# Patient Record
Sex: Female | Born: 1980 | Race: Black or African American | Hispanic: No | Marital: Single | Smoking: Never smoker
Health system: Southern US, Community
[De-identification: ages and names within clinical notes are randomized; demographics above are authoritative.]

## PROBLEM LIST (undated history)

## (undated) DIAGNOSIS — G932 Benign intracranial hypertension: Secondary | ICD-10-CM

## (undated) DIAGNOSIS — N189 Chronic kidney disease, unspecified: Secondary | ICD-10-CM

## (undated) DIAGNOSIS — I10 Essential (primary) hypertension: Secondary | ICD-10-CM

---

## 2011-10-07 ENCOUNTER — Inpatient Hospital Stay
Admit: 2011-10-07 | Discharge: 2011-10-09 | Disposition: A | Discharge status: Home / Self Care | Payer: BLUE CROSS/BLUE SHIELD | Attending: Obstetrics & Gynecology | Admitting: Obstetrics & Gynecology

## 2011-10-07 DIAGNOSIS — N730 Acute parametritis and pelvic cellulitis: Secondary | ICD-10-CM

## 2011-10-07 LAB — METABOLIC PANEL, COMPREHENSIVE
A-G Ratio: 1.3 (ref 0.8–1.7)
ALT (SGPT): 21 U/L (ref 12.0–78.0)
AST (SGOT): 12 U/L — ABNORMAL LOW (ref 15–37)
Albumin: 4.1 g/dL (ref 3.4–5.0)
Alk. phosphatase: 109 U/L (ref 50–136)
Anion gap: 10 mmol/L (ref 3.0–18)
BUN/Creatinine ratio: 9 — ABNORMAL LOW (ref 12–20)
BUN: 12 MG/DL (ref 7.0–18)
Bilirubin, total: 0.4 MG/DL (ref 0.2–1.0)
CO2: 28 MMOL/L (ref 21–32)
Calcium: 9 MG/DL (ref 8.5–10.1)
Chloride: 100 MMOL/L (ref 100–108)
Creatinine: 1.33 MG/DL — ABNORMAL HIGH (ref 0.6–1.3)
GFR est AA: >60 mL/min/{1.73_m2} (ref >60)
GFR est non-AA: 50 mL/min/{1.73_m2} — ABNORMAL LOW (ref >60)
Globulin: 3.2 g/dL (ref 2.0–4.0)
Glucose: 104 MG/DL — ABNORMAL HIGH (ref 74–99)
Potassium: 3.1 MMOL/L — ABNORMAL LOW (ref 3.5–5.5)
Protein, total: 7.3 g/dL (ref 6.4–8.2)
Sodium: 138 MMOL/L (ref 136–145)

## 2011-10-07 LAB — CBC WITH AUTOMATED DIFF
ABS. BASOPHILS: 0 10*3/uL (ref 0.0–0.06)
ABS. EOSINOPHILS: 0 10*3/uL (ref 0.0–0.4)
ABS. LYMPHOCYTES: 2.2 10*3/uL (ref 0.9–3.6)
ABS. MONOCYTES: 1.6 10*3/uL — ABNORMAL HIGH (ref 0.05–1.2)
ABS. NEUTROPHILS: 20.6 10*3/uL — ABNORMAL HIGH (ref 1.8–8.0)
BASOPHILS: 0 % (ref 0–2)
EOSINOPHILS: 0 % (ref 0–5)
HCT: 34 % — ABNORMAL LOW (ref 35.0–45.0)
HGB: 11.5 g/dL — ABNORMAL LOW (ref 12.0–16.0)
LYMPHOCYTES: 9 % — ABNORMAL LOW (ref 21–52)
MCH: 26.4 PG (ref 24.0–34.0)
MCHC: 33.8 g/dL (ref 31.0–37.0)
MCV: 78 FL (ref 74.0–97.0)
MONOCYTES: 7 % (ref 3–10)
MPV: 9.5 FL (ref 9.2–11.8)
NEUTROPHILS: 84 % — ABNORMAL HIGH (ref 40–73)
PLATELET: 292 10*3/uL (ref 135–420)
RBC: 4.36 M/uL (ref 4.20–5.30)
RDW: 14.5 % (ref 11.6–14.5)
WBC: 24.5 10*3/uL — ABNORMAL HIGH (ref 4.6–13.2)

## 2011-10-07 LAB — URINALYSIS W/ RFLX MICROSCOPIC
Bilirubin: NEGATIVE
Blood: NEGATIVE
Glucose: NEGATIVE MG/DL
Ketone: NEGATIVE MG/DL
Leukocyte Esterase: NEGATIVE
Nitrites: NEGATIVE
Specific gravity: 1.02 (ref 1.003–1.030)
Urobilinogen: 1 EU/DL (ref 0.2–1.0)
pH (UA): 6 (ref 5.0–8.0)

## 2011-10-07 LAB — WET PREP

## 2011-10-07 LAB — URINE MICROSCOPIC ONLY
RBC: 0 /HPF (ref 0–5)
WBC: 0 /HPF (ref 0–4)

## 2011-10-07 LAB — LACTIC ACID: Lactic acid: 0.4 MMOL/L (ref 0.4–2.0)

## 2011-10-07 LAB — HCG URINE, QL: HCG urine, QL: NEGATIVE

## 2011-10-07 MED ORDER — ONDANSETRON (PF) 4 MG/2 ML INJECTION
4 mg/2 mL | INTRAMUSCULAR | Status: AC
Start: 2011-10-07 — End: 2011-10-07
  Administered 2011-10-07: 10:00:00 via INTRAVENOUS

## 2011-10-07 MED ORDER — IBUPROFEN 400 MG TAB
400 mg | ORAL | Status: DC | PRN
Start: 2011-10-07 — End: 2011-10-09
  Administered 2011-10-08 (×2): via ORAL

## 2011-10-07 MED ORDER — MORPHINE 4 MG/ML SYRINGE
4 mg/mL | INTRAMUSCULAR | Status: AC
Start: 2011-10-07 — End: 2011-10-07
  Administered 2011-10-07: 10:00:00 via INTRAVENOUS

## 2011-10-07 MED ORDER — GENTAMICIN 40 MG/ML IJ SOLN
40 mg/mL | INTRAMUSCULAR | Status: DC
Start: 2011-10-07 — End: 2011-10-09
  Administered 2011-10-08: 12:00:00 via INTRAVENOUS

## 2011-10-07 MED ORDER — HYDROMORPHONE (PF) 1 MG/ML IJ SOLN
1 mg/mL | Freq: Once | INTRAMUSCULAR | Status: AC
Start: 2011-10-07 — End: 2011-10-07
  Administered 2011-10-07: 15:00:00 via INTRAVENOUS

## 2011-10-07 MED ORDER — SODIUM CHLORIDE 0.9 % IV
INTRAVENOUS | Status: DC
Start: 2011-10-07 — End: 2011-10-09
  Administered 2011-10-07 – 2011-10-08 (×3): via INTRAVENOUS

## 2011-10-07 MED ORDER — SODIUM CHLORIDE 0.9% BOLUS IV
0.9 % | INTRAVENOUS | Status: AC
Start: 2011-10-07 — End: 2011-10-07
  Administered 2011-10-07: 08:00:00 via INTRAVENOUS

## 2011-10-07 MED ORDER — MORPHINE 4 MG/ML SYRINGE
4 mg/mL | INTRAMUSCULAR | Status: AC
Start: 2011-10-07 — End: 2011-10-07
  Administered 2011-10-07: 08:00:00 via INTRAVENOUS

## 2011-10-07 MED ORDER — ONDANSETRON (PF) 4 MG/2 ML INJECTION
4 mg/2 mL | INTRAMUSCULAR | Status: DC | PRN
Start: 2011-10-07 — End: 2011-10-09

## 2011-10-07 MED ORDER — SODIUM CHLORIDE 0.9 % IV PIGGY BACK
100 mg | Freq: Two times a day (BID) | INTRAVENOUS | Status: DC
Start: 2011-10-07 — End: 2011-10-09
  Administered 2011-10-07 – 2011-10-09 (×3): via INTRAVENOUS

## 2011-10-07 MED ORDER — ONDANSETRON (PF) 4 MG/2 ML INJECTION
4 mg/2 mL | INTRAMUSCULAR | Status: AC
Start: 2011-10-07 — End: 2011-10-07
  Administered 2011-10-07: 08:00:00 via INTRAVENOUS

## 2011-10-07 MED ORDER — OXYCODONE-ACETAMINOPHEN 5 MG-325 MG TAB
5-325 mg | ORAL | Status: DC | PRN
Start: 2011-10-07 — End: 2011-10-09
  Administered 2011-10-08 – 2011-10-09 (×3): via ORAL

## 2011-10-07 MED ORDER — IOHEXOL 240 MG/ML IV SOLN
240 mg iodine/mL | Freq: Once | INTRAVENOUS | Status: AC
Start: 2011-10-07 — End: 2011-10-07
  Administered 2011-10-07: 15:00:00 via ORAL

## 2011-10-07 MED ORDER — GENTAMICIN 40 MG/ML IJ SOLN
40 mg/mL | Freq: Once | INTRAMUSCULAR | Status: AC
Start: 2011-10-07 — End: 2011-10-07
  Administered 2011-10-07: 11:00:00 via INTRAVENOUS

## 2011-10-07 MED ORDER — ONDANSETRON (PF) 4 MG/2 ML INJECTION
4 mg/2 mL | INTRAMUSCULAR | Status: AC
Start: 2011-10-07 — End: 2011-10-07
  Administered 2011-10-07: 15:00:00 via INTRAVENOUS

## 2011-10-07 MED ORDER — IOVERSOL 320 MG/ML IV SYRINGE
320 mg iodine/mL | Freq: Once | INTRAVENOUS | Status: AC
Start: 2011-10-07 — End: 2011-10-07
  Administered 2011-10-07: 18:00:00 via INTRAVENOUS

## 2011-10-07 MED ORDER — PHARMACY GENTAMICIN NOTE
Freq: Once | Status: DC
Start: 2011-10-07 — End: 2011-10-09

## 2011-10-07 MED ORDER — SODIUM CHLORIDE 0.9 % IJ SYRG
INTRAMUSCULAR | Status: DC | PRN
Start: 2011-10-07 — End: 2011-10-09

## 2011-10-07 MED ORDER — SODIUM CHLORIDE 0.9 % IV
Freq: Once | INTRAVENOUS | Status: AC
Start: 2011-10-07 — End: 2011-10-07
  Administered 2011-10-07: 15:00:00 via INTRAVENOUS

## 2011-10-07 MED ORDER — PHARMACY GENTAMICIN NOTE
Freq: Once | Status: DC
Start: 2011-10-07 — End: 2011-10-07

## 2011-10-07 MED ORDER — CLINDAMYCIN 900 MG/6 ML IV
9006 mg/6 mL | INTRAVENOUS | Status: AC
Start: 2011-10-07 — End: 2011-10-07
  Administered 2011-10-07: 09:00:00 via INTRAVENOUS

## 2011-10-07 MED FILL — HYDROMORPHONE (PF) 1 MG/ML IJ SOLN: 1 mg/mL | INTRAMUSCULAR | Qty: 1

## 2011-10-07 MED FILL — CLINDAMYCIN 900 MG/6 ML IV: 900 mg/6 mL | INTRAVENOUS | Qty: 6

## 2011-10-07 MED FILL — OMNIPAQUE 240 MG IODINE/ML INTRAVENOUS SOLUTION: 240 mg iodine/mL | INTRAVENOUS | Qty: 50

## 2011-10-07 MED FILL — BD POSIFLUSH NORMAL SALINE 0.9 % INJECTION SYRINGE: INTRAMUSCULAR | Qty: 10

## 2011-10-07 MED FILL — ONDANSETRON (PF) 4 MG/2 ML INJECTION: 4 mg/2 mL | INTRAMUSCULAR | Qty: 2

## 2011-10-07 MED FILL — GENTAMICIN 40 MG/ML IJ SOLN: 40 mg/mL | INTRAMUSCULAR | Qty: 10

## 2011-10-07 MED FILL — MORPHINE 4 MG/ML SYRINGE: 4 mg/mL | INTRAMUSCULAR | Qty: 1

## 2011-10-07 MED FILL — SODIUM CHLORIDE 0.9 % IV: INTRAVENOUS | Qty: 1000

## 2011-10-07 MED FILL — DOXY-100 100 MG INTRAVENOUS SOLUTION: 100 mg | INTRAVENOUS | Qty: 100

## 2011-10-07 MED FILL — PHARMACY GENTAMICIN NOTE: Qty: 1

## 2011-10-07 MED FILL — SODIUM CHLORIDE 0.9 % IV: INTRAVENOUS | Qty: 100

## 2011-10-07 MED FILL — OPTIRAY 320 MG IODINE/ML INTRAVENOUS SYRINGE: 320 mg iodine/mL | INTRAVENOUS | Qty: 125

## 2011-10-07 NOTE — Progress Notes (Signed)
1340 received pt from ER. Pt A&O, but sleey, resp-clear, abdomen soft, non-tender, hypoactive bowel sounds (very faint), moves all extremities well, equal strength in both hands/feet, cap refill<3 secs; pt denies pain at this time.Call bell within reach. Cont. to monitor. Family at bedside.  1530 Pt unchanged except pt is sleeping. Cont to monitor.  1830-unchanged except pt's physician came and spoke with her about the plan for her care.

## 2011-10-07 NOTE — Progress Notes (Signed)
1435 back from CT scan was brought from ER to floor then to CT scan alert and oriented still drowsy she said was given pain medicine  While she was in ER.  1850 tolerated regular diet, did not ask anything for pain, looks comfortable.

## 2011-10-07 NOTE — ED Notes (Signed)
Patient has completed oral contrast

## 2011-10-07 NOTE — Progress Notes (Signed)
Lengthy conversation with pt and her father about lab results, [place], need for CT and to stay in hosp for 24 hour obs.  They voice understanding and agreement

## 2011-10-07 NOTE — Progress Notes (Signed)
Bedside and Verbal shift change report given to Loel Lofty, RN (oncoming nurse) by Redmond Pulling, RN   (offgoing nurse).  Report given with SBAR, Kardex, Intake/Output, MAR and Recent Results.

## 2011-10-07 NOTE — Other (Signed)
TRANSFER - OUT REPORT:    Verbal report given to [facility] Carrizozo Armstrong(name) on Bethany Keller  being transferred to 2200(unit) for routine progression of care       Report consisted of patient???s Situation, Background, Assessment and   Recommendations(SBAR).     Information from the following report(s) SBAR, ED Summary, Procedure Summary and MAR was reviewed with the receiving nurse.    Opportunity for questions and clarification was provided.

## 2011-10-07 NOTE — H&P (Signed)
Reesa Chew, MD  [facility]  [address]  [address]  [place], [place] [address]  [phone]       Gynecology Consult Note    Patient: Bethany Keller   Age:  31 y.o.  Consulting Physician: Felix Ahmadi, MD  Reason for Consult: Pelvic pain    Chief complaint:  Pain and vaginal bleeding    Bethany Keller is a 31yo G0 who presented to the ER with pelvic pain and vaginal bleeding. Reports that pain started 2-3 days ago and is bilateral. Having vaginal spotting. C/o some nausea but no vomiting. Denies fever or chills. Eating and drinking normally.     Past Medical History:  History reviewed. No pertinent past medical history.    Past Surgical History:  History reviewed. No pertinent past surgical history.    Family History:  History reviewed. No pertinent family history.    Social History:  History     Social History   ??? Marital Status: SINGLE     Spouse Name: N/A     Number of Children: N/A   ??? Years of Education: N/A     Social History Main Topics   ??? Smoking status: Never Smoker    ??? Smokeless tobacco: Not on file   ??? Alcohol Use: Yes      socially   ??? Drug Use:    ??? Sexually Active: Yes -- Female partner(s)     Birth Control/ Protection: Condom     Other Topics Concern   ??? Not on file     Social History Narrative   ??? No narrative on file     Gyn history: says she has a h/o PID in the past. No abnormal pap smears    Home Medications:  Prior to Admission medications    Not on File       Allergies:  No Known Allergies    Review of Systems  A comprehensive review of systems was negative except for that written in the History of Present Illness.  remainder of ten point review of systems reviewed with patient and negative      Physical Exam:     Visit Vitals   Item Reading   ??? BP 163/119   ??? Pulse 111   ??? Temp 99.7 ??F (37.6 ??C)   ??? Resp 16   ??? Ht 5\' 5"  (1.651 m)   ??? Wt 81.647 kg (180 lb)   ??? BMI 29.95 kg/m2   ??? SpO2 100%       Physical Exam:  General appearance: alert, cooperative, no distress, appears stated  age  Heart: RRR  Lungs: clear  Abd: diffusely tender across lower pelvis; no rebound or guarding  Pelvic: Uterine tenderness and cervical motion tenderness appreciated  Ext: nontender, no edema    Pelvic ultrasound:  No TOA but hydrasalpinx seen    Lab/Data Reviewed:  Recent Results (from the past 24 hour(s))   CBC WITH AUTOMATED DIFF    Collection Time    10/07/11  3:30 AM       Component Value Range    WBC 24.5 (*) 4.6 - 13.2 K/uL    RBC 4.36  4.20 - 5.30 M/uL    HGB 11.5 (*) 12.0 - 16.0 g/dL    HCT 91.4 (*) 78.2 - 45.0 %    MCV 78.0  74.0 - 97.0 FL    MCH 26.4  24.0 - 34.0 PG    MCHC 33.8  31.0 - 37.0  g/dL    RDW 16.1  09.6 - 04.5 %    PLATELET 292  135 - 420 K/uL    MPV 9.5  9.2 - 11.8 FL    NEUTROPHILS 84 (*) 40 - 73 %    LYMPHOCYTES 9 (*) 21 - 52 %    MONOCYTES 7  3 - 10 %    EOSINOPHILS 0  0 - 5 %    BASOPHILS 0  0 - 2 %    ABS. NEUTROPHILS 20.6 (*) 1.8 - 8.0 K/UL    ABS. LYMPHOCYTES 2.2  0.9 - 3.6 K/UL    ABS. MONOCYTES 1.6 (*) 0.05 - 1.2 K/UL    ABS. EOSINOPHILS 0.0  0.0 - 0.4 K/UL    ABS. BASOPHILS 0.0  0.0 - 0.06 K/UL    DF AUTOMATED     METABOLIC PANEL, COMPREHENSIVE    Collection Time    10/07/11  3:30 AM       Component Value Range    Sodium 138  136 - 145 MMOL/L    Potassium 3.1 (*) 3.5 - 5.5 MMOL/L    Chloride 100  100 - 108 MMOL/L    CO2 28  21 - 32 MMOL/L    Anion gap 10  3.0 - 18 mmol/L    Glucose 104 (*) 74 - 99 MG/DL    BUN 12  7.0 - 18 MG/DL    Creatinine 4.09 (*) 0.6 - 1.3 MG/DL    BUN/Creatinine ratio 9 (*) 12 - 20      GFR est AA >60  >60 ml/min/1.82m2    GFR est non-AA 50 (*) >60 ml/min/1.4m2    Calcium 9.0  8.5 - 10.1 MG/DL    Bilirubin, total 0.4  0.2 - 1.0 MG/DL    ALT 21  81.1 - 91.4 U/L    AST 12 (*) 15 - 37 U/L    Alk. phosphatase 109  50 - 136 U/L    Protein, total 7.3  6.4 - 8.2 g/dL    Albumin 4.1  3.4 - 5.0 g/dL    Globulin 3.2  2.0 - 4.0 g/dL    A-G Ratio 1.3  0.8 - 1.7     URINALYSIS W/ RFLX MICROSCOPIC    Collection Time    10/07/11  3:30 AM       Component Value Range    Color  YELLOW      Appearance CLEAR      Specific gravity 1.020  1.003 - 1.030      pH 6.0  5.0 - 8.0      Protein TRACE (*) NEGATIVE MG/DL    Glucose NEGATIVE   NEGATIVE MG/DL    Ketone NEGATIVE   NEGATIVE MG/DL    Bilirubin NEGATIVE   NEGATIVE    Blood NEGATIVE   NEGATIVE    Urobilinogen 1.0  0.2 - 1.0 EU/DL    Nitrites NEGATIVE   NEGATIVE    Leukocyte Esterase NEGATIVE   NEGATIVE   HCG URINE, QL    Collection Time    10/07/11  3:30 AM       Component Value Range    HCG urine, Ql. NEGATIVE   NEGATIVE   WET PREP    Collection Time    10/07/11  3:30 AM       Component Value Range    Specimen Description: CERVICAL      Special Requests: NO SPECIAL REQUESTS      Wet prep NO YEAST,TRICHOMONAS OR CLUE CELLS  NOTED      Report Status 10/07/2011 FINAL     URINE MICROSCOPIC ONLY    Collection Time    10/07/11  3:30 AM       Component Value Range    WBC 0 to 3  0 - 4 /HPF    RBC 0  0 - 5 /HPF    Epithelial cells 1+  0 - 5 /LPF   LACTIC ACID, PLASMA    Collection Time    10/07/11  4:40 AM       Component Value Range    Lactic acid 0.4  0.4 - 2.0 MMOL/L   CULTURE, BLOOD    Collection Time    10/07/11  4:40 AM       Component Value Range    Specimen Description: BLOOD      Special Requests: NO SPECIAL REQUESTS      Culture result: NO GROWTH AFTER 3 HOURS      Report Status PENDING     CULTURE, BLOOD    Collection Time    10/07/11  4:45 AM       Component Value Range    Specimen Description: BLOOD      Special Requests: NO SPECIAL REQUESTS      Culture result: NO GROWTH AFTER 3 HOURS      Report Status PENDING             Assessment/Plan     Pelvic pain and elevated WBC count  C/w possible PID. Gent and clinda given. Pelvic US without TOA. Plan for CT abd/pelvis to r/o appendicitis. If no appy, will admit to gyn for treatment of presumed PID.    Reesa Chew, MD  Oct 07, 2011

## 2011-10-07 NOTE — ED Notes (Signed)
Sleeping with friend at the bedside."feels better."

## 2011-10-07 NOTE — Other (Signed)
TRANSFER - IN REPORT:    Verbal report received from Milton Ferguson (name) on Bethany Keller  being received from ER(unit) for routine progression of care      Report consisted of patient???s Situation, Background, Assessment and   Recommendations(SBAR).     Information from the following report(s) SBAR, Kardex, Intake/Output, MAR and Recent Results was reviewed with the receiving nurse.    Opportunity for questions and clarification was provided.      Assessment completed upon patient???s arrival to unit and care assumed.

## 2011-10-07 NOTE — Progress Notes (Signed)
Clcr = 65 ml/min; calculated 5 mg/kg dose based on patient's adjusted body weight = 335 mg intravenously starting tomorrow since the patient already received a 350 mg dose in the ED this morning.  Ordered a random gentamicin level for 09 Oct 2011.

## 2011-10-07 NOTE — Progress Notes (Signed)
GYN PROGRESS NOTE    S:  Pt denies pain.  Feeling better.  Voiding well.  Hungry and ate a sub.  O:  Patient Vitals for the past 4 hrs:   Temp Pulse Resp BP SpO2   10/07/11 1524 98 ??F (36.7 ??C) 97  20  168/80 mmHg 97 %             Intake/Output Summary (Last 24 hours) at 10/07/11 1810  Last data filed at 10/07/11 1749   Gross per 24 hour   Intake 558.33 ml   Output    500 ml   Net  58.33 ml        A:PID and hydrosalpinx.  On antibiotics.  Afebrile        P: Continue Regular diet and IV antibiotics.  Recheck CBC in am and watch for resolution of abdominal and pelvic pain.  Questions answered.       Kyung Rudd, MD  Oct 07, 2011

## 2011-10-07 NOTE — ED Provider Notes (Signed)
HPI Comments: 31 yo F presents to the ED c/o vaginal bleeding and discharge since yesterday.  Pt also states that she has had nausea and severe suprapubic pain.  Pt's LMP:  3 weeks ago.  Pt state that she had PID in the past, and that her sx's feel the similar to then.  Pt denies fever, chills, V/D, CP, SOB, HA, dizziness, and any other sx or complaints.       History reviewed. No pertinent past medical history.     History reviewed. No pertinent past surgical history.      History reviewed. No pertinent family history.     History     Social History   ??? Marital Status: SINGLE     Spouse Name: N/A     Number of Children: N/A   ??? Years of Education: N/A     Occupational History   ??? Not on file.     Social History Main Topics   ??? Smoking status: Never Smoker    ??? Smokeless tobacco: Not on file   ??? Alcohol Use: Yes      socially   ??? Drug Use:    ??? Sexually Active: Yes -- Female partner(s)     Birth Control/ Protection: Condom     Other Topics Concern   ??? Not on file     Social History Narrative   ??? No narrative on file                  ALLERGIES: Review of patient's allergies indicates no known allergies.      Review of Systems   Constitutional: Negative.  Negative for fever, chills and diaphoresis.   HENT: Negative.  Negative for congestion, sore throat, trouble swallowing, neck pain and neck stiffness.    Eyes: Negative.  Negative for photophobia, pain and redness.   Respiratory: Negative.  Negative for cough, chest tightness, shortness of breath and wheezing.    Cardiovascular: Negative.  Negative for chest pain and palpitations.   Gastrointestinal: Positive for nausea and abdominal pain. Negative for vomiting, diarrhea and blood in stool.   Genitourinary: Positive for vaginal bleeding and vaginal discharge. Negative for dysuria, frequency and difficulty urinating.   Musculoskeletal: Negative.  Negative for myalgias and arthralgias.   Skin: Negative.    Neurological: Negative.  Negative for dizziness, tremors,  seizures, syncope, speech difficulty, light-headedness and headaches.   Hematological: Negative.    Psychiatric/Behavioral: Negative.  Negative for confusion. The patient is not nervous/anxious.    All other systems reviewed and are negative.        Filed Vitals:    10/07/11 0228   BP: 181/126   Pulse: 111   Temp: 99.7 ??F (37.6 ??C)   Resp: 16   Height: 5\' 5"  (1.651 m)   Weight: 81.647 kg (180 lb)   SpO2: 98%            Physical Exam   Constitutional: She appears well-developed. No distress.   HENT:   Head: Atraumatic.   Right Ear: External ear normal.   Left Ear: External ear normal.   Mouth/Throat: Oropharynx is clear and moist.   Eyes: Conjunctivae and EOM are normal.   Neck: Normal range of motion. Neck supple. No tracheal deviation present.   Cardiovascular: Normal rate, regular rhythm and normal heart sounds.    Pulmonary/Chest: Effort normal and breath sounds normal. No stridor. No respiratory distress. She has no wheezes. She has no rales.   Abdominal: Soft. Bowel sounds  are normal. There is tenderness in the suprapubic area. There is rebound and guarding.   Genitourinary: Uterus is tender. Cervix exhibits motion tenderness (severe). Vaginal discharge (yellow) found.        Mild ovarian tenderness bilaterally.     Musculoskeletal: Normal range of motion. She exhibits no edema and no tenderness.   Lymphadenopathy:     She has no cervical adenopathy.   Neurological: She is alert. No cranial nerve deficit. She exhibits normal muscle tone. Coordination normal.   Skin: Skin is warm and dry. No rash noted. She is not diaphoretic.   Psychiatric: She has a normal mood and affect. Thought content normal.        MDM     Differential Diagnosis; Clinical Impression; Plan:     4:16 AM:  Suspect PID, r/o ectopic, ovarian rupture or cyst.  5:01 AM WBC UP, TALKED WITH DR GOLPIRA WHO WILL SEE PT AFTER HER 1ST CASE THIS AM, TALKED WITH CT, THEY CANT CALL TECH IN NOW BUT SAY WILL GET HERE AT 0630 AND MAKE THIS PT 1ST CASE, R/O  TOA. DR Rhae Hammock SUGGESTS CLEOCIN AND GENTAMICIN ("PHARMACY TO DOSE") GAVE MORPHINE, REPEATING NOW. SHE FEELS PT WILL BE INPT.  5:52 AM MORPHINE DELAYED, [place] SHOULD BE SOON,   6:23 AM PAIN BETTER, ABX GOING, [place] PENDING, DR DICKSON WILL ASSUME CARE AND CALL DR Rhae Hammock AFTER [place]  Amount and/or Complexity of Data Reviewed:   Clinical lab tests:  Ordered and reviewed   Decide to obtain previous medical records or to obtain history from someone other than the patient:  Yes   Obtain history from someone other than the patient:  Yes   Review and summarize past medical records:  Yes   Discuss the patient with another provider:  Yes  Risk of Significant Complications, Morbidity, and/or Mortality:   Presenting problems:  Moderate  Diagnostic procedures:  Moderate  Management options:  Moderate  Progress:   Patient progress:  Stable      Procedures    -------------------------------------------------------------------------------------------------------------------     EKG INTERPRETATIONS:      RESULTS:       CONSULTATIONS:      PROGRESS NOTES:      SCRIBE ATTESTATION STATEMENT:  4:17 AM  Provider documentation written by: Avelina Laine  Acting as a scribe for Dr. Felix Ahmadi, MD ED Provider     I have reviewed the information recorded by the scribe and agree with its contents.    -------------------------------------------------------------------------------------------------------------------    I have reviewed the information recorded by the scribe and agree with its contents. Reymond Maynez  IN ADDITION, I HAVE REVIEWED ANY PAST-FAMILY-SOCIAL HX (AS PER "BEST PRACTICE"), ALL PTs WITH ANY ELEVATED BP READINGS HAVE BEEN TOLD TO FOLLOW WITH THEIR PCP, ALL PTS TOLD MAKE SURE LEAVE PROPER PHONE NUMBERS, AND ALL CRITICAL CARE TIMES GIVEN (IF APPLICAPLE) DO NOT INCLUDE ANY PROCEDURE TIME IN THE ESTIMATE.  BP UP-TOLD F/U PCP  Diagnosis:   1. PID (acute pelvic inflammatory disease)    2. Pelvic pain in female    3. Elevated BP           Disposition: PER DR DICKSON    Follow-up Information     None          Patient's Medications    No medications on file

## 2011-10-08 LAB — CBC WITH AUTOMATED DIFF
ABS. BASOPHILS: 0 10*3/uL (ref 0.0–0.06)
ABS. EOSINOPHILS: 0.1 10*3/uL (ref 0.0–0.4)
ABS. LYMPHOCYTES: 2.4 10*3/uL (ref 0.9–3.6)
ABS. MONOCYTES: 0.9 10*3/uL (ref 0.05–1.2)
ABS. NEUTROPHILS: 11.6 10*3/uL — ABNORMAL HIGH (ref 1.8–8.0)
BASOPHILS: 0 % (ref 0–2)
EOSINOPHILS: 1 % (ref 0–5)
HCT: 31.5 % — ABNORMAL LOW (ref 35.0–45.0)
HGB: 10.6 g/dL — ABNORMAL LOW (ref 12.0–16.0)
LYMPHOCYTES: 16 % — ABNORMAL LOW (ref 21–52)
MCH: 26.5 PG (ref 24.0–34.0)
MCHC: 33.7 g/dL (ref 31.0–37.0)
MCV: 78.8 FL (ref 74.0–97.0)
MONOCYTES: 6 % (ref 3–10)
MPV: 10.4 FL (ref 9.2–11.8)
NEUTROPHILS: 77 % — ABNORMAL HIGH (ref 40–73)
PLATELET: 281 10*3/uL (ref 135–420)
RBC: 4 M/uL — ABNORMAL LOW (ref 4.20–5.30)
RDW: 14.5 % (ref 11.6–14.5)
WBC: 15 10*3/uL — ABNORMAL HIGH (ref 4.6–13.2)

## 2011-10-08 LAB — CHLAMYDIA/GC PCR
Chlamydia amplified: NEGATIVE
N. gonorrhea, amplified: NEGATIVE

## 2011-10-08 MED ORDER — METOPROLOL TARTRATE 5 MG/5 ML IV SOLN
5 mg/ mL | INTRAVENOUS | Status: DC | PRN
Start: 2011-10-08 — End: 2011-10-09
  Administered 2011-10-08 – 2011-10-09 (×3): via INTRAVENOUS

## 2011-10-08 MED ORDER — CLONIDINE 0.1 MG TAB
0.1 mg | Freq: Three times a day (TID) | ORAL | Status: DC
Start: 2011-10-08 — End: 2011-10-09
  Administered 2011-10-09: 01:00:00 via ORAL

## 2011-10-08 MED ORDER — METOPROLOL TARTRATE 25 MG TAB
25 mg | Freq: Two times a day (BID) | ORAL | Status: DC
Start: 2011-10-08 — End: 2011-10-09
  Administered 2011-10-08 – 2011-10-09 (×3): via ORAL

## 2011-10-08 MED ORDER — LABETALOL 5 MG/ML IV SOLN
5 mg/mL | Freq: Once | INTRAVENOUS | Status: AC
Start: 2011-10-08 — End: 2011-10-08
  Administered 2011-10-08: 07:00:00 via INTRAVENOUS

## 2011-10-08 MED FILL — IBUPROFEN 400 MG TAB: 400 mg | ORAL | Qty: 1

## 2011-10-08 MED FILL — METOPROLOL TARTRATE 5 MG/5 ML IV SOLN: 5 mg/ mL | INTRAVENOUS | Qty: 5

## 2011-10-08 MED FILL — LABETALOL 5 MG/ML IV SOLN: 5 mg/mL | INTRAVENOUS | Qty: 20

## 2011-10-08 MED FILL — METOPROLOL TARTRATE 25 MG TAB: 25 mg | ORAL | Qty: 1

## 2011-10-08 MED FILL — OXYCODONE-ACETAMINOPHEN 5 MG-325 MG TAB: 5-325 mg | ORAL | Qty: 1

## 2011-10-08 MED FILL — SODIUM CHLORIDE 0.9 % IV: INTRAVENOUS | Qty: 100

## 2011-10-08 MED FILL — DOXY-100 100 MG INTRAVENOUS SOLUTION: 100 mg | INTRAVENOUS | Qty: 100

## 2011-10-08 NOTE — Progress Notes (Signed)
0128: BP 185/123.  Rechecked an hour later, BP 188/125.  Paged Dr. Durwin Nora, awaits a call back.               Pt also c/o of headache, motrin 400 mg.    0245:  Dr. Durwin Nora ordered 10 mg Labetalol.  0300:  Med administered, pt A&O, no sign of distress.  Will continue to monitor.

## 2011-10-08 NOTE — Consults (Signed)
[id number]

## 2011-10-08 NOTE — Consults (Signed)
[facility]                  [address], [place], [place]  [address]                                CONSULTATION REPORT    PATIENT:      Bethany Keller, Bethany Keller  MRN:              [id number]     ADMITTED:   10/07/2011  BILLING:          [id number]    CONSULTED:  10/08/2011  LOCATION:     [id number]  ATTENDING:    Cherlyn Roberts, MD  CONSULTINGAdonis Brook, MD        MEDICINE CONSULTATION    REASON FOR CONSULTATION: Hypertension.    REFERRING PHYSICIAN: Guss Bunde, MD    CONSULTING PHYSICIAN: Adonis Brook, MD    HISTORY OF PRESENT ILLNESS: The patient was admitted for pelvic  inflammatory disease. She is still in significant pain, not using pain  medications. She has not been vomiting. She is eating well, according to  Dr. Durwin Nora and the patient. The patient has a history of elevated blood  pressure with systolics being in the 150, according to history. Plan per  Dr. Durwin Nora is to monitor one more day and then to discharge home on p.o.  antibiotics. The patient has an appointment for primary care on Friday  10/10/2011. Boyfriend is at the bedside and states that he is supportive  and will make sure that she makes it to the appointment. She is still  describing her pain as 7/10 in the left lower quadrant. Per CT of the  abdomen and pelvis, she has right greater than left fluid distention. No  visualized tubo-ovarian abscess was present. The patient states that she is  reliable, wishes to follow up and agrees to get an outpatient blood  pressure cuff to monitor her blood pressure twice a day.    PRIMARY CARE PHYSICIAN: The patient has an appointment. She cannot remember  the name of the physician. However, this appointment is on Friday,  10/10/2011.    PAST MEDICAL HISTORY: Previous history of PID. By history, elevated blood  pressure up to 150 systolic.    CURRENT MEDICATIONS: None.    ALLERGIES: NO KNOWN DRUG ALLERGIES.    SOCIAL HISTORY: She is independent  for ADLs and IADLs. She denies tobacco.  She drinks alcohol socially. She denies street drugs.    CODE STATUS: FULL CODE.    FAMILY HISTORY: Positive for hypertension in her grandmother also with  diabetes.    REVIEW OF SYSTEMS  GENERAL: Last night, she had a headache but currently is headache free.  HEENT: No change in vision or hearing.  RESPIRATORY: No cough, sputum production, wheezing or dyspnea on exertion.  CARDIOVASCULAR: No chest pain or dizziness. No palpitations.  GASTROINTESTINAL: Positive for abdominal pain in the left lower quadrant.  Her nausea is resolving. She has been able to eat a sub sandwich.  GENITOURINARY/RECTAL: The patient denies any dyspareunia.  MUSCULOSKELETAL: No arthralgias or myalgias.  NEUROLOGIC: She has no focalized or generalized weakness. Positive headache  last night but has resolved. No changes in vision, hearing or gait.    PHYSICAL EXAMINATION  VITAL SIGNS: Blood pressure 154/110, pulse is 90 but has been high as 110.  Temperature is 97.8, respirations are 18,  O2 saturation is 97% on 2 L nasal  cannula. Weight is 81 kg.  GENERAL: She is in mild distress due to pain.  HEENT: Within normal limits.  NECK: No JVD, thyromegaly, bruits or lymphadenopathy.  CHEST: Clear to auscultation.  HEART: Regular rate and rhythm although borderline tachycardic hovering  around 100. No gallops, rubs or murmurs.  ABDOMEN: Soft, mild tenderness with very mild guarding in the left lower  quadrant. Bowel sounds are regular. No hepatosplenomegaly is present.  GENITOURINARY/RECTAL: Deferred.  EXTREMITIES: No clubbing, cyanosis or edema.    LABORATORY DATA: White blood cell count is 15.0, hemoglobin is 10.6,  hematocrit 31.5, platelets 281. Differential is unremarkable. Basic  metabolic panel and liver function tests are essentially within normal  limits with the exception of mildly low potassium at 3.1. Lactic acid level  0.4. HCG urine is negative. CAT scan shows results as above.    ASSESSMENT AND  PLAN  1. Hypertension. The likelihood is that she has underlying hypertension  that is moderate with acute exacerbation due to elevation of pain due to  her tachycardia. We will start her on metoprolol 12.5 mg p.o. b.i.d. with  Lopressor 2.5 mg IV every 4 hours as needed. The patient likely needs to  continue with blood pressure medication with close followup. She has been  given instructions to obtain blood pressure cuff and to follow up with her  primary care physician. She currently is symptom free in terms of headache  so this should not preclude her from discharge. We will follow tomorrow and  the anticipation is that the patient should be able to discharge home.  2. Tubal ovarian abscess management as per OB/GYN, Dr. Durwin Nora. Continue with  antibiotics. Per her notes, anticipation again is that she will likely be  able to discharge tomorrow.  3. Code status is FULL CODE.    I would like to thank Dr. Durwin Nora for allowing me to provide care for the  patient.                                                                   Adonis Brook, MD        RB:wmx  D: 10/08/2011  9:35 A T: 10/08/2011 10:27 A  Job #:  [id number]  CScriptDoc #:  [id number]  cc:   Adonis Brook, MD        Kyung Rudd, MD        Cherlyn Roberts, MD

## 2011-10-08 NOTE — Progress Notes (Signed)
GYN PROGRESS NOTE    S:  Pt states still with significant pain. Describes it as a 7/10. Using po pain meds. No vomiting and eating well.  Voiding well.    O:  Patient Vitals for the past 4 hrs:   Temp Pulse Resp BP SpO2   10/08/11 0657 97.8 ??F (36.6 ??C) 90  18  154/110 mmHg 97 %   10/08/11 0526 - - - - 95 %   10/08/11 0444 98.9 ??F (37.2 ??C) 102  20  157/110 mmHg 90 %             Intake/Output Summary (Last 24 hours) at 10/08/11 0826  Last data filed at 10/08/11 0658   Gross per 24 hour   Intake 1750.84 ml   Output   1100 ml   Net 650.84 ml      Recent Results (from the past 12 hour(s))   CBC WITH AUTOMATED DIFF    Collection Time    10/08/11  5:30 AM       Component Value Range    WBC 15.0 (*) 4.6 - 13.2 K/uL    RBC 4.00 (*) 4.20 - 5.30 M/uL    HGB 10.6 (*) 12.0 - 16.0 g/dL    HCT 16.1 (*) 09.6 - 45.0 %    MCV 78.8  74.0 - 97.0 FL    MCH 26.5  24.0 - 34.0 PG    MCHC 33.7  31.0 - 37.0 g/dL    RDW 04.5  40.9 - 81.1 %    PLATELET 281  135 - 420 K/uL    MPV 10.4  9.2 - 11.8 FL    NEUTROPHILS 77 (*) 40 - 73 %    LYMPHOCYTES 16 (*) 21 - 52 %    MONOCYTES 6  3 - 10 %    EOSINOPHILS 1  0 - 5 %    BASOPHILS 0  0 - 2 %    ABS. NEUTROPHILS 11.6 (*) 1.8 - 8.0 K/UL    ABS. LYMPHOCYTES 2.4  0.9 - 3.6 K/UL    ABS. MONOCYTES 0.9  0.05 - 1.2 K/UL    ABS. EOSINOPHILS 0.1  0.0 - 0.4 K/UL    ABS. BASOPHILS 0.0  0.0 - 0.06 K/UL    DF AUTOMATED           A: PID s/p one day of IV antibiotics.  BP elevated with h/o elevated bp.        P: WBC decreased.  Would continue on IV antibiotics as still with significant pain.  Consulted Medicine for BP control.  Blood cultures negative and hopefully will go home in AM on po antibiotics.       Kyung Rudd, MD  Oct 08, 2011

## 2011-10-08 NOTE — Progress Notes (Signed)
Chaplain completed the initial Spiritual Assessment of the patient, and offered Pastoral Care, see flow sheets for interventions. Patient does not have any religious/cultural needs that will affect patient???s preferences in health care. Chaplains will continue to follow and will provide pastoral care on an as needed/requested basis.   Patient unable to communicate at this time .  Dewaine Oats   Spiritual Care   [phone]

## 2011-10-08 NOTE — Other (Signed)
Bedside and Verbal shift change report given to Kristina Goodwyn, RN (oncoming nurse) by Kemisola C Adebambo, RN   (offgoing nurse).  Report given with SBAR, Kardex, Intake/Output, MAR and Recent Results.

## 2011-10-09 DIAGNOSIS — N730 Acute parametritis and pelvic cellulitis: Principal | ICD-10-CM

## 2011-10-09 LAB — CBC WITH AUTOMATED DIFF
ABS. BASOPHILS: 0 10*3/uL (ref 0.0–0.06)
ABS. EOSINOPHILS: 0.2 10*3/uL (ref 0.0–0.4)
ABS. LYMPHOCYTES: 2 10*3/uL (ref 0.9–3.6)
ABS. MONOCYTES: 1 10*3/uL (ref 0.05–1.2)
ABS. NEUTROPHILS: 10.5 10*3/uL — ABNORMAL HIGH (ref 1.8–8.0)
BASOPHILS: 0 % (ref 0–2)
EOSINOPHILS: 2 % (ref 0–5)
HCT: 32.3 % — ABNORMAL LOW (ref 35.0–45.0)
HGB: 10.9 g/dL — ABNORMAL LOW (ref 12.0–16.0)
LYMPHOCYTES: 15 % — ABNORMAL LOW (ref 21–52)
MCH: 26.5 PG (ref 24.0–34.0)
MCHC: 33.7 g/dL (ref 31.0–37.0)
MCV: 78.6 FL (ref 74.0–97.0)
MONOCYTES: 7 % (ref 3–10)
MPV: 10 FL (ref 9.2–11.8)
NEUTROPHILS: 76 % — ABNORMAL HIGH (ref 40–73)
PLATELET: 292 10*3/uL (ref 135–420)
RBC: 4.11 M/uL — ABNORMAL LOW (ref 4.20–5.30)
RDW: 14.3 % (ref 11.6–14.5)
WBC: 13.7 10*3/uL — ABNORMAL HIGH (ref 4.6–13.2)

## 2011-10-09 MED ORDER — CLONIDINE 0.1 MG TAB
0.1 mg | ORAL_TABLET | Freq: Three times a day (TID) | ORAL | Status: DC
Start: 2011-10-09 — End: 2012-08-05

## 2011-10-09 MED ORDER — OXYCODONE-ACETAMINOPHEN 5 MG-325 MG TAB
5-325 mg | ORAL_TABLET | ORAL | Status: DC | PRN
Start: 2011-10-09 — End: 2012-08-05

## 2011-10-09 MED ORDER — LEVOFLOXACIN 500 MG TAB
500 mg | ORAL_TABLET | Freq: Every day | ORAL | Status: DC
Start: 2011-10-09 — End: 2012-08-05

## 2011-10-09 MED ORDER — CLONIDINE 0.1 MG TAB
0.1 mg | Freq: Four times a day (QID) | ORAL | Status: DC | PRN
Start: 2011-10-09 — End: 2011-10-09
  Administered 2011-10-09: 10:00:00 via ORAL

## 2011-10-09 MED ORDER — METRONIDAZOLE 500 MG TAB
500 mg | ORAL_TABLET | Freq: Two times a day (BID) | ORAL | Status: DC
Start: 2011-10-09 — End: 2012-08-05

## 2011-10-09 MED ORDER — CLONIDINE 0.1 MG TAB
0.1 mg | ORAL | Status: AC
Start: 2011-10-09 — End: 2011-10-09
  Administered 2011-10-09: 14:00:00 via ORAL

## 2011-10-09 MED FILL — CLONIDINE 0.1 MG TAB: 0.1 mg | ORAL | Qty: 1

## 2011-10-09 MED FILL — DOXY-100 100 MG INTRAVENOUS SOLUTION: 100 mg | INTRAVENOUS | Qty: 100

## 2011-10-09 MED FILL — SODIUM CHLORIDE 0.9 % IV: INTRAVENOUS | Qty: 100

## 2011-10-09 MED FILL — METOPROLOL TARTRATE 5 MG/5 ML IV SOLN: 5 mg/ mL | INTRAVENOUS | Qty: 5

## 2011-10-09 MED FILL — METOPROLOL TARTRATE 25 MG TAB: 25 mg | ORAL | Qty: 1

## 2011-10-09 MED FILL — OXYCODONE-ACETAMINOPHEN 5 MG-325 MG TAB: 5-325 mg | ORAL | Qty: 1

## 2011-10-09 NOTE — Other (Signed)
Bedside and Verbal shift change report given to Rica Mast (oncoming nurse) by Percell Miller (offgoing nurse) with York Pellant

## 2011-10-09 NOTE — Progress Notes (Addendum)
Pt BP  is 169/118. Dr Alma Downs is notified and she states she will order something for her.will continue monitoring. Pt is sleeping at this time and asymptomatic.

## 2011-10-09 NOTE — Progress Notes (Signed)
Medicine Progress Note    Patient: Bethany Keller   Age:  31 y.o.  DOA: 10/07/2011   Admit Dx / CC: Hydrosalpinx  Hydrosalpinx  LOS:  LOS: 2 days     Subjective:   Patient seen and examined. 10/09/11  Patient discharged by GYN, she insisting in going home  BP still poorly controlled, she stays it is because she is very uncomfortable in the hospital    Objective:     Visit Vitals   Item Reading   ??? BP 157/111   ??? Pulse 92   ??? Temp 98.6 ??F (37 ??C)   ??? Resp 18   ??? Ht 5\' 5"  (1.651 m)   ??? Wt 81.647 kg (180 lb)   ??? BMI 29.95 kg/m2   ??? SpO2 99%       Physical Exam:  General appearance: alert, cooperative, no distress, appears stated age  Head: Normocephalic, without obvious abnormality, atraumatic  Neck: supple, trachea midline  Lungs: clear to auscultation bilaterally  Heart: regular rate and rhythm, S1, S2 normal, no murmur, click, rub or gallop  Abdomen: soft, mild tenderness. Bowel sounds normal. No masses,  no organomegaly  Extremities: extremities normal, atraumatic, no cyanosis or edema  Skin: Skin color, texture, turgor normal. No rashes or lesions  Neurologic: Grossly normal    Intake and Output:  Current Shift:  05/30 0700 - 05/30 1859  In: 240 [P.O.:240]  Out: 750 [Urine:750]  Last three shifts:  05/28 1900 - 05/30 0659  In: 6934.2 [P.O.:2280; I.V.:4654.2]  Out: 2600 [Urine:2600]    Lab/Data Reviewed:  Recent Results (from the past 24 hour(s))   CBC WITH AUTOMATED DIFF    Collection Time    10/09/11  5:50 AM       Component Value Range    WBC 13.7 (*) 4.6 - 13.2 K/uL    RBC 4.11 (*) 4.20 - 5.30 M/uL    HGB 10.9 (*) 12.0 - 16.0 g/dL    HCT 16.1 (*) 09.6 - 45.0 %    MCV 78.6  74.0 - 97.0 FL    MCH 26.5  24.0 - 34.0 PG    MCHC 33.7  31.0 - 37.0 g/dL    RDW 04.5  40.9 - 81.1 %    PLATELET 292  135 - 420 K/uL    MPV 10.0  9.2 - 11.8 FL    NEUTROPHILS 76 (*) 40 - 73 %    LYMPHOCYTES 15 (*) 21 - 52 %    MONOCYTES 7  3 - 10 %    EOSINOPHILS 2  0 - 5 %    BASOPHILS 0  0 - 2 %    ABS. NEUTROPHILS 10.5 (*) 1.8 - 8.0 K/UL     ABS. LYMPHOCYTES 2.0  0.9 - 3.6 K/UL    ABS. MONOCYTES 1.0  0.05 - 1.2 K/UL    ABS. EOSINOPHILS 0.2  0.0 - 0.4 K/UL    ABS. BASOPHILS 0.0  0.0 - 0.06 K/UL    DF AUTOMATED         Medications Reviewed:  Current Facility-Administered Medications   Medication Dose Route Frequency   ??? cloNIDine (CATAPRES) tablet 0.1 mg  0.1 mg Oral Q6H PRN   ??? cloNIDine (CATAPRES) tablet 0.1 mg  0.1 mg Oral NOW   ??? metoprolol (LOPRESSOR) tablet 12.5 mg  12.5 mg Oral Q12H   ??? metoprolol (LOPRESSOR) injection 2.5 mg  2.5 mg IntraVENous Q4H PRN   ??? 0.9% sodium chloride infusion  125 mL/hr IntraVENous CONTINUOUS   ??? gentamicin (GARAMYCIN) 335 mg in 0.9% sodium chloride 100 mL IVPB  335 mg IntraVENous Q24H   ??? sodium chloride (NS) flush 5-10 mL  5-10 mL IntraVENous PRN   ??? doxycycline (VIBRAMYCIN) 100 mg in 0.9% sodium chloride (MBP/ADV) 100 mL IVPB  100 mg IntraVENous Q12H   ??? ibuprofen (MOTRIN) tablet 400 mg  400 mg Oral Q4H PRN   ??? oxyCODONE-acetaminophen (PERCOCET) 5-325 mg per tablet 1 Tab  1 Tab Oral Q4H PRN   ??? ondansetron (ZOFRAN) injection 4 mg  4 mg IntraVENous Q4H PRN       Assessment/Plan   Principal Problem:   *Acute parametritis and pelvic cellulitis (10/09/2011)  Active Problems:     Hypertension (10/09/2011)      Patient needs better BP control, she states that she has been discharge and wants to go home  She states that she will see her PCP tomorrow   I will give her prescriptions for Clonidine 0.1 mg every 8 hours, her BP has responded well to this medication today   She understand the importance of adequate BP control      Maple Mirza, MD  Oct 09, 2011

## 2011-10-09 NOTE — Discharge Summary (Signed)
Gynecology Progress Note          Name: Bethany Keller   Medical Record Number: [id number]   Date of Birth: 11/18/1980  Today's Date: 10/09/2011  .  Admit date: 10/07/2011    Discharge Date: 10/09/2011    Attending Physician: Reesa Chew, MD    Admission Diagnoses: Hydrosalpinx  Hydrosalpinx    Discharge Diagnoses:     Problem List as of 10/09/2011  Date Reviewed: 11-07-11      Codes Class Noted - Resolved    *Acute parametritis and pelvic cellulitis 614.3  10/09/2011 - Present        Hypertension (Chronic) 401.9  10/09/2011 - Present                Procedures: * No surgery found *  Patient doing well post-op       Pain controlled on current medication.  Voiding without difficulty. Patient bowel function is returning.     Vitals:    Patient Vitals for the past 8 hrs:   BP Temp Pulse Resp SpO2   10/09/11 0754 177/126 mmHg 99.6 ??F (37.6 ??C) 101  18  96 %   10/09/11 0500 160/98 mmHg - - - -   10/09/11 0404 169/118 mmHg 99.2 ??F (37.3 ??C) 97  18  94 %     Temp (24hrs), Avg:98.4 ??F (36.9 ??C), Min:97.3 ??F (36.3 ??C), Max:99.6 ??F (37.6 ??C)    Exam:   Per myself or nursing assessment:               Patient without distress.               Lungs clear               Abdomen soft,  nontender.                            Lower extremities are negative for swelling, cords, or tenderness.    Lab/Data Review:      WBC   Date/Time Value Range Status   10/09/2011  5:50 AM 13.7* 4.6 - 13.2 K/uL Final   Nov 07, 2011  5:30 AM 15.0* 4.6 - 13.2 K/uL Final   10/07/2011  3:30 AM 24.5* 4.6 - 13.2 K/uL Final        HGB   Date/Time Value Range Status   10/09/2011  5:50 AM 10.9* 12.0 - 16.0 Shubham Thackston/dL Final   08/18/8117  1:47 AM 10.6* 12.0 - 16.0 Devonia Farro/dL Final   01/08/5620  3:08 AM 11.5* 12.0 - 16.0 Aaryanna Hyden/dL Final        PLATELET   Date/Time Value Range Status   10/09/2011  5:50 AM 292  135 - 420 K/uL Final               Assessment and Plan:  Patient appears to be having uncomplicated post  Course   Afebrile and will DC on levaquin and flagyl with FU with [place] in 2  weeks and with  Primary tomorrow.       Patient Instructions:Continue routine post-op care. With discharge home. No orders of the defined types were placed in this encounter.        Signed By:  Newman Pies, MD     Oct 09, 2011

## 2011-10-13 LAB — CULTURE, BLOOD
Culture result:: NO GROWTH
Culture result:: NO GROWTH

## 2012-08-05 LAB — CBC WITH AUTOMATED DIFF
ABS. BASOPHILS: 0 10*3/uL (ref 0.0–0.06)
ABS. EOSINOPHILS: 0.1 10*3/uL (ref 0.0–0.4)
ABS. LYMPHOCYTES: 2.8 10*3/uL (ref 0.9–3.6)
ABS. MONOCYTES: 0.4 10*3/uL (ref 0.05–1.2)
ABS. NEUTROPHILS: 4.9 10*3/uL (ref 1.8–8.0)
BASOPHILS: 0 % (ref 0–2)
EOSINOPHILS: 1 % (ref 0–5)
HCT: 36.3 % (ref 35.0–45.0)
HGB: 13.1 g/dL (ref 12.0–16.0)
LYMPHOCYTES: 34 % (ref 21–52)
MCH: 29.4 PG (ref 24.0–34.0)
MCHC: 36.1 g/dL (ref 31.0–37.0)
MCV: 81.4 FL (ref 74.0–97.0)
MONOCYTES: 5 % (ref 3–10)
MPV: 10.3 FL (ref 9.2–11.8)
NEUTROPHILS: 60 % (ref 40–73)
PLATELET: 304 10*3/uL (ref 135–420)
RBC: 4.46 M/uL (ref 4.20–5.30)
RDW: 12.5 % (ref 11.6–14.5)
WBC: 8.2 10*3/uL (ref 4.6–13.2)

## 2012-08-05 LAB — METABOLIC PANEL, COMPREHENSIVE
A-G Ratio: 1.4 (ref 0.8–1.7)
ALT (SGPT): 27 U/L (ref 12.0–78.0)
AST (SGOT): 17 U/L (ref 15–37)
Albumin: 4.3 g/dL (ref 3.4–5.0)
Alk. phosphatase: 81 U/L (ref 50–136)
Anion gap: 6 mmol/L (ref 3.0–18)
BUN/Creatinine ratio: 9 — ABNORMAL LOW (ref 12–20)
BUN: 10 MG/DL (ref 7.0–18)
Bilirubin, total: 0.5 MG/DL (ref 0.2–1.0)
CO2: 32 mmol/L (ref 21–32)
Calcium: 9.4 MG/DL (ref 8.5–10.1)
Chloride: 101 mmol/L (ref 100–108)
Creatinine: 1.08 MG/DL (ref 0.6–1.3)
GFR est AA: >60 mL/min/{1.73_m2} (ref >60)
GFR est non-AA: >60 mL/min/{1.73_m2} (ref >60)
Globulin: 3.1 g/dL (ref 2.0–4.0)
Glucose: 96 mg/dL (ref 74–99)
Potassium: 3.1 mmol/L — ABNORMAL LOW (ref 3.5–5.5)
Protein, total: 7.4 g/dL (ref 6.4–8.2)
Sodium: 139 mmol/L (ref 136–145)

## 2012-08-05 LAB — URINALYSIS W/ RFLX MICROSCOPIC
Bilirubin: NEGATIVE
Glucose: NEGATIVE mg/dL
Ketone: NEGATIVE mg/dL
Leukocyte Esterase: NEGATIVE
Nitrites: NEGATIVE
Protein: 30 mg/dL — AB
Specific gravity: 1.015 (ref 1.003–1.030)
Urobilinogen: 0.2 EU/dL (ref 0.2–1.0)
pH (UA): 7 (ref 5.0–8.0)

## 2012-08-05 LAB — CARDIAC PANEL,(CK, CKMB & TROPONIN)
CK - MB: 1.1 ng/ml (ref 0.5–3.6)
CK-MB Index: 0.6 % (ref 0.0–4.0)
CK: 192 U/L (ref 26–192)
Troponin-I, QT: <0.02 NG/ML (ref 0.0–0.045)

## 2012-08-05 LAB — WET PREP: Wet prep: NONE SEEN

## 2012-08-05 LAB — URINE MICROSCOPIC ONLY
Bacteria: NEGATIVE /hpf
RBC: 3 /hpf (ref 0–5)
WBC: 0 /hpf (ref 0–4)

## 2012-08-05 LAB — HCG URINE, QL: HCG urine, QL: NEGATIVE

## 2012-08-05 MED ORDER — SODIUM CHLORIDE 0.9 % IV
Freq: Once | INTRAVENOUS | Status: AC
Start: 2012-08-05 — End: 2012-08-05
  Administered 2012-08-05: 13:00:00 via INTRAVENOUS

## 2012-08-05 MED ORDER — HYDRALAZINE 20 MG/ML IJ SOLN
20 mg/mL | Freq: Once | INTRAMUSCULAR | Status: AC
Start: 2012-08-05 — End: 2012-08-05
  Administered 2012-08-05: 13:00:00 via INTRAVENOUS

## 2012-08-05 MED ORDER — CLONIDINE 0.1 MG TAB
0.1 mg | ORAL_TABLET | Freq: Two times a day (BID) | ORAL | Status: AC
Start: 2012-08-05 — End: 2012-09-04

## 2012-08-05 NOTE — ED Notes (Signed)
Patient's blood pressure came down 10% and is acceptable for today for provider PA Galyo--patient given Rx for HTN med, encouraged to follow-up with a primary doctor to achieve control of BP.  Life Coach Misty Stanley arrived to talk with patient about assistance with setting up follow-up.  Patient in no distress, understands to F/U with Dr Kizzie Bane' office for GYN concerns and a heavy emphasis put on f/u for BP control.  Patient verbalized understanding, left with family member and was in no distress or pain at time of discharge.

## 2012-08-05 NOTE — ED Provider Notes (Signed)
I was personally available for consultation in the emergency department.  I have not seen the patient but have reviewed the chart prior to discharge and agree with the documentation recorded by the MLP, including the assessment, treatment plan, and disposition.  Latria Mccarron, DO

## 2012-08-05 NOTE — Discharge Instructions (Signed)
Will call patient with appointment date and time. [facility] scheduling system is down.

## 2012-08-05 NOTE — Discharge Instructions (Signed)
Scheduled follow up appointment with Dr. Kathline Magic on 08/10/12 @ 215pm. Called patient and left message.

## 2012-08-05 NOTE — ED Provider Notes (Signed)
Patient is a 32 y.o. female presenting with vaginal bleeding.   Vaginal Bleeding  Pertinent negatives include no chest pain, no headaches and no shortness of breath.    pt is c/o vaginal bleeding since yesterday; used 4 pads in the past 24hrs.  +pelvic pain.  Denies bleeding elsewhere, lightheadedness, dizziness, h/o irregular or heavy menses.  Had a menses last week.  Denies h/o coagulopathy.  Not on anti-coagulants.  Denies dysuria, fever.  Doesn't have a PCP or GYN MD.    Denies tobacco; infrequent ETOH; denies illicits.    Past Medical History   Diagnosis Date   ??? Acute parametritis and pelvic cellulitis 10/09/2011   ??? Hypertension 10/09/2011        History reviewed. No pertinent past surgical history.      History reviewed. No pertinent family history.     History     Social History   ??? Marital Status: SINGLE     Spouse Name: N/A     Number of Children: N/A   ??? Years of Education: N/A     Occupational History   ??? Not on file.     Social History Main Topics   ??? Smoking status: Never Smoker    ??? Smokeless tobacco: Not on file   ??? Alcohol Use: Yes      Comment: socially   ??? Drug Use:    ??? Sexually Active: Yes -- Female partner(s)     Birth Control/ Protection: Condom     Other Topics Concern   ??? Not on file     Social History Narrative   ??? No narrative on file                  ALLERGIES: Review of patient's allergies indicates no known allergies.      Review of Systems   Respiratory: Negative for shortness of breath.    Cardiovascular: Negative for chest pain.   Genitourinary: Positive for vaginal bleeding and pelvic pain.   Neurological: Negative for headaches.       Filed Vitals:    08/05/12 0757   BP: 233/165   Pulse: 81   Temp: 99 ??F (37.2 ??C)   Resp: 16   Height: 5\' 5"  (1.651 m)   Weight: 81.647 kg (180 lb)   SpO2: 100%            Physical Exam   Nursing note and vitals reviewed.  Constitutional: She is oriented to person, place, and time. She appears well-developed and well-nourished. No distress.   overweight    HENT:   Head: Normocephalic and atraumatic.   Eyes: Pupils are equal, round, and reactive to light.   Neck: No JVD present. No tracheal deviation present. No thyromegaly present.   Cardiovascular: Normal rate, regular rhythm and normal heart sounds.  Exam reveals no gallop and no friction rub.    No murmur heard.  Pulmonary/Chest: Effort normal and breath sounds normal. No stridor. No respiratory distress. She has no wheezes. She has no rales. She exhibits no tenderness.   Abdominal: Soft. She exhibits no distension and no mass. There is no tenderness. There is no rebound and no guarding.   Genitourinary: Vaginal discharge found.   External genitalia normal.  Cervix normal and os closed.  No CMT, uterine, or adnexal TTP.  Scant bloody discharge present.   Musculoskeletal: She exhibits no edema and no tenderness.   Lymphadenopathy:     She has no cervical adenopathy.   Neurological: She is  alert and oriented to person, place, and time.   Skin: Skin is warm and dry. No rash noted. No erythema. No pallor.   Psychiatric: She has a normal mood and affect. Her behavior is normal. Thought content normal.        MDM     Differential Diagnosis; Clinical Impression; Plan:     Differential: ectopic pregnancy; uterine fibroids; anemia; malignant HTN; HTN emergency    Benign pelvic exam and not c/o lightheadedness, dizziness, palpitations, or SOB.  Have improved BP.  Will have life coaches facilitate f/up for HTN.      Procedures    9:30 AM  Diagnosis:   1. Hypertension    2. Vaginal bleeding    3. Proteinuria          Disposition: home    Follow-up Information    Follow up With Details Comments Contact Info    [facility]   [address]  [place] [address]  [phone]    Jenene Slicker, MD   [address]  [facility]  [address]  [place] [address]  [phone]      Newman Pies, MD   [address]  [place]  [address]  [place] [address]  [phone]            Current  Discharge Medication List      START taking these medications    Details   !! cloNIDine (CATAPRES) 0.1 mg tablet Take 1 Tab by mouth two (2) times a day for 30 days.  Qty: 60 Tab, Refills: 0       !! - Potential duplicate medications found. Please discuss with provider.      CONTINUE these medications which have NOT CHANGED    Details   !! cloNIDine (CATAPRES) 0.2 mg tablet Take 0.2 mg by mouth two (2) times a day.       !! - Potential duplicate medications found. Please discuss with provider.

## 2012-08-05 NOTE — ED Notes (Signed)
Jeanine Luz, RN aware of patients severe high blood pressure

## 2012-08-06 LAB — CHLAMYDIA/GC PCR
Chlamydia amplified: NEGATIVE
N. gonorrhea, amplified: NEGATIVE

## 2013-11-22 MED ORDER — LABETALOL 5 MG/ML IV SOLN
5 mg/mL | INTRAVENOUS | Status: AC
Start: 2013-11-22 — End: 2013-11-22
  Administered 2013-11-23: via INTRAVENOUS

## 2013-11-22 MED ORDER — HYDROMORPHONE (PF) 1 MG/ML IJ SOLN
1 mg/mL | Freq: Once | INTRAMUSCULAR | Status: AC
Start: 2013-11-22 — End: 2013-11-22
  Administered 2013-11-22: via INTRAVENOUS

## 2013-11-22 MED ORDER — ONDANSETRON (PF) 4 MG/2 ML INJECTION
4 mg/2 mL | INTRAMUSCULAR | Status: AC
Start: 2013-11-22 — End: 2013-11-22
  Administered 2013-11-22: via INTRAVENOUS

## 2013-11-22 MED FILL — ONDANSETRON (PF) 4 MG/2 ML INJECTION: 4 mg/2 mL | INTRAMUSCULAR | Qty: 2

## 2013-11-22 MED FILL — HYDROMORPHONE (PF) 1 MG/ML IJ SOLN: 1 mg/mL | INTRAMUSCULAR | Qty: 1

## 2013-11-22 MED FILL — LABETALOL 5 MG/ML IV SOLN: 5 mg/mL | INTRAVENOUS | Qty: 20

## 2013-11-22 NOTE — ED Provider Notes (Signed)
HPI Comments: Bethany Keller is a 33 y.o. Female Hx Uncontrolled Hypertension presents to the ED C/O Elevated Blood Pressure and Headache that onset this morning. Patient notes the Headache is to her Occipital. She admits to two months medication non-compliance. Patient reported captured two blood pressure reading of "200/?" twice today. Patient denies Nausea, Vomiting, Palpitation or Chest Pain. Patient denies Extremity Weakness or Facial Droop. Patient denies or any other complaints at this time.     Patient is a 33 y.o. female presenting with hypertension and headaches.   Hypertension   Associated symptoms include headaches. Pertinent negatives include no chest pain, no shortness of breath and no vomiting.   Headache   Pertinent negatives include no fever, no shortness of breath and no vomiting.        Past Medical History   Diagnosis Date   ??? Acute parametritis and pelvic cellulitis 10/09/2011   ??? Hypertension 10/09/2011        History reviewed. No pertinent past surgical history.      History reviewed. No pertinent family history.     History     Social History   ??? Marital Status: SINGLE     Spouse Name: N/A     Number of Children: N/A   ??? Years of Education: N/A     Occupational History   ??? Not on file.     Social History Main Topics   ??? Smoking status: Never Smoker    ??? Smokeless tobacco: Not on file   ??? Alcohol Use: Yes      Comment: socially   ??? Drug Use: Not on file   ??? Sexual Activity:     Partners: Male     Birth Control/ Protection: Condom     Other Topics Concern   ??? Not on file     Social History Narrative                  ALLERGIES: Review of patient's allergies indicates no known allergies.      Review of Systems   Constitutional: Negative for fever and chills.        (+) Elevated Blood Pressure   HENT: Negative for congestion and sore throat.    Eyes: Negative for redness and visual disturbance.   Respiratory: Negative for shortness of breath.    Cardiovascular: Negative for chest pain.    Gastrointestinal: Negative for vomiting, abdominal pain and diarrhea.   Genitourinary: Negative for difficulty urinating.   Musculoskeletal: Negative for myalgias and arthralgias.   Skin: Negative for rash.   Neurological: Positive for headaches.   Psychiatric/Behavioral: Negative for dysphoric mood.       Filed Vitals:    11/22/13 1724   BP: 228/167   Pulse: 102   Temp: 99.1 ??F (37.3 ??C)   Resp: 18   SpO2: 100%            Physical Exam   Constitutional: She is oriented to person, place, and time. She appears well-developed and well-nourished. No distress.   HENT:   Head: Normocephalic and atraumatic.   Nose: Nose normal.   Colored Contacts.     Eyes: Right eye exhibits no discharge. Left eye exhibits no discharge. No scleral icterus.   Neck: Neck supple. No JVD present.   Cardiovascular: Normal rate, regular rhythm and normal heart sounds.  Exam reveals no gallop and no friction rub.    No murmur heard.  Pulmonary/Chest: Effort normal and breath sounds normal. No respiratory distress.  She has no wheezes. She has no rales.   Abdominal: Soft. She exhibits no mass. There is no tenderness. There is no rebound and no guarding.   Musculoskeletal: She exhibits no edema or tenderness.   Neurological: She is alert and oriented to person, place, and time. She exhibits normal muscle tone. Coordination normal.   Skin: Skin is warm and dry. No rash noted. She is not diaphoretic.   Psychiatric: She has a normal mood and affect.   Nursing note and vitals reviewed.       MDM    Procedures      -------------------------------------------------------------------------------------------------------------------   RADIOLOGY RESULTS:   CT HEAD WO CONT    (Results Pending)         ORDERS  Orders Placed This Encounter   ??? CT HEAD WO CONT   ??? CBC WITH AUTOMATED DIFF   ??? METABOLIC PANEL, BASIC   ??? URINALYSIS W/ RFLX MICROSCOPIC   ??? HCG URINE, QL   ??? URINE MICROSCOPIC ONLY   ??? labetalol (NORMODYNE;TRANDATE) injection 20 mg    ??? HYDROmorphone (PF) (DILAUDID) injection 1 mg   ??? ondansetron (ZOFRAN) injection 4 mg           EKG INTERPRETATIONS/LAB RESULTS:   No results found for this or any previous visit (from the past 12 hour(s)).        PROGRESS NOTES:  7:24 PM  Dr. Sherren KernsKenneth B Khyron Garno, MD reviewed plan of care with patient.    9:23 PM  Provider notes Labetalol decreased patient blood pressure by 30%. Will withhold second dosage. Awaiting CT/Head.      CONSULTATIONS:          ED DIAGNOSES & DISPOSITIONS:   Diagnosis: No diagnosis found.      Disposition:     Follow-up Information    None          Patient's Medications   Start Taking    No medications on file   Continue Taking    CLONIDINE (CATAPRES) 0.2 MG TABLET    Take 0.2 mg by mouth two (2) times a day.   These Medications have changed    No medications on file   Stop Taking    No medications on file           SCRIBE ATTESTATION STATEMENT  Documented by: Elvis Coilobert Creekmore-Phillips, (7:23 PM), scribing for and in the presence of Sherren KernsKenneth B Loralye Loberg, MD. (7:23 PM)          PROVIDER ATTESTATION STATEMENT  I personally performed the services described in the documentation, reviewed the documentation, as recorded by the scribe in my presence, and it accurately and completely records my words and actions.  Sherren KernsKenneth B Yeng Frankie, MD.  -------------------------------------------------------------------------------------------------------------------

## 2013-11-22 NOTE — ED Notes (Signed)
Pt's bp remains elevated Dr. Manson Passey aware, no further orders rec'd.  Pt reports feeling better.

## 2013-11-22 NOTE — ED Notes (Signed)
Several days of headache. BP high. Tingling in left arm .

## 2013-11-22 NOTE — ED Notes (Signed)
Pt reports headache and sharp pain at the back of neck started at 12 noon today. Checked BP 210/108 at work. BP 228/167. Denies any vision changes. C/o lightheadedness/dizziness. Speech clear. Tongue at midline. No facial droop noted. Bilateral grips strong and equal. Sensations intact. Pt also c/o tingling in LUE and LLE. Denies any chest pain/SOB/NVD/fever at this time.

## 2013-11-22 NOTE — ED Notes (Signed)
Bedside and Verbal shift change report given to Vira Agar, Charity fundraiser (Cabin crew) by Alona Bene, RN (offgoing nurse). Report included the following information SBAR and ED Summary.

## 2013-11-23 LAB — URINALYSIS W/ RFLX MICROSCOPIC
Bilirubin: NEGATIVE
Glucose: NEGATIVE mg/dL
Ketone: NEGATIVE mg/dL
Leukocyte Esterase: NEGATIVE
Nitrites: NEGATIVE
Protein: 30 mg/dL — AB
Specific gravity: 1.015 (ref 1.003–1.030)
Urobilinogen: 0.2 EU/dL (ref 0.2–1.0)
pH (UA): 7 (ref 5.0–8.0)

## 2013-11-23 LAB — CBC WITH AUTOMATED DIFF
ABS. BASOPHILS: 0 10*3/uL (ref 0.0–0.06)
ABS. EOSINOPHILS: 0.1 10*3/uL (ref 0.0–0.4)
ABS. LYMPHOCYTES: 1.5 10*3/uL (ref 0.9–3.6)
ABS. MONOCYTES: 0.2 10*3/uL (ref 0.05–1.2)
ABS. NEUTROPHILS: 4.7 10*3/uL (ref 1.8–8.0)
BASOPHILS: 0 % (ref 0–2)
EOSINOPHILS: 1 % (ref 0–5)
HCT: 26.9 % — ABNORMAL LOW (ref 35.0–45.0)
HGB: 9.5 g/dL — ABNORMAL LOW (ref 12.0–16.0)
LYMPHOCYTES: 22 % (ref 21–52)
MCH: 28.6 PG (ref 24.0–34.0)
MCHC: 35.3 g/dL (ref 31.0–37.0)
MCV: 81 FL (ref 74.0–97.0)
MONOCYTES: 4 % (ref 3–10)
MPV: 10.7 FL (ref 9.2–11.8)
NEUTROPHILS: 73 % (ref 40–73)
PLATELET: 79 10*3/uL — ABNORMAL LOW (ref 135–420)
RBC: 3.32 M/uL — ABNORMAL LOW (ref 4.20–5.30)
RDW: 13 % (ref 11.6–14.5)
WBC: 6.5 10*3/uL (ref 4.6–13.2)

## 2013-11-23 LAB — METABOLIC PANEL, BASIC
Anion gap: 10 mmol/L (ref 3.0–18)
BUN/Creatinine ratio: 11 — ABNORMAL LOW (ref 12–20)
BUN: 14 MG/DL (ref 7.0–18)
CO2: 28 mmol/L (ref 21–32)
Calcium: 8.8 MG/DL (ref 8.5–10.1)
Chloride: 101 mmol/L (ref 100–108)
Creatinine: 1.23 MG/DL (ref 0.6–1.3)
GFR est AA: >60 mL/min/{1.73_m2} (ref >60)
GFR est non-AA: 54 mL/min/{1.73_m2} — ABNORMAL LOW (ref >60)
Glucose: 88 mg/dL (ref 74–99)
Potassium: 3 mmol/L — ABNORMAL LOW (ref 3.5–5.5)
Sodium: 139 mmol/L (ref 136–145)

## 2013-11-23 LAB — LIPID PANEL
CHOL/HDL Ratio: 2.7 (ref 0–5.0)
Cholesterol, total: 158 MG/DL (ref <200)
HDL Cholesterol: 58 MG/DL (ref 40–60)
LDL, calculated: 86.8 MG/DL (ref 0–100)
Triglyceride: 66 MG/DL (ref <150)
VLDL, calculated: 13.2 MG/DL

## 2013-11-23 LAB — HCG URINE, QL: HCG urine, QL: NEGATIVE

## 2013-11-23 LAB — URINE MICROSCOPIC ONLY
Bacteria: NEGATIVE /hpf
RBC: 0 /hpf (ref 0–5)
WBC: 0 /hpf (ref 0–4)

## 2013-11-23 LAB — T4, FREE: T4, Free: 1.3 NG/DL (ref 0.7–1.5)

## 2013-11-23 LAB — HEMOGLOBIN A1C WITH EAG: Hemoglobin A1c: 5 % (ref 4.2–6.3)

## 2013-11-23 MED ORDER — AMLODIPINE 5 MG TAB
5 mg | Freq: Once | ORAL | Status: AC
Start: 2013-11-23 — End: 2013-11-23
  Administered 2013-11-23: 10:00:00 via ORAL

## 2013-11-23 MED ORDER — HYDROCHLOROTHIAZIDE 25 MG TAB
25 mg | Freq: Every day | ORAL | Status: DC
Start: 2013-11-23 — End: 2013-11-23
  Administered 2013-11-23: 12:00:00 via ORAL

## 2013-11-23 MED ORDER — POTASSIUM CHLORIDE 10 MEQ/100 ML IV PIGGY BACK
10 mEq/0 mL | INTRAVENOUS | Status: AC
Start: 2013-11-23 — End: 2013-11-23
  Administered 2013-11-23: 03:00:00 via INTRAVENOUS

## 2013-11-23 MED ORDER — HYDROCHLOROTHIAZIDE 25 MG TAB
25 mg | ORAL_TABLET | Freq: Every day | ORAL | Status: DC
Start: 2013-11-23 — End: 2013-11-23

## 2013-11-23 MED ORDER — CARVEDILOL 12.5 MG TAB
12.5 mg | Freq: Two times a day (BID) | ORAL | Status: DC
Start: 2013-11-23 — End: 2013-11-23
  Administered 2013-11-23: 12:00:00 via ORAL

## 2013-11-23 MED ORDER — AMLODIPINE 5 MG TAB
5 mg | ORAL | Status: AC
Start: 2013-11-23 — End: 2013-11-23
  Administered 2013-11-23: 10:00:00 via ORAL

## 2013-11-23 MED ORDER — HYDROCHLOROTHIAZIDE 25 MG TAB
25 mg | ORAL_TABLET | Freq: Every day | ORAL | Status: DC
Start: 2013-11-23 — End: 2015-07-11

## 2013-11-23 MED ORDER — CARVEDILOL 6.25 MG TAB
6.25 mg | ORAL_TABLET | Freq: Two times a day (BID) | ORAL | Status: DC
Start: 2013-11-23 — End: 2015-07-11

## 2013-11-23 MED ORDER — CARVEDILOL 6.25 MG TAB
6.25 mg | ORAL_TABLET | Freq: Two times a day (BID) | ORAL | Status: DC
Start: 2013-11-23 — End: 2013-11-23

## 2013-11-23 MED ORDER — LOSARTAN 25 MG TAB
25 mg | ORAL_TABLET | Freq: Every day | ORAL | Status: DC
Start: 2013-11-23 — End: 2013-11-23

## 2013-11-23 MED ORDER — LOSARTAN 25 MG TAB
25 mg | ORAL_TABLET | Freq: Every day | ORAL | Status: DC
Start: 2013-11-23 — End: 2015-07-07

## 2013-11-23 MED ORDER — LOSARTAN 50 MG TAB
50 mg | Freq: Every day | ORAL | Status: DC
Start: 2013-11-23 — End: 2013-11-23
  Administered 2013-11-23: 12:00:00 via ORAL

## 2013-11-23 MED FILL — LOSARTAN 50 MG TAB: 50 mg | ORAL | Qty: 1

## 2013-11-23 MED FILL — AMLODIPINE 5 MG TAB: 5 mg | ORAL | Qty: 2

## 2013-11-23 MED FILL — POTASSIUM CHLORIDE 10 MEQ/100 ML IV PIGGY BACK: 10 mEq/0 mL | INTRAVENOUS | Qty: 100

## 2013-11-23 MED FILL — AMLODIPINE 5 MG TAB: 5 mg | ORAL | Qty: 1

## 2013-11-23 MED FILL — CARVEDILOL 12.5 MG TAB: 12.5 mg | ORAL | Qty: 1

## 2013-11-23 MED FILL — HYDROCHLOROTHIAZIDE 25 MG TAB: 25 mg | ORAL | Qty: 1

## 2013-11-23 NOTE — Discharge Instructions (Signed)
I confirmed with patient that her PCP is Dr. Roxan HockeyFee and she will follow up with him.

## 2013-11-23 NOTE — H&P (Signed)
[id number]

## 2013-11-23 NOTE — ED Notes (Signed)
Patient was given breakfast tray.

## 2013-11-23 NOTE — ED Notes (Signed)
Pulse ox had to be moved to forehead as pt has dark red finger nail polish and pulse ox not picking up. Pt resting in position of comfort.  BP remains elevated, pt denies any pain at this time.  Snoring present but breathing is even and unlabored and pt awakens easily to verbal stimuli.

## 2013-11-23 NOTE — Progress Notes (Signed)
Chaplain conducted an initial consultation and Spiritual Assessment for Bethany Keller, who is a 33 y.o.,female. Patient???s Primary Language is: [place].   According to the patient???s EMR Religious Affiliation is: No religion.     The reason the Patient came to the hospital is:   Patient Active Problem List    Diagnosis Date Noted   ??? Acute parametritis and pelvic cellulitis 10/09/2011   ??? Hypertension 10/09/2011        The Chaplain provided the following Interventions:  Initiated a relationship of care and support.   Explored issues of faith, belief, spirituality and religious/ritual needs while hospitalized.  Listened empathically.  Provided chaplaincy education.  Provided information about Spiritual Care Services.  Offered prayer and assurance of continued prayers on patients behalf.   Chart reviewed.    The following outcomes were achieved:  Patient shared limited information about both their medical narrative and spiritual journey/beliefs.  Patient processed feeling about current hospitalization.  Patient expressed gratitude for pastoral care visit.    Assessment:  Patient does not have any religious/cultural needs that will affect patient???s preferences in health care.  There are no further spiritual or religious issues which require Spiritual Care Services interventions at this time.       Plan:  Chaplains will continue to follow and will provide pastoral care on an as needed/requested basis    .Dewaine Oats   Spiritual Care   [phone]

## 2013-11-23 NOTE — ED Notes (Signed)
Admission Med Rec completed by [email or web address]

## 2013-11-23 NOTE — Progress Notes (Signed)
Patient is ready for discharge  BP much better controlled.  Will provide prescriptions.

## 2013-11-23 NOTE — ED Notes (Signed)
I have reviewed discharge instructions with the patient.  The patient verbalized understanding.  Patient armband removed and shredded

## 2013-11-23 NOTE — ED Notes (Signed)
Dr. Newman Pies notified of pt's blood pressure, order rec'd from norvasc 10mg  po now.  Pt medicated as noted.

## 2013-11-23 NOTE — H&P (Signed)
[facility]                  [address] [place], [place]  [address]                            HISTORY AND PHYSICAL REPORT    PATIENT:     MCKAYLAH, BETTENDORF  CSN:             [id number]    ADMITTED:  11/22/2013  MRN:             [id number]     LOCATION:  ATTENDING:   Sherren Kerns, MD  DICTATING:   Pervis Hocking. Newman Pies, MD      The patient is a 33 year old female admitted for hypertensive urgency.  According to her, she has been having episodes of poorly controlled  hypertension. This episode occurred at work. She had some lightheadedness.  She checked her blood pressure at work and it was 210/105. She was asked to  come immediately to the emergency room. She was evaluated by Dr. Manson Passey, ER  physician, was noted that the blood pressure was 176/122 and was admitted  to the inpatient medical service for blood pressure control. Note, she does  take clonidine and she says she has been taking her medication, has not  missed a dose.    PAST MEDICAL HISTORY: She has a past medical history for poorly controlled  hypertension.    SOCIAL HISTORY: The patient drinks alcohol on rare occasion. No smoking. No  illicit drugs. Single.    ALLERGIES: SHE IS ALLERGIC TO SOME MEDICATION, AN ANTIHYPERTENSIVE, SHE IS  NOT SURE OF THE NAME OF IT. IT STARTS WITH M ACCORDING TO HER.    SURGERIES: None.    FAMILY HISTORY: Significant for hypertension.    REVIEW OF SYSTEMS: A 12-point review of systems was reviewed, pertinent  positives as per HPI.    MEDICATIONS: Clonidine 0.1 p.o. daily.    PHYSICAL EXAMINATION  VITAL SIGNS: Blood pressure 176/120, pulse 81, temperature 98.5,  respiratory rate 18.  HEAD, EYES, EARS, NOSE AND THROAT: Normocephalic, atraumatic. PERRLA.  HEART: Regular rhythm. Normal S1, normal S2. No S3, no S4.  LUNGS: Clear to auscultation bilaterally. No rales, wheezes or rhonchi.  ABDOMEN: Obese, soft, nontender. Bowel sounds +4.   EXTREMITIES: Without cyanosis, clubbing, or edema.  NEUROLOGIC: Cranial nerves 2-12 are grossly intact.  SKIN: Clean, dry and intact.    LABORATORY DATA: CBC within normal limits. BMP within normal limits. UA  negative. Potassium 3.0. Sodium 139.    Head CT negative for any acute intracranial abnormality. Official report  pending.    ASSESSMENT AND PLAN: The patient is a 33 year old female admitted for  hypertensive urgency, past medical history significant for poorly  controlled hypertension.    1. Hypertension, hypertensive urgency. The patient will be placed on a  standard medications for blood pressure control to include  hydrochlorothiazide as well as Losartan and Coreg 6.25 p.o. b.i.d.   as the pulse tolerates. We will adjust the blood  pressure medications and d/c hold clonidine as it may cause  rebound hypertension. Once patient has been stabilized on these  medications, patient will be discharged home for followup with her primary  care physician. If patient still proves to have blood pressure  abnormalities, the medication to be titrated to doses that are adequate. We  will watch for any features of allergies during inpatient  admission while  these medications are adjusted. Patient may benefit from a renal  Ultrasound.    2. Prophylaxis. The patient was placed in SCDs and is on Protonix.                      Markees Carns L. Newman PiesBall, MD    CLB:wmx  D: 11/23/2013 12:38 A T: 11/23/2013 01:29 A  Job #:  [id number]  CScriptDoc #:  [id number]  cc:   Tanzania Basham L. Newman PiesBall, MD        Sherren KernsKENNETH B BROWN, MD        ERIC Roxan HockeyFEE, MD

## 2015-07-07 ENCOUNTER — Inpatient Hospital Stay
Admit: 2015-07-07 | Discharge: 2015-07-11 | Disposition: A | Discharge status: Home / Self Care | Payer: Self-pay | Attending: Family Medicine | Admitting: Family Medicine

## 2015-07-07 DIAGNOSIS — N179 Acute kidney failure, unspecified: Principal | ICD-10-CM

## 2015-07-07 LAB — CBC WITH AUTOMATED DIFF
ABS. BASOPHILS: 0 10*3/uL (ref 0.0–0.06)
ABS. EOSINOPHILS: 0 10*3/uL (ref 0.0–0.4)
ABS. LYMPHOCYTES: 1.6 10*3/uL (ref 0.9–3.6)
ABS. MONOCYTES: 0.6 10*3/uL (ref 0.05–1.2)
ABS. NEUTROPHILS: 13.7 10*3/uL — ABNORMAL HIGH (ref 1.8–8.0)
BASOPHILS: 0 % (ref 0–2)
EOSINOPHILS: 0 % (ref 0–5)
HCT: 41.7 % (ref 35.0–45.0)
HGB: 15.1 g/dL (ref 12.0–16.0)
LYMPHOCYTES: 10 % — ABNORMAL LOW (ref 21–52)
MCH: 28.5 PG (ref 24.0–34.0)
MCHC: 36.2 g/dL (ref 31.0–37.0)
MCV: 78.7 FL (ref 74.0–97.0)
MONOCYTES: 4 % (ref 3–10)
MPV: 10.1 FL (ref 9.2–11.8)
NEUTROPHILS: 86 % — ABNORMAL HIGH (ref 40–73)
PLATELET: 268 10*3/uL (ref 135–420)
RBC: 5.3 M/uL (ref 4.20–5.30)
RDW: 13.2 % (ref 11.6–14.5)
WBC: 15.9 10*3/uL — ABNORMAL HIGH (ref 4.6–13.2)

## 2015-07-07 LAB — URINALYSIS W/ RFLX MICROSCOPIC
Bilirubin: NEGATIVE
Glucose: NEGATIVE mg/dL
Ketone: NEGATIVE mg/dL
Leukocyte Esterase: NEGATIVE
Nitrites: NEGATIVE
Protein: >1000 mg/dL — AB
Specific gravity: >1.03 — ABNORMAL HIGH (ref 1.005–1.030)
Urobilinogen: 0.2 EU/dL (ref 0.2–1.0)
pH (UA): 6 (ref 5.0–8.0)

## 2015-07-07 LAB — METABOLIC PANEL, COMPREHENSIVE
A-G Ratio: 1.1 (ref 0.8–1.7)
ALT (SGPT): 22 U/L (ref 13–56)
AST (SGOT): 21 U/L (ref 15–37)
Albumin: 4.5 g/dL (ref 3.4–5.0)
Alk. phosphatase: 132 U/L — ABNORMAL HIGH (ref 45–117)
Anion gap: 10 mmol/L (ref 3.0–18)
BUN/Creatinine ratio: 7 — ABNORMAL LOW (ref 12–20)
BUN: 24 MG/DL — ABNORMAL HIGH (ref 7.0–18)
Bilirubin, total: 0.6 MG/DL (ref 0.2–1.0)
CO2: 28 mmol/L (ref 21–32)
Calcium: 9.4 MG/DL (ref 8.5–10.1)
Chloride: 98 mmol/L — ABNORMAL LOW (ref 100–108)
Creatinine: 3.38 MG/DL — ABNORMAL HIGH (ref 0.6–1.3)
GFR est AA: 19 mL/min/{1.73_m2} — ABNORMAL LOW (ref >60)
GFR est non-AA: 16 mL/min/{1.73_m2} — ABNORMAL LOW (ref >60)
Globulin: 4.2 g/dL — ABNORMAL HIGH (ref 2.0–4.0)
Glucose: 129 mg/dL — ABNORMAL HIGH (ref 74–99)
Potassium: 3.3 mmol/L — ABNORMAL LOW (ref 3.5–5.5)
Protein, total: 8.7 g/dL — ABNORMAL HIGH (ref 6.4–8.2)
Sodium: 136 mmol/L (ref 136–145)

## 2015-07-07 LAB — URINE MICROSCOPIC ONLY
Granular cast: 0 /lpf
RBC: 4 /hpf (ref 0–5)
WBC: 4 /hpf (ref 0–4)

## 2015-07-07 LAB — CARDIAC PANEL,(CK, CKMB & TROPONIN)
CK - MB: 1.1 ng/ml (ref <3.6)
CK-MB Index: 0.4 % (ref 0.0–4.0)
CK: 272 U/L — ABNORMAL HIGH (ref 26–192)
Troponin-I, QT: <0.02 NG/ML (ref 0.0–0.045)

## 2015-07-07 LAB — POC LACTIC ACID: Lactic Acid (POC): 1.4 mmol/L (ref 0.4–2.0)

## 2015-07-07 LAB — LIPASE: Lipase: 301 U/L (ref 73–393)

## 2015-07-07 MED ORDER — SODIUM CHLORIDE 0.9% BOLUS IV
0.9 % | Freq: Once | INTRAVENOUS | Status: AC
Start: 2015-07-07 — End: 2015-07-07
  Administered 2015-07-07: 22:00:00 via INTRAVENOUS

## 2015-07-07 MED ORDER — SODIUM CHLORIDE 0.9% BOLUS IV
0.9 % | Freq: Once | INTRAVENOUS | Status: AC
Start: 2015-07-07 — End: 2015-07-07
  Administered 2015-07-07: 23:00:00 via INTRAVENOUS

## 2015-07-07 MED ORDER — DIPHENHYDRAMINE 50 MG CAP
50 mg | ORAL | Status: AC
Start: 2015-07-07 — End: 2015-07-07
  Administered 2015-07-07: 22:00:00 via ORAL

## 2015-07-07 MED ORDER — PROCHLORPERAZINE EDISYLATE 5 MG/ML INJECTION
5 mg/mL | INTRAMUSCULAR | Status: AC
Start: 2015-07-07 — End: 2015-07-07
  Administered 2015-07-07: 22:00:00 via INTRAVENOUS

## 2015-07-07 MED ORDER — NICARDIPINE 2.5 MG/ML IV
2510 mg/10 mL | INTRAVENOUS | Status: DC
Start: 2015-07-07 — End: 2015-07-07
  Administered 2015-07-07 – 2015-07-08 (×5): via INTRAVENOUS

## 2015-07-07 MED ORDER — ACETAMINOPHEN 500 MG TAB
500 mg | ORAL | Status: AC
Start: 2015-07-07 — End: 2015-07-07
  Administered 2015-07-07: 22:00:00 via ORAL

## 2015-07-07 MED FILL — SODIUM CHLORIDE 0.9 % IV: INTRAVENOUS | Qty: 1000

## 2015-07-07 MED FILL — DIPHENHYDRAMINE 50 MG CAP: 50 mg | ORAL | Qty: 1

## 2015-07-07 MED FILL — PROCHLORPERAZINE EDISYLATE 5 MG/ML INJECTION: 5 mg/mL | INTRAMUSCULAR | Qty: 2

## 2015-07-07 MED FILL — NICARDIPINE 2.5 MG/ML IV: 25 mg/10 mL | INTRAVENOUS | Qty: 10

## 2015-07-07 MED FILL — MAPAP EXTRA STRENGTH 500 MG TABLET: 500 mg | ORAL | Qty: 2

## 2015-07-07 NOTE — ED Provider Notes (Signed)
HPI Comments: 3:56 PM Bethany Keller is a 35 y.o. Female with a hx of HTN presenting to the ED with a migraine due to elevated blood pressure. She states that she is taking HCTZ 75m QD tablet and Coreg 6.271mBID, but ran out 2 days ago. She is also c/o vomiting, and abdominal pain, associated with her elevated BP. She states that her at home monitor registered BP of 250/110. There are no other concerns at this time.     PCP: PaDeirdre EvenerMD    The history is provided by the patient.        Past Medical History:   Diagnosis Date   ??? Acute parametritis and pelvic cellulitis 10/09/2011   ??? Hypertension 10/09/2011       History reviewed. No pertinent surgical history.      History reviewed. No pertinent family history.    Social History     Social History   ??? Marital status: SINGLE     Spouse name: N/A   ??? Number of children: N/A   ??? Years of education: N/A     Occupational History   ??? Not on file.     Social History Main Topics   ??? Smoking status: Never Smoker   ??? Smokeless tobacco: Not on file   ??? Alcohol use Yes      Comment: socially   ??? Drug use: No   ??? Sexual activity: Yes     Partners: Male     Birth control/ protection: Condom     Other Topics Concern   ??? Not on file     Social History Narrative         ALLERGIES: Review of patient's allergies indicates no known allergies.    Review of Systems   Constitutional: Negative for fever.   HENT: Negative for trouble swallowing.    Respiratory: Negative for shortness of breath.    Cardiovascular: Negative for chest pain.   Gastrointestinal: Positive for abdominal pain, nausea and vomiting. Negative for diarrhea.   Genitourinary: Negative for difficulty urinating.   Musculoskeletal: Negative for back pain and neck pain.   Skin: Negative for wound.   Neurological: Positive for headaches. Negative for syncope.   Psychiatric/Behavioral: Negative for behavioral problems.   All other systems reviewed and are negative.      Vitals:     07/07/15 1815 07/07/15 1830 07/07/15 1900 07/07/15 1915   BP: (!) 258/167 (!) 246/152 (!) 216/130 (!) 211/130   Pulse: 93 (!) 102 (!) 105 (!) 107   Resp: 20 20 22 24    Temp:       SpO2: 99% 100% 100% 100%   Weight:       Height:                Physical Exam   Constitutional: She is oriented to person, place, and time. She appears well-developed. No distress.   uncomfortable-appearing, nad   HENT:   Head: Normocephalic and atraumatic.   Eyes: EOM are normal.   Neck: Normal range of motion.   Cardiovascular: Tachycardia present.    Pulmonary/Chest: Effort normal and breath sounds normal. No respiratory distress.   Abdominal: Soft. There is tenderness.   Musculoskeletal: Normal range of motion.   Mechanically stable   Neurological: She is alert and oriented to person, place, and time.   No focal deficits noted   Skin: She is diaphoretic.   Psychiatric: Her behavior is normal.   Nursing note and vitals reviewed.  MDM  Number of Diagnoses or Management Options  AKI (acute kidney injury) ([place]):   Essential hypertension:   Non-intractable vomiting with nausea, unspecified vomiting type:   Diagnosis management comments: 35 yo AAF with PMHx HTN presents with headache and vomiting.  Pt recently ran out of home BP meds, states she gets similar HA's when BP is high.  No fevers.  Examination significant for tachycardia, diaphoresis.  Will evaluate for acute process.    7:38 PM  Cr elevated from baseline, pt seems likely dehydrated given concentrated ua.  Pt remains markedly HTN, which may also be related to Cr elevation, will start nicardipine gtt.  Discussed with Dr. Doy Mince, who agreed to evaluate pt for admission.  Updated pt on results and poc - pt has been sleeping, is feeling better.       Amount and/or Complexity of Data Reviewed  Clinical lab tests: ordered and reviewed  Decide to obtain previous medical records or to obtain history from someone other than the patient: yes   Obtain history from someone other than the patient: yes  Review and summarize past medical records: yes  Discuss the patient with other providers: yes  Independent visualization of images, tracings, or specimens: yes    Risk of Complications, Morbidity, and/or Mortality  Presenting problems: high  Diagnostic procedures: high  Management options: high    Patient Progress  Patient progress: improved    ED Course       EKG  Date/Time: 07/07/2015 5:33 PM  Performed by: Jeani Hawking  Authorized by: Jeani Hawking     ECG reviewed by ED Physician in the absence of a cardiologist: yes    Previous ECG:     Previous ECG:  Unavailable  Interpretation:     Interpretation: non-specific    Rate:     ECG rate:  110    ECG rate assessment: tachycardic    Rhythm:     Rhythm: sinus tachycardia    Ectopy:     Ectopy: none    QRS:     QRS axis:  Normal    QRS intervals:  Normal  Conduction:     Conduction: normal    ST segments:     ST segments:  Normal  T waves:     T waves: non-specific                 Vitals:  Patient Vitals for the past 12 hrs:   Temp Pulse Resp BP SpO2   07/07/15 1915 - (!) 107 24 (!) 211/130 100 %   07/07/15 1900 - (!) 105 22 (!) 216/130 100 %   07/07/15 1830 - (!) 102 20 (!) 246/152 100 %   07/07/15 1815 - 93 20 (!) 258/167 99 %   07/07/15 1800 - 92 19 (!) 268/175 99 %   07/07/15 1750 - 99 23 (!) 266/165 100 %   07/07/15 1715 - (!) 134 23 (!) 269/199 98 %   07/07/15 1711 - (!) 113 21 (!) 259/191 99 %   07/07/15 1649 - - - (!) 254/195 -   07/07/15 1648 - - - (!) 280/190 -   07/07/15 1636 - - - (!) 280/130 -   07/07/15 1557 99.3 ??F (37.4 ??C) (!) 114 16 100/77 98 %        Medications ordered:   Medications   niCARdipine (CARDENE) 25 mg in 0.9% sodium chloride 250 mL infusion (5 mg/hr IntraVENous New Bag 07/07/15 1916)   sodium  chloride 0.9 % bolus infusion 1,000 mL (1,000 mL IntraVENous New Bag 07/07/15 1721)   prochlorperazine (COMPAZINE) injection 10 mg (10 mg IntraVENous Given 07/07/15 1721)    acetaminophen (TYLENOL) tablet 1,000 mg (1,000 mg Oral Given 07/07/15 1721)   diphenhydrAMINE (BENADRYL) capsule 50 mg (50 mg Oral Given 07/07/15 1721)   sodium chloride 0.9 % bolus infusion 1,000 mL (1,000 mL IntraVENous Continued On Admission 07/07/15 1822)         Lab findings:  Recent Results (from the past 12 hour(s))   URINALYSIS W/ RFLX MICROSCOPIC    Collection Time: 07/07/15  4:57 PM   Result Value Ref Range    Color YELLOW      Appearance CLEAR      Specific gravity >1.030 (H) 1.005 - 1.030    pH (UA) 6.0 5.0 - 8.0      Protein >1000 (A) NEG mg/dL    Glucose NEGATIVE  NEG mg/dL    Ketone NEGATIVE  NEG mg/dL    Bilirubin NEGATIVE  NEG      Blood MODERATE (A) NEG      Urobilinogen 0.2 0.2 - 1.0 EU/dL    Nitrites NEGATIVE  NEG      Leukocyte Esterase NEGATIVE  NEG     URINE MICROSCOPIC ONLY    Collection Time: 07/07/15  4:57 PM   Result Value Ref Range    WBC 4 to 10 0 - 4 /hpf    RBC 4 to 10 0 - 5 /hpf    Epithelial cells FEW 0 - 5 /lpf    Bacteria 1+ (A) NEG /hpf    Granular cast 0 to 3 NEG /lpf   EKG, 12 LEAD, INITIAL    Collection Time: 07/07/15  5:06 PM   Result Value Ref Range    Ventricular Rate 110 BPM    Atrial Rate 110 BPM    P-R Interval 148 ms    QRS Duration 88 ms    Q-T Interval 368 ms    QTC Calculation (Bezet) 498 ms    Calculated P Axis 69 degrees    Calculated R Axis -7 degrees    Calculated T Axis 122 degrees    Diagnosis       Sinus tachycardia  Biatrial enlargement  Left ventricular hypertrophy  ST & T wave abnormality, consider lateral ischemia  Abnormal ECG  No previous ECGs available     METABOLIC PANEL, COMPREHENSIVE    Collection Time: 07/07/15  5:13 PM   Result Value Ref Range    Sodium 136 136 - 145 mmol/L    Potassium 3.3 (L) 3.5 - 5.5 mmol/L    Chloride 98 (L) 100 - 108 mmol/L    CO2 28 21 - 32 mmol/L    Anion gap 10 3.0 - 18 mmol/L    Glucose 129 (H) 74 - 99 mg/dL    BUN 24 (H) 7.0 - 18 MG/DL    Creatinine 3.38 (H) 0.6 - 1.3 MG/DL    BUN/Creatinine ratio 7 (L) 12 - 20       GFR est AA 19 (L) >60 ml/min/1.11m    GFR est non-AA 16 (L) >60 ml/min/1.776m   Calcium 9.4 8.5 - 10.1 MG/DL    Bilirubin, total 0.6 0.2 - 1.0 MG/DL    ALT (SGPT) 22 13 - 56 U/L    AST (SGOT) 21 15 - 37 U/L    Alk. phosphatase 132 (H) 45 - 117 U/L    Protein, total 8.7 (H)  6.4 - 8.2 g/dL    Albumin 4.5 3.4 - 5.0 g/dL    Globulin 4.2 (H) 2.0 - 4.0 g/dL    A-G Ratio 1.1 0.8 - 1.7     CBC WITH AUTOMATED DIFF    Collection Time: 07/07/15  5:13 PM   Result Value Ref Range    WBC 15.9 (H) 4.6 - 13.2 K/uL    RBC 5.30 4.20 - 5.30 M/uL    HGB 15.1 12.0 - 16.0 g/dL    HCT 41.7 35.0 - 45.0 %    MCV 78.7 74.0 - 97.0 FL    MCH 28.5 24.0 - 34.0 PG    MCHC 36.2 31.0 - 37.0 g/dL    RDW 13.2 11.6 - 14.5 %    PLATELET 268 135 - 420 K/uL    MPV 10.1 9.2 - 11.8 FL    NEUTROPHILS 86 (H) 40 - 73 %    LYMPHOCYTES 10 (L) 21 - 52 %    MONOCYTES 4 3 - 10 %    EOSINOPHILS 0 0 - 5 %    BASOPHILS 0 0 - 2 %    ABS. NEUTROPHILS 13.7 (H) 1.8 - 8.0 K/UL    ABS. LYMPHOCYTES 1.6 0.9 - 3.6 K/UL    ABS. MONOCYTES 0.6 0.05 - 1.2 K/UL    ABS. EOSINOPHILS 0.0 0.0 - 0.4 K/UL    ABS. BASOPHILS 0.0 0.0 - 0.06 K/UL    DF AUTOMATED     CARDIAC PANEL,(CK, CKMB & TROPONIN)    Collection Time: 07/07/15  5:13 PM   Result Value Ref Range    CK 272 (H) 26 - 192 U/L    CK - MB 1.1 <3.6 ng/ml    CK-MB Index 0.4 0.0 - 4.0 %    Troponin-I, Qt. <0.02 0.0 - 0.045 NG/ML   LIPASE    Collection Time: 07/07/15  5:13 PM   Result Value Ref Range    Lipase 301 73 - 393 U/L   POC LACTIC ACID    Collection Time: 07/07/15  6:11 PM   Result Value Ref Range    Lactic Acid (POC) 1.4 0.4 - 2.0 mmol/L       EKG interpretation by ED Physician:       X-Ray, CT or other radiology findings or impressions:  No orders to display       Orders:  Orders Placed This Encounter   ??? METABOLIC PANEL, COMPREHENSIVE     Standing Status:   Standing     Number of Occurrences:   1   ??? CBC WITH AUTOMATED DIFF     Standing Status:   Standing     Number of Occurrences:   1    ??? CARDIAC PANEL,(CK, CKMB & TROPONIN)     Standing Status:   Standing     Number of Occurrences:   1   ??? URINALYSIS W/ RFLX MICROSCOPIC     Standing Status:   Standing     Number of Occurrences:   1   ??? LIPASE     Standing Status:   Standing     Number of Occurrences:   1   ??? URINE MICROSCOPIC ONLY     Standing Status:   Standing     Number of Occurrences:   1   ??? EKG NOTEWRITER(ASAP ONLY)     This order was created via procedure documentation     Standing Status:   Standing     Number of Occurrences:  1   ??? POC LACTIC ACID     Standing Status:   Standing     Number of Occurrences:   1   ??? POC LACTIC ACID     Standing Status:   Standing     Number of Occurrences:   1   ??? EKG, 12 LEAD, INITIAL     Standing Status:   Standing     Number of Occurrences:   1     Order Specific Question:   Reason for Exam:     Answer:   Pain   ??? sodium chloride 0.9 % bolus infusion 1,000 mL   ??? prochlorperazine (COMPAZINE) injection 10 mg   ??? acetaminophen (TYLENOL) tablet 1,000 mg   ??? diphenhydrAMINE (BENADRYL) capsule 50 mg   ??? sodium chloride 0.9 % bolus infusion 1,000 mL   ??? niCARdipine (CARDENE) 25 mg in 0.9% sodium chloride 250 mL infusion     Order Specific Question:   Titrate Infusion?     Answer:   Yes     Order Specific Question:   Initial Infusion Rate:     Answer:   5 mg/hr     Order Specific Question:   Titrate Every 10-15 Min in Increments of:     Answer:   2.5 mg/hr     Order Specific Question:   Goal of Therapy is:     Answer:   SBP less than 160 mmHg     Order Specific Question:   Contact Physician for     Answer:   SBP less than 90 mmHg     Order Specific Question:   Hold for     Answer:   HR less than 60 bpm   ??? IP CONSULT TO HOSPITALIST     Standing Status:   Standing     Number of Occurrences:   1     Order Specific Question:   Reason for Consult:     Answer:   HYPERTENSION     Order Specific Question:   Did you call or speak to the consulting provider?     Answer:   No     Order Specific Question:   Consult To      Answer:   hospitalist       Reevaluation, Progress notes, Consult notes, or additional Procedure notes:   6:43 PM Patient is feeling better. Still slightly tachycardic, but heart rate has improved.  Her creatinine is still elevated at 3.38 from baseline.  Made patient aware of findings and plan to admit.    Disposition:  Diagnosis:   1. AKI (acute kidney injury) ([place])    2. Essential hypertension    3. Non-intractable vomiting with nausea, unspecified vomiting type        Disposition: Admit    Follow-up Information     None           Patient's Medications   Start Taking    No medications on file   Continue Taking    CARVEDILOL (COREG) 6.25 MG TABLET    Take 1 Tab by mouth two (2) times daily (with meals).    HYDROCHLOROTHIAZIDE (HYDRODIURIL) 25 MG TABLET    Take 1 Tab by mouth daily.   These Medications have changed    No medications on file   Stop Taking    No medications on file         Scribe Attestation  Reed Breech scribing for and in the presence of Jeani Hawking,  MD (07/07/15)      Physician Attestation  I personally performed the services described in this documentation, reviewed and edited the documentation which was dictated to the scribe in my presence, and it accurately records my words and actions.    Jeani Hawking, MD (07/07/15)      Signed by: Reed Breech, Scribe, July 07, 2015 at 7:38 PM

## 2015-07-07 NOTE — ED Notes (Signed)
Charge RN notified of pts BP and need to move to main treatment area.

## 2015-07-07 NOTE — ED Notes (Signed)
Report recieved

## 2015-07-07 NOTE — ED Notes (Signed)
Shift report given to anneliese RN.

## 2015-07-07 NOTE — ED Notes (Signed)
Automatic BP cuff errored out X 3  Manual BP taken- 280/130

## 2015-07-07 NOTE — ED Triage Notes (Signed)
Patient with migraine headache and had a blood pressure at home of greater then 200

## 2015-07-08 LAB — EKG, 12 LEAD, INITIAL
Atrial Rate: 110 {beats}/min
Calculated P Axis: 69 degrees
Calculated R Axis: -7 degrees
Calculated T Axis: 122 degrees
P-R Interval: 148 ms
Q-T Interval: 368 ms
QRS Duration: 88 ms
QTC Calculation (Bezet): 498 ms
Ventricular Rate: 110 {beats}/min

## 2015-07-08 LAB — MAGNESIUM: Magnesium: 2 mg/dL (ref 1.8–2.4)

## 2015-07-08 LAB — TROPONIN I: Troponin-I, QT: 0.05 NG/ML — ABNORMAL HIGH (ref 0.0–0.045)

## 2015-07-08 LAB — HEPATIC FUNCTION PANEL
A-G Ratio: 1 (ref 0.8–1.7)
ALT (SGPT): 17 U/L (ref 13–56)
AST (SGOT): 13 U/L — ABNORMAL LOW (ref 15–37)
Albumin: 3.4 g/dL (ref 3.4–5.0)
Alk. phosphatase: 96 U/L (ref 45–117)
Bilirubin, direct: 0.1 MG/DL (ref 0.0–0.2)
Bilirubin, total: 0.4 MG/DL (ref 0.2–1.0)
Globulin: 3.3 g/dL (ref 2.0–4.0)
Protein, total: 6.7 g/dL (ref 6.4–8.2)

## 2015-07-08 LAB — METABOLIC PANEL, BASIC
Anion gap: 9 mmol/L (ref 3.0–18)
BUN/Creatinine ratio: 8 — ABNORMAL LOW (ref 12–20)
BUN: 23 MG/DL — ABNORMAL HIGH (ref 7.0–18)
CO2: 26 mmol/L (ref 21–32)
Calcium: 8.5 MG/DL (ref 8.5–10.1)
Chloride: 101 mmol/L (ref 100–108)
Creatinine: 2.95 MG/DL — ABNORMAL HIGH (ref 0.6–1.3)
GFR est AA: 22 mL/min/{1.73_m2} — ABNORMAL LOW (ref >60)
GFR est non-AA: 18 mL/min/{1.73_m2} — ABNORMAL LOW (ref >60)
Glucose: 137 mg/dL — ABNORMAL HIGH (ref 74–99)
Potassium: 3.1 mmol/L — ABNORMAL LOW (ref 3.5–5.5)
Sodium: 136 mmol/L (ref 136–145)

## 2015-07-08 LAB — CBC W/O DIFF
HCT: 37 % (ref 35.0–45.0)
HGB: 13 g/dL (ref 12.0–16.0)
MCH: 28.3 PG (ref 24.0–34.0)
MCHC: 35.1 g/dL (ref 31.0–37.0)
MCV: 80.4 FL (ref 74.0–97.0)
MPV: 9.5 FL (ref 9.2–11.8)
PLATELET: 193 10*3/uL (ref 135–420)
RBC: 4.6 M/uL (ref 4.20–5.30)
RDW: 13.5 % (ref 11.6–14.5)
WBC: 16.8 10*3/uL — ABNORMAL HIGH (ref 4.6–13.2)

## 2015-07-08 LAB — CK: CK: 193 U/L — ABNORMAL HIGH (ref 26–192)

## 2015-07-08 LAB — PHOSPHORUS: Phosphorus: 3.3 MG/DL (ref 2.5–4.9)

## 2015-07-08 LAB — BUN: BUN: 25 MG/DL — ABNORMAL HIGH (ref 7.0–18)

## 2015-07-08 MED ORDER — HYDRALAZINE 20 MG/ML IJ SOLN
20 mg/mL | INTRAMUSCULAR | Status: AC
Start: 2015-07-08 — End: 2015-07-08
  Administered 2015-07-08: 07:00:00 via INTRAVENOUS

## 2015-07-08 MED ORDER — ENOXAPARIN 30 MG/0.3 ML SUB-Q SYRINGE
30 mg/0.3 mL | SUBCUTANEOUS | Status: DC
Start: 2015-07-08 — End: 2015-07-08
  Administered 2015-07-08: 10:00:00 via SUBCUTANEOUS

## 2015-07-08 MED ORDER — ONDANSETRON (PF) 4 MG/2 ML INJECTION
4 mg/2 mL | INTRAMUSCULAR | Status: AC
Start: 2015-07-08 — End: 2015-07-08
  Administered 2015-07-08: 07:00:00 via INTRAVENOUS

## 2015-07-08 MED ORDER — SODIUM CHLORIDE 0.9 % IJ SYRG
INTRAMUSCULAR | Status: DC | PRN
Start: 2015-07-08 — End: 2015-07-11

## 2015-07-08 MED ORDER — DIPHENHYDRAMINE HCL 50 MG/ML IJ SOLN
50 mg/mL | Freq: Once | INTRAMUSCULAR | Status: AC
Start: 2015-07-08 — End: 2015-07-08
  Administered 2015-07-08: 08:00:00 via INTRAVENOUS

## 2015-07-08 MED ORDER — HYDRALAZINE 20 MG/ML IJ SOLN
20 mg/mL | Freq: Once | INTRAMUSCULAR | Status: AC
Start: 2015-07-08 — End: 2015-07-08

## 2015-07-08 MED ORDER — LABETALOL 5 MG/ML IV SOLN
5 mg/mL | INTRAVENOUS | Status: AC
Start: 2015-07-08 — End: 2015-07-08
  Administered 2015-07-08: 17:00:00

## 2015-07-08 MED ORDER — SODIUM CHLORIDE 0.9 % IJ SYRG
Freq: Three times a day (TID) | INTRAMUSCULAR | Status: DC
Start: 2015-07-08 — End: 2015-07-11
  Administered 2015-07-08 – 2015-07-10 (×4): via INTRAVENOUS

## 2015-07-08 MED ORDER — METOCLOPRAMIDE 5 MG/ML IJ SOLN
5 mg/mL | Freq: Once | INTRAMUSCULAR | Status: DC
Start: 2015-07-08 — End: 2015-07-08
  Administered 2015-07-08: 08:00:00 via INTRAVENOUS

## 2015-07-08 MED ORDER — PROMETHAZINE 25 MG/ML INJECTION
25 mg/mL | Freq: Once | INTRAMUSCULAR | Status: AC
Start: 2015-07-08 — End: 2015-07-08
  Administered 2015-07-08: 08:00:00 via INTRAVENOUS

## 2015-07-08 MED ORDER — METOPROLOL TARTRATE 5 MG/5 ML IV SOLN
5 mg/ mL | Freq: Once | INTRAVENOUS | Status: AC
Start: 2015-07-08 — End: 2015-07-08
  Administered 2015-07-08: 11:00:00 via INTRAVENOUS

## 2015-07-08 MED ORDER — LABETALOL 5 MG/ML IV SYRINGE
20 mg/4 mL (5 mg/mL) | Freq: Four times a day (QID) | INTRAVENOUS | Status: DC | PRN
Start: 2015-07-08 — End: 2015-07-08
  Administered 2015-07-08: 17:00:00 via INTRAVENOUS

## 2015-07-08 MED ORDER — NICARDIPINE IN SODIUM CHLORIDE(ISO-OSMOTIC) 20 MG/200 ML IV PIGGY BACK
INTRAVENOUS | Status: AC
Start: 2015-07-08 — End: 2015-07-09
  Administered 2015-07-08 (×2)
  Administered 2015-07-08: 03:00:00 via INTRAVENOUS

## 2015-07-08 MED ORDER — SODIUM CHLORIDE 0.9 % IV
25 mg/mL | Freq: Four times a day (QID) | INTRAVENOUS | Status: DC | PRN
Start: 2015-07-08 — End: 2015-07-09
  Administered 2015-07-08: 13:00:00 via INTRAVENOUS

## 2015-07-08 MED ORDER — NICARDIPINE 2.5 MG/ML IV
2510 mg/10 mL | INTRAVENOUS | Status: DC
Start: 2015-07-08 — End: 2015-07-08

## 2015-07-08 MED ORDER — LABETALOL 5 MG/ML IV SOLN
5 mg/mL | INTRAVENOUS | Status: DC
Start: 2015-07-08 — End: 2015-07-09
  Administered 2015-07-08 – 2015-07-09 (×8): via INTRAVENOUS

## 2015-07-08 MED ORDER — SODIUM CHLORIDE 0.9 % IJ SYRG
Freq: Three times a day (TID) | INTRAMUSCULAR | Status: DC
Start: 2015-07-08 — End: 2015-07-11
  Administered 2015-07-08 – 2015-07-10 (×5): via INTRAVENOUS

## 2015-07-08 MED ORDER — DEXTROSE 5% IN NORMAL SALINE IV
INTRAVENOUS | Status: DC
Start: 2015-07-08 — End: 2015-07-09
  Administered 2015-07-08 – 2015-07-09 (×3): via INTRAVENOUS

## 2015-07-08 MED ORDER — SODIUM CHLORIDE 0.9 % IV
INTRAVENOUS | Status: DC
Start: 2015-07-08 — End: 2015-07-08
  Administered 2015-07-08: 08:00:00 via INTRAVENOUS

## 2015-07-08 MED ORDER — SODIUM CHLORIDE 0.9 % IV
2510 mg/10 mL | INTRAVENOUS | Status: DC
Start: 2015-07-08 — End: 2015-07-09
  Administered 2015-07-08 (×2): via INTRAVENOUS

## 2015-07-08 MED ORDER — ONDANSETRON (PF) 4 MG/2 ML INJECTION
4 mg/2 mL | INTRAMUSCULAR | Status: AC
Start: 2015-07-08 — End: 2015-07-08

## 2015-07-08 MED FILL — BD POSIFLUSH NORMAL SALINE 0.9 % INJECTION SYRINGE: INTRAMUSCULAR | Qty: 10

## 2015-07-08 MED FILL — SODIUM CHLORIDE 0.9 % IV: INTRAVENOUS | Qty: 1000

## 2015-07-08 MED FILL — LABETALOL 5 MG/ML IV SOLN: 5 mg/mL | INTRAVENOUS | Qty: 60

## 2015-07-08 MED FILL — METOPROLOL TARTRATE 5 MG/5 ML IV SOLN: 5 mg/ mL | INTRAVENOUS | Qty: 5

## 2015-07-08 MED FILL — METOCLOPRAMIDE 5 MG/ML IJ SOLN: 5 mg/mL | INTRAMUSCULAR | Qty: 2

## 2015-07-08 MED FILL — ONDANSETRON (PF) 4 MG/2 ML INJECTION: 4 mg/2 mL | INTRAMUSCULAR | Qty: 2

## 2015-07-08 MED FILL — HYDRALAZINE 20 MG/ML IJ SOLN: 20 mg/mL | INTRAMUSCULAR | Qty: 1

## 2015-07-08 MED FILL — PROMETHAZINE 25 MG/ML INJECTION: 25 mg/mL | INTRAMUSCULAR | Qty: 1

## 2015-07-08 MED FILL — DIPHENHYDRAMINE HCL 50 MG/ML IJ SOLN: 50 mg/mL | INTRAMUSCULAR | Qty: 1

## 2015-07-08 MED FILL — NICARDIPINE 2.5 MG/ML IV: 25 mg/10 mL | INTRAVENOUS | Qty: 10

## 2015-07-08 MED FILL — CARDENE 20 MG/200 ML(0.1 MG/ML) IN SOD CHLOR(ISO-OSM) INTRAVENOUS SOLN: 20 mg/0 mL (0.1 mg/mL) | INTRAVENOUS | Qty: 200

## 2015-07-08 MED FILL — LOVENOX 30 MG/0.3 ML SUB-Q SYRINGE: 30 mg/0.3 mL | SUBCUTANEOUS | Qty: 0.3

## 2015-07-08 MED FILL — LABETALOL 5 MG/ML IV SOLN: 5 mg/mL | INTRAVENOUS | Qty: 40

## 2015-07-08 MED FILL — DEXTROSE 5% IN NORMAL SALINE IV: INTRAVENOUS | Qty: 1000

## 2015-07-08 NOTE — ED Notes (Signed)
Educated pt about need for second IV site  Pt agreed, Heather, tech to attempt second PIV

## 2015-07-08 NOTE — ED Notes (Signed)
Paged Dr. Orlinda Blalock to obtain diet orders for pt.  Pt sts that her nausea is gone and she wants to try to eat.

## 2015-07-08 NOTE — Progress Notes (Addendum)
1930  Bedside SBAR report taken from [facility]. Patient voices no complaints at this time, female friend is in the room with her, assisting her with her supper.      2000  Patient lying in bed with IV fluids running in each arm.  Disconnected the line from her left arm  and y'd it into another that is in patients left arm so that she does not have to keep both arms straight.  Unsure what true BP is because she will not keep her arm still or she keeps her arm contracted. She seems frustrated    2030  Patient lying onto her left side resting.  Female friend has stepped out of room.     2130  Lab tech was here to draw blood, however patient refused, stating that she did not want to be stuck anymore, plus "no one has told her what any of the previous lab work or tests were".  Told her that I would try to get some answers for her, in the meantime, I asked if she would keep her BP cuff on (which she had taken off) so that I could see an accurate reading and try to keep her arm still so that she could received her medication because everytime she bends her arm, the pump stops.      2200  Patient is taking a test online on her computer.  Her right arm has the blood pressure cuff on it, and she keeps taking it off, or when she has it on she keeps her arm bent so I am not seeing an accurate reading. Also, she knows she needs to keep her left arm straight so that she can receive her medication, however she states she needs to take her test (for school)  so she has to bend her arm.  Offered to obtain labs and IV at the same time in another part of her arm so that she would not have to keep her arm still and she refuses this idea, she "just needs to finish her test".      2230    Made Dr. Thad Ranger aware that patient has been refusing lab draws, that I can't get a good reading on her blood pressure because she won't keep the BP cuff on or her arm straight.  Have been in patients room several times  to remind her to keep arm straight so that she gets her medicine. Went in patients room with Dr. Thad Ranger and she spoke with patient and we got an accurate reading, Cardene was started.      2300  Entered room to see if patients female friend was planning on staying the night in the waiting room. He stated that he was going to spend the night in the patient's room. I explained the visitation/overnight stay policy. WHen I told Bethany Keller and her friend that he could not stay in the room, Bethany Keller stated "well I am just Bethany Keller go also"    2314   Went to the nurses station to make Dr. Thad Ranger aware of this information. Dr. Thad Ranger went into the room and talked with her a little telling her why the Cardene has been restarted and how serious her condition is.   Bethany Keller stated wanted to finish her test     0010  Patient has completed her online test for school and is ready to sleep.  Dr. Thad Ranger came back into the room to speak more with patient.  It  has been agreed to draw patients Q8 hour labs at 0200 and 1000 and by 1000 a physician should be in to speak with patient and if she does not want labs drawn she needs to make the physician aware.      0400  Denies numbing, tingling, pain or nausea. Has been resting well.  Have had to turn Cardene off and adjust the dosing of labetolol due to BP's less than 160.      0700  1 of 4 potassium replacements has been hung    0730  Bedside SBAR report has been given Bethany Keller, Charity fundraiser. Introduced patient and ensured she had no questions, or numbing/tingling or pain.

## 2015-07-08 NOTE — ED Notes (Signed)
Spoke to Dr. Thad Ranger about patients blood pressure, awaiting orders

## 2015-07-08 NOTE — ED Notes (Signed)
Patient refused second IV site

## 2015-07-08 NOTE — Progress Notes (Addendum)
Pt arrived to floor via. Alert and oriented X . Denies any pain at this time.   Pt positioned for comfort & oriented to room and call bell. Pt lying supine in bed with HOB elevated approximately 30 degrees. See Admission assessment for further findings.     1405 patient started resting without distress or discomfort noted at this time.  Family at the bedside.    1600 patient requested bedpan at this time, self care provided.    1800 called dietary due to patient didn't receive dinner tray.    1920 Bedside and Verbal shift change report given to Claris Gower, Charity fundraiser (oncoming nurse) by Geradine Girt, RN (offgoing nurse).  Report given with SBAR, Kardex, Intake/Output, MAR and Recent Results.           Marland Kitchen

## 2015-07-08 NOTE — ED Notes (Signed)
Dr Thad Ranger notified about the patient vomiting, received orders for Zofran 

## 2015-07-08 NOTE — ED Notes (Signed)
Report given to Jessica B, RN.

## 2015-07-08 NOTE — ED Notes (Signed)
Dr Thad Ranger notified about the patients increasing blood pressure. Received orders.

## 2015-07-08 NOTE — ED Notes (Signed)
Spoke to Dr. Orlinda Blalock about getting a diet order for pt.  He ordered a cardiac diet.

## 2015-07-08 NOTE — H&P (Signed)
History and Physical    Patient: Bethany Keller               Sex: female          DOA: 07/07/2015       Date of Birth:  09-20-1980      Age:  35 y.o.        LOS:  LOS: 0 days        HPI:     Bethany Keller is a 35 y.o. female who presents to ED complaining of headache and very high blood pressure.  Patient has a h/o HTN however states her pressures have never been this high before, and for this long.  She recently ran out of her medication and reports that her symptoms began yesterday.  Headache a 8/10 in pain.  This drove her to the ED.  While in the ED, patient found to be in acute renal failure with blood pressure as high as 280/190.  She was placed on a Cardene drip with som relief of the pressure.  Her symptoms continue despite various medications to help.  Patient admitted for further management.    Past Medical History:   Diagnosis Date   ??? Acute parametritis and pelvic cellulitis 10/09/2011   ??? Hypertension 10/09/2011       History reviewed. No pertinent surgical history.    Social History     Social History   ??? Marital status: SINGLE     Spouse name: N/A   ??? Number of children: N/A   ??? Years of education: N/A     Occupational History   ??? Not on file.     Social History Main Topics   ??? Smoking status: Never Smoker   ??? Smokeless tobacco: Not on file   ??? Alcohol use Yes      Comment: socially   ??? Drug use: No   ??? Sexual activity: Yes     Partners: Male     Birth control/ protection: Condom     Other Topics Concern   ??? Not on file     Social History Narrative       History reviewed. No pertinent family history.    No Known Allergies    Review of Systems:  Positive in bold.  CONST: Fever, weight loss, fatigue or chills  GI: Nausea, vomiting, abdominal pain, change in bowel habits, hematochezia, melena, and GERD   INTEG: Dermatitis, abnormal moles  HEENT: Recent changes in vision, vertigo, epistaxis, dysphagia, hoarseness  CV: Chest pain, palpitations, HTN, edema and varicosities   RESP: Cough, shortness of breath, wheezing, hemoptysis, snoring.   GU: Hematuria, dysuria, frequency, urgency, retention, incontinence   MS: Weakness, joint pain and arthritis  ENDO: Diabetes, thyroid disease, polyuria, polydipsia, polyphagia, poor wound healing, heat intolerance, cold intolerance  LYMPH/HEME: Anemia, bruising and history of blood transfusions  NEURO: Dizziness, headache, fainting, seizures and stroke  PSYCH: Anxiety , depression    Physical Exam:      Visit Vitals   ??? BP (!) 187/124   ??? Pulse (!) 119   ??? Temp 99.3 ??F (37.4 ??C)   ??? Resp 21   ??? Ht 5\' 5"  (1.651 m)   ??? Wt 71.7 kg (158 lb)   ??? SpO2 98%   ??? BMI 26.29 kg/m2       Physical Exam:  Gen:  No distress, alert, awake and oriented x 4  HEENT:  Normal cephalic atraumatic, extra-occular movements are intact.  Neck:  Supple, No JVD  Lungs:  Clear bilaterally, no wheeze, no rales, normal effort  Cardiovascular:  Sinus tachycardia, normal S1 and S2, no audible murmur.   Capillary refill: < 3 seconds.  Abdomen:  Soft, non tender, non distended, normal bowel sounds, no guarding.  Extremities:  Well perfused, no cyanosis, no edema  Neurological:  Awake and alert, CN's are intact, normal strength throughout extremities  Skin:  No rashes or moles.  Turgor and color normal  Mental Status:  Normal thought process, does not appear anxious      Laboratory Studies:    All lab results for the last 24 hours reviewed.    Assessment/Plan     Active Problems:    Acute renal failure (ARF) (HCC) (07/08/2015)      Hypertensive emergency (07/08/2015)      Headache (07/08/2015)        PLAN:    CV: HTN- emergency.   Continue cardene   Admit to stepdown    Heme:  DVT prophylaxis   Bilateral lower extremity compression devices    GI: nausea and  Vomiting   Phenergan every 6 hours    Nephro: acute renal failure   Repeat chemistry in am   Consider nephrology consult if no improvement    Neuro: headache   Tylenol 650 mg every 6 hours as needed.    Misc:  FULL CODE    Physical therapy to evaluate and treat   Fall precautions   Ambulate with assistance.   Monitor intake and output   Monitor vitals as per unit   Neurovascular checks   Replace electrolytes as needed.   Follow up am labs.          Bethany Eva, MD

## 2015-07-08 NOTE — Other (Signed)
1145:  Spoke with Dr. Orlinda Blalock re: elevated blood pressure.  Orders received for PRN labetalol.  Primary RN Kenney Houseman informed.

## 2015-07-08 NOTE — Progress Notes (Signed)
Hospitalist Progress Note    Subjective:   Daily Progress Note: 07/08/2015 7:37 PM  Admitted with malignant hypertension and AKI after she presented to ER with headaches and hugh blood pressure.Did not take her bp meds for a while.She is on cardene and labetalol infusion.Also on iv fluid.Critical care involved.ECHO pending.    Current Facility-Administered Medications   Medication Dose Route Frequency   ??? promethazine (PHENERGAN) 6.25 mg in 0.9% sodium chloride 50 mL IVPB  6.25 mg IntraVENous Q6H PRN   ??? niCARdipine (CARDENE) 25 mg in 0.9% sodium chloride 250 mL infusion  0-15 mg/hr IntraVENous TITRATE   ??? labetalol (NORMODYNE;TRANDATE) 5 mg/mL injection       ??? labetalol (NORMODYNE;TRANDATE) 300 mg in 0.9% sodium chloride 150 mL (2 mg / 1 mL) infusion  0.5-2 mg/min IntraVENous TITRATE   ??? sodium chloride (NS) flush 5-10 mL  5-10 mL IntraVENous Q8H   ??? sodium chloride (NS) flush 5-10 mL  5-10 mL IntraVENous PRN   ??? dextrose 5% and 0.9% NaCl infusion  125 mL/hr IntraVENous CONTINUOUS   ??? sodium chloride (NS) flush 5-10 mL  5-10 mL IntraVENous Q8H   ??? sodium chloride (NS) flush 5-10 mL  5-10 mL IntraVENous PRN        Review of Systems  On and off headaches but improved compared to previous.    Objective:     Visit Vitals   ??? BP (!) 163/109   ??? Pulse 90   ??? Temp 99.4 ??F (37.4 ??C)   ??? Resp 21   ??? Ht '5\' 5"'$  (1.651 m)   ??? Wt 71.7 kg (158 lb)   ??? LMP 06/22/2015   ??? SpO2 99%   ??? Breastfeeding No   ??? BMI 26.29 kg/m2      O2 Device: (P) Room air    Temp (24hrs), Avg:99.1 ??F (37.3 ??C), Min:98.8 ??F (37.1 ??C), Max:99.4 ??F (37.4 ??C)         02/25 0701 - 02/26 1900  In: 6440 [I.V.:1315]  Out: 300 [Urine:300]  P/E  NAD,lying comfortably in bed.  Heent:perrla,at/nc,mouth moist.  Lungs ctab  Heart s1s2 nl,no m/g  Abdm:soft,not tender,bs present.  Extr:no edema,good pulses.  Neuro:aaox4,grossly intact.    Data Review    Recent Results (from the past 12 hour(s))   BUN    Collection Time: 07/08/15  2:30 PM   Result Value Ref Range     BUN 25 (H) 7.0 - 18 MG/DL   CBC W/O DIFF    Collection Time: 07/08/15  2:30 PM   Result Value Ref Range    WBC 16.8 (H) 4.6 - 13.2 K/uL    RBC 4.60 4.20 - 5.30 M/uL    HGB 13.0 12.0 - 16.0 g/dL    HCT 37.0 35.0 - 45.0 %    MCV 80.4 74.0 - 97.0 FL    MCH 28.3 24.0 - 34.0 PG    MCHC 35.1 31.0 - 37.0 g/dL    RDW 13.5 11.6 - 14.5 %    PLATELET 193 135 - 420 K/uL    MPV 9.5 9.2 - 11.8 FL   MAGNESIUM    Collection Time: 07/08/15  2:30 PM   Result Value Ref Range    Magnesium 2.0 1.8 - 2.4 mg/dL   PHOSPHORUS    Collection Time: 07/08/15  2:30 PM   Result Value Ref Range    Phosphorus 3.3 2.5 - 4.9 MG/DL   HEPATIC FUNCTION PANEL    Collection Time: 07/08/15  2:30  PM   Result Value Ref Range    Protein, total 6.7 6.4 - 8.2 g/dL    Albumin 3.4 3.4 - 5.0 g/dL    Globulin 3.3 2.0 - 4.0 g/dL    A-G Ratio 1.0 0.8 - 1.7      Bilirubin, total 0.4 0.2 - 1.0 MG/DL    Bilirubin, direct 0.1 0.0 - 0.2 MG/DL    Alk. phosphatase 96 45 - 117 U/L    AST (SGOT) 13 (L) 15 - 37 U/L    ALT (SGPT) 17 13 - 56 U/L   CK    Collection Time: 07/08/15  2:30 PM   Result Value Ref Range    CK 193 (H) 26 - 192 U/L   TROPONIN I    Collection Time: 07/08/15  2:30 PM   Result Value Ref Range    Troponin-I, Qt. 0.05 (H) 0.0 - 0.045 NG/ML         Assessment/Plan:     Active Problems:    Acute renal failure (ARF) ([facility]) (07/08/2015)      Headache (07/08/2015)      Malignant hypertension (07/08/2015)      Care Plan  1-Malignant hypertension:    -On iv cardene and labetalol.    -Gradually decrease bp.    -ECHO ordered.    -Critical care involved    -Cardiology consult  2-AKI,possibly with chronic component    Creatinine improving.Will continue to monitor,pt on iv fluid  3-Elevated troponin:likely demand ischemia due to HTN    -Monitor cardiac enzymes.ECHO ordered.Consider cardiology consult  4-Headaches    -On tylenol  DVT prophylaxis:scds  Full code  Disposition:home

## 2015-07-08 NOTE — ED Notes (Signed)
Received report from nurse, patient resting in the bed, cardiac monitor on, patient refused another line to given phenergan, will contact pharmacy to see if meds are compatible with the medication running at the present time, told patient it may be an hour or so before she has a bed upstairs, patient's husband resting in the bed next to the patient

## 2015-07-08 NOTE — Consults (Addendum)
PCCM  Consult, Initial, Brief    BP now 183/116; lying on left side; HOB 15* no distress RR ~14    Orders reviewed.   I have increased IVF and changed BP parameters and held lovenox for now. With severely elevated initial levels want to lower at highly controlled rate.       -dar   [phone]

## 2015-07-09 LAB — METABOLIC PANEL, BASIC
Anion gap: 15 mmol/L (ref 3.0–18)
BUN/Creatinine ratio: 9 — ABNORMAL LOW (ref 12–20)
BUN: 30 MG/DL — ABNORMAL HIGH (ref 7.0–18)
CO2: 22 mmol/L (ref 21–32)
Calcium: 8.3 MG/DL — ABNORMAL LOW (ref 8.5–10.1)
Chloride: 103 mmol/L (ref 100–108)
Creatinine: 3.52 MG/DL — ABNORMAL HIGH (ref 0.6–1.3)
GFR est AA: 18 mL/min/{1.73_m2} — ABNORMAL LOW (ref >60)
GFR est non-AA: 15 mL/min/{1.73_m2} — ABNORMAL LOW (ref >60)
Glucose: 111 mg/dL — ABNORMAL HIGH (ref 74–99)
Potassium: 3.2 mmol/L — ABNORMAL LOW (ref 3.5–5.5)
Sodium: 140 mmol/L (ref 136–145)

## 2015-07-09 LAB — MAGNESIUM: Magnesium: 1.9 mg/dL (ref 1.8–2.4)

## 2015-07-09 LAB — POTASSIUM: Potassium: 3.2 mmol/L — ABNORMAL LOW (ref 3.5–5.5)

## 2015-07-09 LAB — TROPONIN I: Troponin-I, QT: 0.05 NG/ML — ABNORMAL HIGH (ref 0.0–0.045)

## 2015-07-09 LAB — CALCIUM, IONIZED: Ionized Calcium: 1.12 MMOL/L (ref 1.12–1.32)

## 2015-07-09 LAB — PHOSPHORUS: Phosphorus: 3.8 MG/DL (ref 2.5–4.9)

## 2015-07-09 LAB — TSH 3RD GENERATION: TSH: 1.05 u[IU]/mL (ref 0.36–3.74)

## 2015-07-09 LAB — CK: CK: 148 U/L (ref 26–192)

## 2015-07-09 MED ORDER — POTASSIUM CHLORIDE 10 MEQ/100 ML IV PIGGY BACK
10 mEq/0 mL | INTRAVENOUS | Status: AC
Start: 2015-07-09 — End: 2015-07-09
  Administered 2015-07-09 (×4): via INTRAVENOUS

## 2015-07-09 MED ORDER — CARVEDILOL 25 MG TAB
25 mg | Freq: Two times a day (BID) | ORAL | Status: DC
Start: 2015-07-09 — End: 2015-07-11
  Administered 2015-07-09 – 2015-07-11 (×5): via ORAL

## 2015-07-09 MED ORDER — PROMETHAZINE 25 MG/ML INJECTION
25 mg/mL | Freq: Four times a day (QID) | INTRAMUSCULAR | Status: DC | PRN
Start: 2015-07-09 — End: 2015-07-11

## 2015-07-09 MED ORDER — NICARDIPINE 2.5 MG/ML IV
25 mg/10 mL | INTRAVENOUS | Status: DC
Start: 2015-07-09 — End: 2015-07-09

## 2015-07-09 MED ORDER — SODIUM CHLORIDE 0.9 % IV
5 mg/mL | INTRAVENOUS | Status: DC
Start: 2015-07-09 — End: 2015-07-09
  Administered 2015-07-09: 16:00:00 via INTRAVENOUS

## 2015-07-09 MED ORDER — CARVEDILOL 6.25 MG TAB
6.25 mg | Freq: Two times a day (BID) | ORAL | Status: DC
Start: 2015-07-09 — End: 2015-07-09
  Administered 2015-07-09: 17:00:00 via ORAL

## 2015-07-09 MED ORDER — CARVEDILOL 6.25 MG TAB
6.25 mg | Freq: Two times a day (BID) | ORAL | Status: DC
Start: 2015-07-09 — End: 2015-07-09

## 2015-07-09 MED ORDER — LABETALOL 5 MG/ML IV SYRINGE
20 mg/4 mL (5 mg/mL) | INTRAVENOUS | Status: DC | PRN
Start: 2015-07-09 — End: 2015-07-09

## 2015-07-09 MED ORDER — HYDROCHLOROTHIAZIDE 25 MG TAB
25 mg | Freq: Every day | ORAL | Status: DC
Start: 2015-07-09 — End: 2015-07-09

## 2015-07-09 MED ORDER — SODIUM CHLORIDE 0.9 % IV
INTRAVENOUS | Status: DC
Start: 2015-07-09 — End: 2015-07-09
  Administered 2015-07-09: 22:00:00 via INTRAVENOUS

## 2015-07-09 MED ORDER — ELECTROLYTE REPLACEMENT PROTOCOL
Status: DC | PRN
Start: 2015-07-09 — End: 2015-07-09

## 2015-07-09 MED ORDER — NICARDIPINE 2.5 MG/ML IV
25 mg/10 mL | INTRAVENOUS | Status: DC
Start: 2015-07-09 — End: 2015-07-09
  Administered 2015-07-09: 16:00:00 via INTRAVENOUS

## 2015-07-09 MED ORDER — SODIUM CHLORIDE 0.9 % IV
5 mg/mL | INTRAVENOUS | Status: DC
Start: 2015-07-09 — End: 2015-07-09

## 2015-07-09 MED ORDER — CLONIDINE 0.1 MG TAB
0.1 mg | Freq: Two times a day (BID) | ORAL | Status: DC
Start: 2015-07-09 — End: 2015-07-09
  Administered 2015-07-09 (×2): via ORAL

## 2015-07-09 MED ORDER — ENOXAPARIN 30 MG/0.3 ML SUB-Q SYRINGE
30 mg/0.3 mL | SUBCUTANEOUS | Status: DC
Start: 2015-07-09 — End: 2015-07-11
  Administered 2015-07-10: 19:00:00 via SUBCUTANEOUS

## 2015-07-09 MED FILL — LOVENOX 30 MG/0.3 ML SUB-Q SYRINGE: 30 mg/0.3 mL | SUBCUTANEOUS | Qty: 0.3

## 2015-07-09 MED FILL — ELECTROLYTE REPLACEMENT PROTOCOL: Qty: 1

## 2015-07-09 MED FILL — SODIUM CHLORIDE 0.9 % IV: INTRAVENOUS | Qty: 1000

## 2015-07-09 MED FILL — POTASSIUM CHLORIDE 10 MEQ/100 ML IV PIGGY BACK: 10 mEq/0 mL | INTRAVENOUS | Qty: 100

## 2015-07-09 MED FILL — LABETALOL 5 MG/ML IV SOLN: 5 mg/mL | INTRAVENOUS | Qty: 60

## 2015-07-09 MED FILL — CARVEDILOL 6.25 MG TAB: 6.25 mg | ORAL | Qty: 4

## 2015-07-09 MED FILL — NICARDIPINE 2.5 MG/ML IV: 25 mg/10 mL | INTRAVENOUS | Qty: 10

## 2015-07-09 MED FILL — CLONIDINE 0.1 MG TAB: 0.1 mg | ORAL | Qty: 2

## 2015-07-09 MED FILL — HYDROCHLOROTHIAZIDE 25 MG TAB: 25 mg | ORAL | Qty: 1

## 2015-07-09 MED FILL — LABETALOL 5 MG/ML IV SYRINGE: 20 mg/4 mL (5 mg/mL) | INTRAVENOUS | Qty: 4

## 2015-07-09 NOTE — Progress Notes (Signed)
Daily Progress Note: 07/09/2015 3:08 PM   Admit Date: 07/07/2015    Patient seen in follow up for multiple medical problems as listed below:  Patient Active Problem List   Diagnosis Code   ??? Acute parametritis and pelvic cellulitis N73.0   ??? Hypertension I10   ??? Acute renal failure (ARF) (HCC) N17.9   ??? Hypertensive emergency I16.1   ??? Headache R51   ??? Malignant hypertension I10       Assesment     Admitted with malignant hypertension and AKI after she presented to ER with headaches and hugh blood pressure.Did not take her bp meds for a while.She is on cardene and labetalol infusion.Also on iv fluid.Critical care involved. TTE preserved EF hoever LVH and grade 2 diastolic dysfunction. Creatine worsening 2/27 IVF restarted.   ??  Malignant hypertension:  -On iv cardene and labetalol-weaning  -Decrease BP 30% daily. Plan for <160 today  -TTE EF50-55% LVH, grade2dd  -will need better BP control long term. Clonidine is a poor option  -Renin level ordered for 2' HTN workup.   AKI,possibly with chronic component  Creatinine imprroved initially, then BUN/Cr up after IVF stopped. NS started  2/27  Elevated troponin:likely demand ischemia due to HTN  -resolving. Due to hypertensive demand  -TSH wnl  Headaches  -On tylenol    DVT Protocol Active: yes  Code Status:  Full Code     Disposition: Home 1-2 days    Subjective:     CC: Migraine and Hypertension    Interval History: Ha improving. BP improving. Cr worsening      Objective:     Visit Vitals   ??? BP (!) 171/103   ??? Pulse 90   ??? Temp 98.5 ??F (36.9 ??C)   ??? Resp 22   ??? Ht  (1.651 m)   ??? Wt 71.7 kg (158 lb)   ??? LMP 06/22/2015   ??? SpO2 99%   ??? Breastfeeding No   ??? BMI 26.29 kg/m2       Temp (24hrs), Avg:98.9 ??F (37.2 ??C), Min:98.4 ??F (36.9 ??C), Max:99.4 ??F (37.4 ??C)        Intake/Output Summary (Last 24 hours) at 07/09/15 1508  Last data filed at 07/09/15 0700   Gross per 24 hour   Intake          3068.83 ml   Output              800 ml   Net          2268.83 ml        Gen: AOx3, NAD  HEENT:  PERL, EOMI.   Neck: No Bruits/JVD   Lungs:   CTAB. Good respiratory effort  Heart:   RR S1 S2 without M/R/G  Abdomen: ND,NT, BSX4,   Extremities:   No LE edema. No cyanosis.  Skin:  no jaundice/lesions      Data Review:     Meds/Labs/Tests reviewed    Current Shift:     Last three shifts:  02/25 1901 - 02/27 0700  In: 3823.8 [P.O.:240; I.V.:3583.8]  Out: 800 [Urine:800]  Recent Labs      07/08/15   1430  07/07/15   1713   WBC  16.8*  15.9*   RBC  4.60  5.30   HGB  13.0  15.1   HCT  37.0  41.7   PLT  193  268   GRANS   --   86*   LYMPH   --  10*   EOS   --   0       Recent Labs      07/09/15   0215  07/08/15   1430  07/08/15   0521  07/07/15   1713   BUN  30*  25*  23*  24*   CREA  3.52*   --   2.95*  3.38*   CA  8.3*   --   8.5  9.4   ALB   --   3.4   --   4.5   K  3.2*   3.2*   --   3.1*  3.3*   NA  140   --   136  136   CL  103   --   101  98*   CO2  22   --   26  28   PHOS  3.8  3.3   --    --    GLU  111*   --   137*  129*        Lab Results   Component Value Date/Time    Glucose 111 07/09/2015 02:15 AM    Glucose 137 07/08/2015 05:21 AM    Glucose 129 07/07/2015 05:13 PM    Glucose 88 11/22/2013 07:55 PM    Glucose 96 08/05/2012 08:40 AM          Care coordination with Nursing/Consultants/staff: 5  Prior history, labs, and charting reviewed: 15    Procedures/Imaging:  2/27- -TTE EF50-55% LVH, grade2dd    Total time spent with chart review, patient examination/education, discussion with staff on case,documentation and medication management / adjustment  :  30 Minutes      Dr Malka So DO  [facility]  Hospitalist Division  Pager: [phone]

## 2015-07-09 NOTE — Other (Signed)
Please clarify if this patient is being treated/managed for:    => Hypertensive Crisis in setting of elevated BP  =>Other Explanation of clinical findings  =>Unable to Determine (no explanation of clinical findings)    The medical record reflects the following clinical findings, treatment, and risk factors:    Patient presented with migraine and has elevated blood pressures of 100/77 to 280/190. Patient on cardene and labetalol infusion.         If you DECLINE this query or would like to communicate with CDMP, please utilize the "CDMP message box" at the TOP of the Progress Note on the right.      Thank you,  Oliver Barre RN [phone]

## 2015-07-09 NOTE — Progress Notes (Addendum)
[facility]   Discharge Planning/Social Services Assessment    Reasons for Intervention: Met with patient and fiancee with patient's permission for fiancee to remain in the room. Verified face sheet information. Address and contact numbers correct. Correction in [facility] : Marry Guan (mother) [phone]. Does not live locally. Insurance: Self pay. PCP : No longer is seen at Dr. Gillian Shields office because pt went prn at work and has lost her insurance. No longer has a physician prescribing her home meds: Klonidine, Coreg. Pt in agreement to allow life-coach assist with PCP appt after discharge. She is able to pay for her meds, just needs the prescriptions written. [facility] has seen pt and given her a financial assistance application. Pt and fiancee live together. 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 PLAN : Home with fiancee who will provide transportation. Will need PCP assigned. Life-coach consult placed will need PCP appt soon after discharge. Case-management following    High Risk Criteria   Yes  No   Physician Referral   Yes  No        Date    Nursing Referral   Yes  No        Date    Patient/Family Request   Yes  No        Date       Resources:    Medicare   Yes  No   Medicaid   Yes  No   No Resources   Yes  No   Private Insurance   Yes  No   Case Manager Name/Phone Number    Other   Yes  No        (i.e. Workman's Comp)         Prior Services:    Prior Services   Yes  No   Home Health   Yes  No   [employer]   Yes  No        Number of Hours    Home Care Program   Yes  No   Case Manager    Meals on Wheels   Yes  No   Office on Aging   Yes  No   Transportation Services   Yes  No   Nursing Home   Yes  No        Nursing Home Name    [facility]   Yes  No        [place] Name    Other       Information Source:      Information obtained from   Patient   Parent    Guardian   Child   Spouse    Significant Other/Partner    Friend       EMS      Nursing Home Chart           Other:   Chart Review   Yes  No     Family/Support System:    Patient lives with   Alone     Spouse    Significant Other   Children   Caretaker    Parent   Sibling      Other       Other Support System:    Is the patient responsible for care of others   Yes  No   Information of person caring for patient on  discharge    Managers financial affairs independently   Yes  No   If no, explain:      Status Prior to Admission:    Mental Status   Awake   Alert   Oriented   Quiet/Calm  Lethargic/Sedated    Disoriented   Restless/Anxious   Combative   Personal Care   Dependent   Independent Personal Care   Requires Assistance   Meal Preparation Ability   Independent    Standby Assistance    Minimal Assistance    Moderate Assistance   Maximum Assistance      Total Assistance   Chores   Independent with [place] Resident    Requires Assistance   Bowel/Bladder   Continent   Catheter   Incontinent   Ostomy Self-Care     Urine Diversion Self-Care   Maximum Assistance      Total Assistance   Number of Persons needed for assistance    DME at home   [place], Javier Docker   [place], Straight    Commode     Bathroom/Grab [facility] Bed   Nebulizer   Oxygen            Raised Toilet Seat   Shower Chair   Side Rails for Bed    Tub Transfer Bench    Walker, Building surveyor, Standard       Other:   Vendor      Treatment Presently Receiving:    Current Treatments   Chemotherapy   Dialysis   Insulin   IVAB  IVF    O2   PCA    PT    RT    Tube Feedings    Wound Care     Psychosocial Evaluation:    Verbalized Knowledge of Disease Process   Patient  Family   Coping with Disease Process   Patient  Family   Requires Further Counseling Coping with Disease Process   Patient  Family     Identified Projected Needs:    Home Health Aid   Yes  No   Transportation   Yes  No   Education   Yes  No        Specific Education     Financial Counseling   Yes  No APA    Inability to Care for Self/Will Require 24 hour care   Yes  No   Pain Management   Yes  No   Home Infusion Therapy   Yes  No   Oxygen Therapy   Yes  No   DME   Yes  No   Long Term Care Placement   Yes  No   Rehab   Yes  No   Physical Therapy   Yes  No   Needs Anticipated At This Time   Yes  No     Intra-Hospital Referral:    [place]   Yes  No   Life Coach   Yes  No   Patient Representative   Yes  No   Staff for Teaching Needs   Yes  No   Specialty Teaching Needs     Diabetic Educator   Yes  No   Referral for Diabetic Educator Needed   Yes  No  If Yes, place order for Nutritionist or Diabetic Consult     Tentative Discharge Plan:    Home with No Services   Yes  No   Home with Home Health Follow-up   Yes  No  If Yes, specify type    [place]   Yes  No        If Yes, specify type    Meals on Wheels   Yes  No   Office of Aging   Yes  No   NHP   Yes  No   Return to the Nursing Home   Yes  No   Rehab Therapy   Yes  No   Acute Rehab   Yes  No   Subacute Rehab   Yes  No   Private Care   Yes  No   Substance Abuse Referral   Yes  No   Transportation   Yes  No   Chore Service   Yes  No   Inpatient Hospice   Yes  No   OP RT   Yes   No   OP Hemo   Yes   No   OP PT   Yes  No   Support Group   Yes  No   Reach to Recovery   Yes  No   OP Oncology Clinic   Yes  No   Clinic Appointment   Yes  No   DME   Yes  No   Comments    Name of D/C Planner or Social Worker Given to Patient or Family Lianne Cure RN   Phone Number         Extension [phone]   Date 07/09/15   Time    If you are discharged home, whom do you designate to participate in your discharge plan and receive any information needed?     Enter name of designee See ACP note        Phone # of designee         Address of designee         Updated         Patient refused to designate any           individual

## 2015-07-09 NOTE — Progress Notes (Signed)
[facility]  ICU Progress Note      Name: Bethany Keller   DOB: 01-26-1981   MRN: [id number]   Date: 07/09/2015 2:09 PM     I have reviewed the flowsheet and previous day???s notes.  Events overnight reviewed and discussed with nursing staff. Vital signs and records reviewed.    07/09/2015  Now off labetalol, on RA  Home antihypertensive regimen resumed today  Headache, n/v improved  K+ low, being replaced  ECHO with EF 50-55%, grade 2 DD, RVSP 35-40    Medication Review:  ?? Pressors -  ?? Sedation -  ?? Antibiotics -  ?? Pain -  ?? GI/ DVT - lovenox  ?? Others (other gtts)-labetalol (off)    Safety Bundles: VAP Bundle/ CAUTI/ Severe Sepsis Protocol/ Electrolyte Replacement Protocol    Vital Signs:    Visit Vitals   ??? BP (!) 171/103   ??? Pulse 90   ??? Temp 98.5 ??F (36.9 ??C)   ??? Resp 22   ??? Ht  (1.651 m)   ??? Wt 71.7 kg (158 lb)   ??? LMP 06/22/2015   ??? SpO2 99%   ??? Breastfeeding No   ??? BMI 26.29 kg/m2       O2 Device: Room air       Temp (24hrs), Avg:98.9 ??F (37.2 ??C), Min:98.4 ??F (36.9 ??C), Max:99.4 ??F (37.4 ??C)       Intake/Output:   Last shift:         Last 3 shifts: 02/25 1901 - 02/27 0700  In: 3823.8 [P.O.:240; I.V.:3583.8]  Out: 800 [Urine:800]    Intake/Output Summary (Last 24 hours) at 07/09/15 1409  Last data filed at 07/09/15 0700   Gross per 24 hour   Intake          3373.83 ml   Output              800 ml   Net          2573.83 ml           Physical Exam:    General/Neuro: Sleeping, easily arousable, NAD, oriented, moves all extremities, no FND  HEENT:  Anicteric sclerae  Resp:  Good airway entry; no rales/ wheezing/ rhonchi noted  CV:  Regular, no m  GI:  Abdomen soft, non-tender; (+) active bowel sounds  Extremities:  +2 pulses on all extremities; no edema  Skin:  Warm; no rashes/ lesions noted      DATA:     Current Facility-Administered Medications   Medication Dose Route Frequency   ??? ELECTROLYTE REPLACEMENT PROTOCOL  1 Each Other PRN    ??? ELECTROLYTE REPLACEMENT PROTOCOL  1 Each Other PRN   ??? ELECTROLYTE REPLACEMENT PROTOCOL  1 Each Other PRN   ??? ELECTROLYTE REPLACEMENT PROTOCOL  1 Each Other PRN   ??? promethazine (PHENERGAN) with saline injection 6.25 mg  6.25 mg IntraVENous Q6H PRN   ??? labetalol (NORMODYNE;TRANDATE) 300 mg in 0.9% sodium chloride 150 mL (2 mg / 1 mL) infusion  0.5-2 mg/min IntraVENous TITRATE   ??? niCARdipine (CARDENE) 25 mg in 0.9% sodium chloride 250 mL infusion  0-15 mg/hr IntraVENous TITRATE   ??? enoxaparin (LOVENOX) injection 30 mg  30 mg SubCUTAneous Q24H   ??? cloNIDine HCl (CATAPRES) tablet 0.2 mg  0.2 mg Oral BID   ??? carvedilol (COREG) tablet 25 mg  25 mg Oral BID WITH MEALS   ??? 0.9% sodium chloride infusion  150 mL/hr IntraVENous CONTINUOUS   ???  sodium chloride (NS) flush 5-10 mL  5-10 mL IntraVENous Q8H   ??? sodium chloride (NS) flush 5-10 mL  5-10 mL IntraVENous PRN   ??? sodium chloride (NS) flush 5-10 mL  5-10 mL IntraVENous Q8H   ??? sodium chloride (NS) flush 5-10 mL  5-10 mL IntraVENous PRN         Labs: Results:       Chemistry Recent Labs      07/09/15   0215  07/08/15   1430  07/08/15   0521  07/07/15   1713   GLU  111*   --   137*  129*   NA  140   --   136  136   K  3.2*   3.2*   --   3.1*  3.3*   CL  103   --   101  98*   CO2  22   --   26  28   BUN  30*  25*  23*  24*   CREA  3.52*   --   2.95*  3.38*   CA  8.3*   --   8.5  9.4   AGAP  15   --   9  10   BUCR  9*   --   8*  7*   AP   --   96   --   132*   TP   --   6.7   --   8.7*   ALB   --   3.4   --   4.5   GLOB   --   3.3   --   4.2*   AGRAT   --   1.0   --   1.1      CBC w/Diff Recent Labs      07/08/15   1430  07/07/15   1713   WBC  16.8*  15.9*   RBC  4.60  5.30   HGB  13.0  15.1   HCT  37.0  41.7   PLT  193  268   GRANS   --   86*   LYMPH   --   10*   EOS   --   0      Coagulation No results for input(s): PTP, INR, APTT in the last 72 hours.    No lab exists for component: INREXT    Liver Enzymes Recent Labs      07/08/15   1430   TP  6.7   ALB  3.4   AP  96    SGOT  13*      ABG No results found for: PH, PHI, PCO2, PCO2I, PO2, PO2I, HCO3, HCO3I, FIO2, FIO2I   Microbiology No results for input(s): CULT in the last 72 hours.       Telemetry: Sinus A-flutter Paced    A-fib Multiple PVC???s                      IMPRESSION:   ?? HTN emergency, currently off labetalol gtt  ?? Headache due to above, improved  ?? AKI on CKD  ?? N/V, improved  ?? Hypokalemia, being replaced  ?? SIRS  ?? Medical noncompliance        PLAN:   ?? Resp -  Currently stable on RA. Aspiration precautions  ?? ID - Trend WBC, fever curve  ?? CVS - Monitor HD. Labetalol gtt for SBP<170. Oral coreg and  clonidine (home meds) restarted. IVF. HTN w/up  ?? Heme/onc -   hgb and plts stable  ?? Metabolic - Replace lytes prn per protocol  ?? Renal - Renal consulted. On IVF per primary team  ?? Endocrine - Check TSH. Avoid hypoglycemia  ?? Neuro/ Pain/ Sedation -Minimize sedating medications  ?? GI - PPI, cardiac diet  ?? Prophylaxis - DVT-lovenox, GI-low risk  ?? Discussed in interdisciplinary rounds          The patient is:  acutely ill Risk of deterioration:  moderate     critically ill   high     See my orders for details    My assessment/plan was discussed with:  nursing PT/OT    respiratory therapy Dr.Rascona   family patient       Francie Massing, [place]

## 2015-07-09 NOTE — Progress Notes (Signed)
Problem: Discharge Planning  Goal: *Discharge to safe environment  Outcome: Resolved/Met Date Met:  07/09/15  PLAN : Home. Has been given financial assistance application. Pt agrees for [employer] to assist with PCP assignment

## 2015-07-09 NOTE — Other (Addendum)
Rec'd telephone report from [facility], ICU.  Report included the following information SBAR, Kardex, Intake/Output, MAR and Recent Results and POC.   1958 Pt arrived via w/c. Assessment completed. Pt denies discomfort at this time.  Pt encouraged to  call for  assistance when needed. SR up X 2 call light within reach.   2230 Order rec'd for PRN BP med and change IV fluid.  Pt refused lovenox and SCD's. Educated patient that uncontrolled htn can cause  Kidney failure, stroke, heart failure.  Pt was not receptive and stated that she could take care of her blood  pressure better at home and from there God was in control.  2330 Follow-up BP charted as well as Bilat upper and lower ext BP. Additional PRN BP ordered for SBP.180 or DBP  > 110.  No changes in assessment.  0450 20 mg labetalol IVP BP 180/120  0520 Pt refused lab draw. BP 186/115  0533 Paged Dr. Romie Jumper re: BP and refusal for labs  [phone] Per Dr. Romie Jumper, try the PRN Hydralazine then wait for AM meds. Dr. Romie Jumper informed of lab refusal.  [phone] Dr. Romie Jumper paged regarding BP and request for tylenol. Orders rec'd to transfer pt to ICU. Informed pt of  transfer.  0700 Call rec'd from [facility] ICU, confirming that pt will consent to transfer and Cardene drip. Informed Alan Ripper that  pt is aware of transfer and has not refused.  0730 Verbal bedside report given to  [facility]. Report included the following information SBAR, Kardex,  Intake/Output, MAR and Recent Results and POC.

## 2015-07-09 NOTE — Progress Notes (Signed)
Took patient up to room 3022 via wheelchair with CNA, [place].  Gathered all belongings and was assisted with carrying things by patient's female friend.  Patient voiced no complaints of pain, nausea, numbing or tingling.  RN Marcie met [place] in the room and small report was given to her.  Beth Dudding, RN had already given her full SBAR report prior to reaching the unit.

## 2015-07-09 NOTE — ACP (Advance Care Planning) (Signed)
Patient has designated her fiancee to participate in his/her discharge plan and to receive any needed information.     Name: Bill Salinas  Address: [address]  Phone number: [phone]

## 2015-07-09 NOTE — Other (Signed)
Bedside and Verbal shift change report given to Charlotte, RN (oncoming nurse) by Beth, RN (offgoing nurse). Report included the following information SBAR, Kardex, Intake/Output, MAR, Recent Results and Med Rec Status.

## 2015-07-09 NOTE — Progress Notes (Signed)
Chaplain conducted an initial consultation and Spiritual Assessment for Bethany Keller, who is a 34 y.o.,female. Patient???s Primary Language is: [place].   According to the patient???s EMR Religious Affiliation is: No religion.     The reason the Patient came to the hospital is:   Patient Active Problem List    Diagnosis Date Noted   ??? Acute renal failure (ARF) (HCC) 07/08/2015   ??? Hypertensive emergency 07/08/2015   ??? Headache 07/08/2015   ??? 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 Acute parametritis and pelvic cellulitis 10/09/2011   ??? Hypertension 10/09/2011        The Chaplain provided the following Interventions:  Initiated a relationship of care and support with patient in room 2706 this morning at 0855.  Listened as patient talked about being here and her hopes for the future.  She admitted that she was not feeling well this morning and not interested in further conversation now.  Provided information about Spiritual Care Services and left her.  Offered prayer and assurance of continued prayers on patients behalf.     The following outcomes were achieved:  Patient shared limited information about her medical narrative .  Patient processed feeling about current hospitalization.  Patient expressed gratitude for pastoral care visit.    Assessment:  Patient does not have any religious/cultural needs that will affect patient???s preferences in health care.  There are no further spiritual or religious issues which require Spiritual Care Services interventions at this time.       Plan:  Chaplains will continue to follow and will provide pastoral care on an as needed/requested basis    .Dewaine Oats   Spiritual Care   [phone]

## 2015-07-10 MED ORDER — AMLODIPINE 5 MG TAB
5 mg | Freq: Once | ORAL | Status: AC
Start: 2015-07-10 — End: 2015-07-10
  Administered 2015-07-10: 23:00:00 via ORAL

## 2015-07-10 MED ORDER — AMLODIPINE 5 MG TAB
5 mg | Freq: Every day | ORAL | Status: DC
Start: 2015-07-10 — End: 2015-07-11
  Administered 2015-07-11: 15:00:00 via ORAL

## 2015-07-10 MED ORDER — HYDRALAZINE 20 MG/ML IJ SOLN
20 mg/mL | Freq: Four times a day (QID) | INTRAMUSCULAR | Status: DC | PRN
Start: 2015-07-10 — End: 2015-07-11

## 2015-07-10 MED ORDER — DEXTROSE 5%-LACTATED RINGERS IV
INTRAVENOUS | Status: DC
Start: 2015-07-10 — End: 2015-07-10
  Administered 2015-07-10 (×2): via INTRAVENOUS

## 2015-07-10 MED ORDER — LABETALOL 5 MG/ML IV SYRINGE
20 mg/4 mL (5 mg/mL) | INTRAVENOUS | Status: DC | PRN
Start: 2015-07-10 — End: 2015-07-11
  Administered 2015-07-10: 10:00:00 via INTRAVENOUS

## 2015-07-10 MED ORDER — NICARDIPINE 2.5 MG/ML IV
2510 mg/10 mL | INTRAVENOUS | Status: DC
Start: 2015-07-10 — End: 2015-07-10
  Administered 2015-07-10: 13:00:00 via INTRAVENOUS

## 2015-07-10 MED ORDER — SODIUM CHLORIDE 0.9 % IV
25 mg/10 mL | INTRAVENOUS | Status: DC
Start: 2015-07-10 — End: 2015-07-10
  Administered 2015-07-10: 12:00:00 via INTRAVENOUS

## 2015-07-10 MED ORDER — LISINOPRIL 40 MG TAB
40 mg | Freq: Every day | ORAL | Status: DC
Start: 2015-07-10 — End: 2015-07-10
  Administered 2015-07-10: 14:00:00 via ORAL

## 2015-07-10 MED ORDER — SODIUM CHLORIDE 0.9 % IV
INTRAVENOUS | Status: DC
Start: 2015-07-10 — End: 2015-07-11
  Administered 2015-07-10 – 2015-07-11 (×3): via INTRAVENOUS

## 2015-07-10 MED ORDER — LISINOPRIL 10 MG TAB
10 mg | Freq: Once | ORAL | Status: DC
Start: 2015-07-10 — End: 2015-07-10

## 2015-07-10 MED ORDER — CHLORTHALIDONE 25 MG TAB
25 mg | Freq: Every day | ORAL | Status: DC
Start: 2015-07-10 — End: 2015-07-10
  Administered 2015-07-10: 14:00:00 via ORAL

## 2015-07-10 MED ORDER — HYDRALAZINE 20 MG/ML IJ SOLN
20 mg/mL | Freq: Four times a day (QID) | INTRAMUSCULAR | Status: DC | PRN
Start: 2015-07-10 — End: 2015-07-10
  Administered 2015-07-10 (×2): via INTRAVENOUS

## 2015-07-10 MED ORDER — CLONIDINE 0.1 MG TAB
0.1 mg | Freq: Three times a day (TID) | ORAL | Status: DC
Start: 2015-07-10 — End: 2015-07-11
  Administered 2015-07-10 – 2015-07-11 (×3): via ORAL

## 2015-07-10 MED ORDER — AMLODIPINE 5 MG TAB
5 mg | Freq: Every day | ORAL | Status: DC
Start: 2015-07-10 — End: 2015-07-10
  Administered 2015-07-10: 14:00:00 via ORAL

## 2015-07-10 MED FILL — CLONIDINE 0.1 MG TAB: 0.1 mg | ORAL | Qty: 2

## 2015-07-10 MED FILL — LABETALOL 5 MG/ML IV SYRINGE: 20 mg/4 mL (5 mg/mL) | INTRAVENOUS | Qty: 4

## 2015-07-10 MED FILL — CHLORTHALIDONE 25 MG TAB: 25 mg | ORAL | Qty: 2

## 2015-07-10 MED FILL — LISINOPRIL 40 MG TAB: 40 mg | ORAL | Qty: 1

## 2015-07-10 MED FILL — CARVEDILOL 25 MG TAB: 25 mg | ORAL | Qty: 1

## 2015-07-10 MED FILL — HYDRALAZINE 20 MG/ML IJ SOLN: 20 mg/mL | INTRAMUSCULAR | Qty: 1

## 2015-07-10 MED FILL — DEXTROSE 5%-LACTATED RINGERS IV: INTRAVENOUS | Qty: 1000

## 2015-07-10 MED FILL — SODIUM CHLORIDE 0.9 % IV: INTRAVENOUS | Qty: 1000

## 2015-07-10 MED FILL — NICARDIPINE 2.5 MG/ML IV: 25 mg/10 mL | INTRAVENOUS | Qty: 10

## 2015-07-10 MED FILL — AMLODIPINE 5 MG TAB: 5 mg | ORAL | Qty: 1

## 2015-07-10 MED FILL — LOVENOX 30 MG/0.3 ML SUB-Q SYRINGE: 30 mg/0.3 mL | SUBCUTANEOUS | Qty: 0.3

## 2015-07-10 NOTE — Procedures (Signed)
[facility]  *** FINAL REPORT ***    Name: Bethany Keller, Bethany Keller  MRN: [id number]    Inpatient  DOB: [birth year]  HIS Order #: [id number]  [facility]AKnet Visit #: [id number]  Date: 10 Jul 2015    TYPE OF TEST: Visceral Arterial Duplex    REASON FOR TEST  Malignant HTN    Aortic PSV:  91.0 cm/s  Diameter AP:     cm   TV:     cm                   Right          Left  Renal Artery:- -------------  -------------  Proximal  PSV:  82.0           92.0  Mid       PSV:  83.0           84.0  Distal    PSV:  54.0           69.0  Aortic ratio :   0.9            1.0    Medullary PSV:  23.0           14.0            EDV:   9.0            5.7            EDR:   0.4            0.4            SDR:   2.6            2.5    Cortical  PSV:            EDV:            EDR:            SDR:  Stenosis:      Normal         Normal  Kidney size:   10.4 cm        10.1 cm               x  5.3 cm      x  4.4 cm    Hilar:-        Right          Left  Acc. Time  AT:     secs           secs  Acc. Index AI:             RI: 0.61           0.59    INTERPRETATION/FINDINGS  Duplex images were obtained using 2-D gray scale, color flow, and  spectral Doppler analysis.  RENAL:  1. No significant renal artery stenosis identified, both renal  arteries visualized. Renal artery origin velocities are normal  bilaterally as well, right 87 cm/s; left 90 cm/s.  2. The right kidney measures 10.4 x 5.3 cm.  3. The left kidney measures 10.1 x 4.4 cm.  4. The renal veins are patent bilaterally.  *Technically difficult, limited examination with suboptimal imaging  due to excessive overlying bowel gas; patient was not NPO.    ADDITIONAL COMMENTS    I have personally reviewed the data relevant to the interpretation of  this  study.    TECHNOLOGIST: Geoffery Lyons. Daphine Deutscher, RVS  Signed: 07/10/2015  09:46 AM    PHYSICIAN: Lady Gary MD  Signed: 07/10/2015 04:30 PM

## 2015-07-10 NOTE — Progress Notes (Signed)
Daily Progress Note: 07/10/2015 3:08 PM   Admit Date: 07/07/2015    Patient seen in follow up for multiple medical problems as listed below:  Patient Active Problem List   Diagnosis Code   ??? Acute parametritis and pelvic cellulitis N73.0   ??? Hypertension I10   ??? Acute renal failure (ARF) (HCC) N17.9   ??? Hypertensive emergency I16.1   ??? Headache R51   ??? Malignant hypertension I10       Assesment     Admitted with malignant hypertension and AKI after she presented to ER with headaches and hugh blood pressure.Did not take her bp meds for a while.She is on cardene and labetalol infusion.Also on iv fluid.Critical care involved. TTE preserved EF hoever LVH and grade 2 diastolic dysfunction. Creatine worsening 2/27 IVF restarted. Bp has been difficult to manage. Nephrology consulted there is likely CKD present.   ??  Malignant hypertension:  -On iv cardene and labetalol-off  -Decrease BP  Plan for <160 today  -TTE EF50-55% LVH, grade2dd  -will need better BP control long term. Clonidine is a poor option  -Renin level ordered for 2' HTN workup on presentation. Possibly hyper aldosterone   AKI,possibly with chronic component  Creatinine imprroved initially, then BUN/Cr up after IVF stopped. NS started  2/27  2/28 BMP pending  Urine studies. Proteinuria eval, Renal US  Elevated troponin:likely demand ischemia due to HTN  -resolving. Due to hypertensive demand  -TSH wnl  Headaches  -On tylenol    DVT Protocol Active: yes  Code Status:  Full Code     Disposition: Home 1-2 days    Subjective:     CC: Migraine and Hypertension    Interval History: headache resolved. Difficult to control BP overnight.  Patient initially on lisinopril by me which was stopped as her Cr is not back to baseline.    titrating Coreg, norvasc added, would prefer weaning off clonidine if possible  Neph consult studies and labs pending      Objective:     Visit Vitals   ??? BP 166/90 (BP 1 Location: Right arm, BP Patient Position: Sitting)    ??? Pulse 80   ??? Temp 98.2 ??F (36.8 ??C)   ??? Resp 16   ??? Ht  (1.651 m)   ??? Wt 71.7 kg (158 lb)   ??? LMP 06/22/2015   ??? SpO2 100%   ??? Breastfeeding No   ??? BMI 26.29 kg/m2       Temp (24hrs), Avg:98.4 ??F (36.9 ??C), Min:97.6 ??F (36.4 ??C), Max:99.4 ??F (37.4 ??C)        Intake/Output Summary (Last 24 hours) at 07/10/15 1400  Last data filed at 07/09/15 1800   Gross per 24 hour   Intake            187.5 ml   Output              600 ml   Net           -412.5 ml       Gen: AOx3, NAD  HEENT:  PERL, EOMI.   Neck: No Bruits/JVD   Lungs:   CTAB. Good respiratory effort  Heart:   RR S1 S2 without M/R/G  Abdomen: ND,NT, BSX4,   Extremities:   No LE edema. No cyanosis.  Skin:  no jaundice/lesions      Data Review:     Meds/Labs/Tests reviewed    Current Shift:     Last three shifts:  02/26  1901 - 02/28 0700  In: 2888.3 [P.O.:240; I.V.:2648.3]  Out: 1100 [Urine:1100]  Recent Labs      07/08/15   1430  07/07/15   1713   WBC  16.8*  15.9*   RBC  4.60  5.30   HGB  13.0  15.1   HCT  37.0  41.7   PLT  193  268   GRANS   --   86*   LYMPH   --   10*   EOS   --   0       Recent Labs      07/09/15   0215  07/08/15   1430  07/08/15   0521  07/07/15   1713   BUN  30*  25*  23*  24*   CREA  3.52*   --   2.95*  3.38*   CA  8.3*   --   8.5  9.4   ALB   --   3.4   --   4.5   K  3.2*   3.2*   --   3.1*  3.3*   NA  140   --   136  136   CL  103   --   101  98*   CO2  22   --   26  28   PHOS  3.8  3.3   --    --    GLU  111*   --   137*  129*        Lab Results   Component Value Date/Time    Glucose 111 07/09/2015 02:15 AM    Glucose 137 07/08/2015 05:21 AM    Glucose 129 07/07/2015 05:13 PM    Glucose 88 11/22/2013 07:55 PM    Glucose 96 08/05/2012 08:40 AM          Care coordination with Nursing/Consultants/staff: 5  Prior history, labs, and charting reviewed: 15    Procedures/Imaging:  2/27- -TTE EF50-55% LVH, grade2dd  2/28 Renal US    Total time spent with chart review, patient examination/education,  discussion with staff on case,documentation and medication management / adjustment  :  30 Minutes      Dr Malka So DO  [facility]  Hospitalist Division  Pager: [phone]

## 2015-07-10 NOTE — Procedures (Signed)
[facility]  *** FINAL REPORT ***    Name: Bethany Keller, Bethany Keller  MRN: [id number]    Inpatient  DOB: 10 Jul 1980  HIS Order #: [id number]  TRAKnet Visit #: [id number]  Date: 10 Jul 2015    TYPE OF TEST: Visceral Arterial Duplex    REASON FOR TEST  Malignant HTN    Aortic PSV:  91.0 cm/s  Diameter AP:     cm   TV:     cm                   Right          Left  Renal Artery:- -------------  -------------  Proximal  PSV:  82.0           92.0  Mid       PSV:  83.0           84.0  Distal    PSV:  54.0           69.0  Aortic ratio :   0.9            1.0    Medullary PSV:  23.0           14.0            EDV:   9.0            5.7            EDR:   0.4            0.4            SDR:   2.6            2.5    Cortical  PSV:            EDV:            EDR:            SDR:  Stenosis:      Normal         Normal  Kidney size:   10.4 cm        10.1 cm               x  5.3 cm      x  4.4 cm    Hilar:-        Right          Left  Acc. Time  AT:     secs           secs  Acc. Index AI:             RI: 0.61           0.59    INTERPRETATION/FINDINGS  Duplex images were obtained using 2-D gray scale, color flow, and  spectral Doppler analysis.  RENAL:  1. No significant renal artery stenosis identified, both renal  arteries visualized. Renal artery origin velocities are normal  bilaterally as well, right 87 cm/s; left 90 cm/s.  2. The right kidney measures 10.4 x 5.3 cm.  3. The left kidney measures 10.1 x 4.4 cm.  4. The renal veins are patent bilaterally.  *Technically difficult, limited examination with suboptimal imaging  due to excessive overlying bowel gas; patient was not NPO.    ADDITIONAL COMMENTS    I have personally reviewed the data relevant to the interpretation of  this  study.    TECHNOLOGIST: Lucretia D. Martin, RVS  Signed: 07/10/2015   09:46 AM    PHYSICIAN: Starleen Trussell MD  Signed: 07/10/2015 04:30 PM

## 2015-07-10 NOTE — Other (Signed)
6962 assumed care of pt after bedside verbal report was given by off going nurse, pt resting in bed awake, lactated ringers infusing at 75 ml/hr, no acute distress noted, will monitor     0902 pt off unit for vascular study    1010 pt back from test, no distress noted, will monitor     1035 manual bp 168/90, pt sitting up awake in bed, no distress noted, will monitor     1203 pt eating lunch, fiance at bedside, no changes    1738 pt watching tv, no changes noted, pt denies pain    1947 Bedside and Verbal shift change report given to M. Williams,RN (Cabin crew) by T.Johnson,RN Physiological scientist). Report included the following information SBAR, Kardex and MAR.

## 2015-07-10 NOTE — Other (Addendum)
Verbal bedside report rec'd from [employer]. Report included the following information SBAR, Kardex,  Intake/Output, MAR, Recent Results and POC. Pt denies pain.  1910 Assessment completed. V/S WNL. Pt denies discomfort.  0000 No changes in assessment.  0400 BP remain WNL.    Verbal bedside report given to [facility]. Report included the following information SBAR, Kardex,  Intake/Output, MAR, Recent Results and POC. Pt denies pain. Husband at bedside.

## 2015-07-10 NOTE — Consults (Signed)
Consult Note    Assessment:   ?? AKI secondary to htn emergency and volume depletion (due to n/v pta). No improvement in renal function at this point. In fact there may be a significant element of underlying ckd. No baseline labs over last 1.5 years. Presents of proteinuria and hematuria also point towards ckd.   ?? Proteinuria, needs to be quantified.   ?? Hypokalemia.   ?? HTN. Better controlled. Hypertensive emergency has resolved. Agree that patient needs w/u of secondary hypertension. Hypokalemia may be pointing towards hyperaldosteronism. Renal pvl neg for ras.     Recommendations:   ?? Agree with current antihtn regimen except hold ace i until renal function stabilizes. Restart clonidine.   ?? Reduce ivf.   ?? Quantify proteinuria.   ?? Obtain renal US.   ?? Obtain ana, viral hepatitis profile to screen for secondary cause of proteinuric kidney disease.   ?? F/u on results of aldo/renin ratio, serum metanephrines.   ?? Avoid NSAID's, IV dye.  ?? Avoid Gadolinium due to its association with nephrogenic systemic fibrosis in a patients with severe ARF and ESRD.   ?? Avoid fleets enemas due to concern for acute phosphate nephropathy.   ?? Please dose all medications for approximate creatinine clearance 30-15.      ?? Will f/u in the hospital and office.   Thank you.     Consult requested by: Michaelyn Barter, DO    ADMIT DATE: 07/07/2015  CONSULT DATE: July 10, 2015                 Admission diagnosis: htn emergency.   Reason for Nephrology Consultation: AKI, htn.     HPI: Bethany Keller is a 35 y.o. female BL[place] with h/o of htn since late 20's. Lately bp has been stable with sbp fluctuating in 160-170 range. Patient ran out of clonidine and carvedilol approx 4 days pta. She presented with one day h/o of n/v and ha. BP was very high at home. At ED bp was 227/139. Patient was admitted to icu, bp improved with cardane drip. At this time she is out of icu, feels better. BP is  better controlled with oral medications. Echo is with lvh and diastolic dysfunction. Patient denies prior h/o of kidney disease. In July of 2015 scr was 1.23. Upon admission scr was 3.38. On 2/26 it was 2.95. Yesterday scr was 3.5.        Past Medical History:   Diagnosis Date   ??? Acute parametritis and pelvic cellulitis 10/09/2011   ??? Hypertension 10/09/2011      History reviewed. No pertinent surgical history.    Social History     Social History   ??? Marital status: SINGLE     Spouse name: N/A   ??? Number of children: N/A   ??? Years of education: N/A     Occupational History   ??? Not on file.     Social History Main Topics   ??? Smoking status: Never Smoker   ??? Smokeless tobacco: Not on file   ??? Alcohol use Yes      Comment: socially   ??? Drug use: No   ??? Sexual activity: Yes     Partners: Male     Birth control/ protection: Condom     Other Topics Concern   ??? Not on file     Social History Narrative       History reviewed. No pertinent family history.  No Known  Allergies     Home Medications:     Prescriptions Prior to Admission   Medication Sig   ??? cloNIDine HCl (CATAPRES) 0.2 mg tablet Take 0.2 mg by mouth two (2) times a day.   ??? carvedilol (COREG) 6.25 mg tablet Take 1 Tab by mouth two (2) times daily (with meals). (Patient taking differently: Take 25 mg by mouth two (2) times daily (with meals).)   ??? hydrochlorothiazide (HYDRODIURIL) 25 mg tablet Take 1 Tab by mouth daily.       Current Inpatient Medications:     Current Facility-Administered Medications   Medication Dose Route Frequency   ??? [START ON 07/11/2015] amLODIPine (NORVASC) tablet 10 mg  10 mg Oral DAILY   ??? amLODIPine (NORVASC) tablet 5 mg  5 mg Oral ONCE   ??? promethazine (PHENERGAN) with saline injection 6.25 mg  6.25 mg IntraVENous Q6H PRN   ??? enoxaparin (LOVENOX) injection 30 mg  30 mg SubCUTAneous Q24H   ??? carvedilol (COREG) tablet 25 mg  25 mg Oral BID WITH MEALS   ??? lisinopril (PRINIVIL, ZESTRIL) tablet 40 mg  40 mg Oral DAILY    ??? chlorthalidone (HYGROTEN) tablet 50 mg  50 mg Oral DAILY   ??? lisinopril (PRINIVIL, ZESTRIL) tablet 20 mg  20 mg Oral DIALYSIS ONCE   ??? dextrose 5% lactated ringers infusion  75 mL/hr IntraVENous CONTINUOUS   ??? hydrALAZINE (APRESOLINE) 20 mg/mL injection 10 mg  10 mg IntraVENous Q6H PRN   ??? labetalol (NORMODYNE;TRANDATE) 20 mg/4 mL (5 mg/mL) injection 20 mg  20 mg IntraVENous PRN   ??? sodium chloride (NS) flush 5-10 mL  5-10 mL IntraVENous Q8H   ??? sodium chloride (NS) flush 5-10 mL  5-10 mL IntraVENous PRN   ??? sodium chloride (NS) flush 5-10 mL  5-10 mL IntraVENous Q8H   ??? sodium chloride (NS) flush 5-10 mL  5-10 mL IntraVENous PRN       Review of Systems:   No fever or chills. No sore throat. No cough or hemoptysis. No shortness of breath or chest pain. No orthopnea or paroxysmal nocturnal dyspnea. No palpitations or diaphoresis.  Good appetite. No nausea, vomiting until the onset of present illness. No abdominal pain, melena or hematochezia. No constipation or diarrhea. No dysuria, no gross hematuria of voiding difficulties. No ankle swelling, no joint paints. No muscle aches. No skin changes. No dizziness or lightheadedness.        Physical Assessment:     Vitals:    07/10/15 0520 07/10/15 0648 07/10/15 1010 07/10/15 1035   BP: (!) 186/115 (!) 187/108 (!) 167/112 166/90   Pulse:  92 80    Resp:  20 16    Temp:  98.2 ??F (36.8 ??C) 98.2 ??F (36.8 ??C)    SpO2:  97% 100%    Weight:       Height:         Last 3 Recorded Weights in this Encounter    07/07/15 1557   Weight: 71.7 kg (158 lb)     Admission weight: Weight: 71.7 kg (158 lb) (07/07/15 1557)      Intake/Output Summary (Last 24 hours) at 07/10/15 1110  Last data filed at 07/09/15 1800   Gross per 24 hour   Intake           239.75 ml   Output              600 ml   Net          -  360.25 ml       Patient is in no apparent distress.   HEENT: Head is normocephalic and atraumatic. Pupils are round, equal, reactive to light. Sclerae are anicteric. Oropharynx clear.    Neck: no cervical lymphadenopathy or thyromegaly.   Lungs: good air entry, clear to auscultation bilaterally. Trachea at the midline.   Cardiovascular system: S1, S2, regular rate and rhythm. No murmurs, gallops or rubs. No jvd. Carotid upstroke 2 + bilaterally.   Abdomen: soft, non tender, non distended. Positive bowel sounds. No hepatosplenomegaly. No abdominal bruits.   Extremities: no clubbing, cyanosis or edema. Strong dorsalis pedis pulses. Brisk capillary refill on the toes bilaterally.   Integumentary: skin is grossly intact.   Neurologic: Alert, oriented time three. Cooperative and appropriate. No gross motor or sensory deficits.       Data Review:    Labs: Results:       Chemistry Recent Labs      07/09/15   0215  07/08/15   1430   07/08/15   0521  07/07/15   1713   GLU  111*   --    --   137*  129*   NA  140   --    --   136  136   K  3.2*   3.2*   --    --   3.1*  3.3*   CL  103   --    --   101  98*   CO2  22   --    --   26  28   BUN  30*  25*   --   23*  24*   CREA  3.52*   --    --   2.95*  3.38*   CA  8.3*   --    --   8.5  9.4   AGAP  15   --    --   9  10   BUCR  9*   --    --   8*  7*   AP   --   96   --    --   132*   TP   --   6.7   --    --   8.7*   ALB   --   3.4   --    --   4.5   GLOB   --   3.3   --    --   4.2*   AGRAT   --   1.0   --    --   1.1   PHOS  3.8  3.3   < >   --    --     < > = values in this interval not displayed.         CBC w/Diff Recent Labs      07/08/15   1430  07/07/15   1713   WBC  16.8*  15.9*   RBC  4.60  5.30   HGB  13.0  15.1   HCT  37.0  41.7   PLT  193  268   GRANS   --   86*   LYMPH   --   10*   EOS   --   0         Iron/Ferritin No results for input(s): IRON in the last 72 hours.    No lab exists for component: TIBCCALC   PTH/VIT  D No results for input(s): PTH in the last 72 hours.    No lab exists for component: VITD           Trish Fountain, M.D  [facility]  Office [phone]  Pager [phone]    July 10, 2015

## 2015-07-11 LAB — PROTEIN/CREATININE RATIO, URINE
Creatinine, urine random: 69.2 mg/dL (ref 30–125)
Protein, urine random: 65 mg/dL — ABNORMAL HIGH (ref <11.9)
Protein/Creat. urine Ratio: 0.9

## 2015-07-11 LAB — METABOLIC PANEL, BASIC
Anion gap: 11 mmol/L (ref 3.0–18)
BUN/Creatinine ratio: 10 — ABNORMAL LOW (ref 12–20)
BUN: 32 MG/DL — ABNORMAL HIGH (ref 7.0–18)
CO2: 24 mmol/L (ref 21–32)
Calcium: 7.7 MG/DL — ABNORMAL LOW (ref 8.5–10.1)
Chloride: 105 mmol/L (ref 100–108)
Creatinine: 3.1 MG/DL — ABNORMAL HIGH (ref 0.6–1.3)
GFR est AA: 21 mL/min/{1.73_m2} — ABNORMAL LOW (ref >60)
GFR est non-AA: 17 mL/min/{1.73_m2} — ABNORMAL LOW (ref >60)
Glucose: 86 mg/dL (ref 74–99)
Potassium: 3.3 mmol/L — ABNORMAL LOW (ref 3.5–5.5)
Sodium: 140 mmol/L (ref 136–145)

## 2015-07-11 LAB — CATECHOLAMINES, PLASMA
Dopamine, plasma: 43 pg/mL (ref 0–48)
Epinephrine, plasma: 98 pg/mL — ABNORMAL HIGH (ref 0–62)
Norepinephrine, plasma: 994 pg/mL — ABNORMAL HIGH (ref 0–874)

## 2015-07-11 MED ORDER — CARVEDILOL 25 MG TAB
25 mg | ORAL_TABLET | Freq: Two times a day (BID) | ORAL | 3 refills | Status: DC
Start: 2015-07-11 — End: 2016-03-19

## 2015-07-11 MED ORDER — AMLODIPINE 10 MG TAB
10 mg | ORAL_TABLET | Freq: Every day | ORAL | 3 refills | Status: DC
Start: 2015-07-11 — End: 2016-03-19

## 2015-07-11 MED ORDER — HYDROCHLOROTHIAZIDE 25 MG TAB
25 mg | ORAL_TABLET | Freq: Every day | ORAL | 3 refills | Status: DC
Start: 2015-07-11 — End: 2016-03-19

## 2015-07-11 MED FILL — AMLODIPINE 5 MG TAB: 5 mg | ORAL | Qty: 2

## 2015-07-11 MED FILL — CARVEDILOL 25 MG TAB: 25 mg | ORAL | Qty: 1

## 2015-07-11 MED FILL — CLONIDINE 0.1 MG TAB: 0.1 mg | ORAL | Qty: 2

## 2015-07-11 NOTE — Discharge Instructions (Signed)
[facility]  Discharge Phone Call       After-Care Discharge Phone Call Questions: no answer    Were you able to get your prescriptions filled?    Comment:       Yes  No    Comment if answer is "No"   Are you taking your medication(s) as your doctor ordered? Do you understand the purpose of your medications?  Comment:     Yes  No    Comment if answer is "No"   Are you taking any other medications that are not on the list?  Comment:       Yes  No    Comment if answer is "Yes"   Do you have any questions about your medications?  Comment:     Yes  No    Comment if answer is "Yes"   Did you make your follow-up appointments (if the hospital did not do this before  discharge)?  Comment:     Yes  No    Comment if answer is "No"   Is there any reason you might not be able to keep your follow-up appointments?  Comment:      Yes  No    Comment if answer is "Yes"   Do you have any questions about your care plan?  Comment:     Yes  No    Comment if answer is "Yes"   Do you have a good understanding of how you should manage your health?  Comment:     Yes  No    Comment if answer is "Yes"   Do you know which symptoms to watch for that would mean you would need to call your doctor right away?    Comment:       Yes  No    Comment if answer is "No"   Do you have any questions about the follow up process or any instructions that we have provided?   Comment:     Yes  No    Comment if answer is "Yes"   Did staff take your preferences into account?

## 2015-07-11 NOTE — Other (Signed)
1610 assumed care of pt after bedside verbal report was by off going nurse, pt resting in bed awake with fiance at bedside, no acute distress noted, normal saline infusing at 150 ml/hr, will monitor     1150 pt discharged to home with instructions given on medication, activity, follow up appointments

## 2015-07-11 NOTE — Progress Notes (Signed)
Discharge instructions reviewed with pt on behalf of primary nurse Jaclyn Prime. Prescriptions provided. Questions asked and answered. Pt escorted to car to be driven by family. Pt left in stable condition.

## 2015-07-11 NOTE — Progress Notes (Signed)
RENAL PROGRESS NOTE        Bethany Keller         Assessment/Plan:   ?? AKI secondary to htn emergency and volume depletion. Scr is down slightly but overall I am concerned that patient may have significant element of underlying ckd. No baseline labs over last 1.5 years. Will have to watch where scr will stabilizes.  Presence of proteinuria and hematuria also point towards ckd.  Screening for rheumatic diseases, viral hepatitis as a cause of ckd is in progress. D/c ivf. C  ?? Proteinuria, non nephrotic.    ?? HTN. Better controlled. Hypertensive emergency has resolved. Continue coreg, amlodipine and clonidine at home. W/u of secondary hypertension is in progress- metanephrines, aldo/renin ratio are pending. Hypokalemia may be pointing towards hyperaldosteronism. Renal pvl neg for ras.   ?? Hypokalemia. Monitor, patient is to eat high K diet at home.   ?? If d/ced, patient is to f/u with me in the office in 2 weeks. Importance d/w patient, business card given. She is to call for appt.                                                                                                                                  Subjective:  Patient complaints off: Feels better. No HA. No SOB/CP/N/V.       Patient Active Problem List   Diagnosis Code   ??? Acute parametritis and pelvic cellulitis N73.0   ??? Hypertension I10   ??? Acute renal failure (ARF) (HCC) N17.9   ??? Hypertensive emergency I16.1   ??? Headache R51   ??? Malignant hypertension I10   ??? CKD (chronic kidney disease) stage 3, GFR 30-59 ml/min N18.3       Current Facility-Administered Medications   Medication Dose Route Frequency Provider Last Rate Last Dose   ??? amLODIPine (NORVASC) tablet 10 mg  10 mg Oral DAILY Michaelyn Barter, DO       ??? cloNIDine HCl (CATAPRES) tablet 0.2 mg  0.2 mg Oral Q8H Abundio Teuscher V, MD   0.2 mg at 07/11/15 0600   ??? 0.9% sodium chloride infusion  150 mL/hr IntraVENous CONTINUOUS Michaelyn Barter, DO 150 mL/hr at 07/11/15 0300 150 mL/hr at 07/11/15 0300   ??? hydrALAZINE (APRESOLINE) 20 mg/mL injection 20 mg  20 mg IntraVENous Q6H PRN Michaelyn Barter, DO       ??? promethazine (PHENERGAN) with saline injection 6.25 mg  6.25 mg IntraVENous Q6H PRN Donaciano Eva, MD       ??? enoxaparin (LOVENOX) injection 30 mg  30 mg SubCUTAneous Q24H Dominick A Rascona, MD   30 mg at 07/10/15 1418   ??? carvedilol (COREG) tablet 25 mg  25 mg Oral BID WITH MEALS Dominick A Rascona, MD   25 mg at 07/10/15 1738   ??? labetalol (NORMODYNE;TRANDATE) 20 mg/4 mL (5 mg/mL) injection 20 mg  20 mg IntraVENous PRN Ifeanyichukwu  Darcel Bayley, MD   20 mg at 07/10/15 0450   ??? sodium chloride (NS) flush 5-10 mL  5-10 mL IntraVENous Q8H Darnelle Catalan, MD   Stopped at 07/10/15 2200   ??? sodium chloride (NS) flush 5-10 mL  5-10 mL IntraVENous PRN Darnelle Catalan, MD       ??? sodium chloride (NS) flush 5-10 mL  5-10 mL IntraVENous Q8H Lavone Orn, MD   Stopped at 07/10/15 2200   ??? sodium chloride (NS) flush 5-10 mL  5-10 mL IntraVENous PRN Lavone Orn, MD           Objective  Vitals:    07/10/15 1416 07/10/15 1901 07/10/15 2221 07/11/15 0629   BP: 139/71 146/85 108/69 132/88   Pulse: 81 82 80 71   Resp: Temp: 99.1 ??F (37.3 ??C) 98.7 ??F (37.1 ??C) 98 ??F (36.7 ??C) 98.4 ??F (36.9 ??C)   SpO2: 95% 96% 100% 100%   Weight:    70.1 kg (154 lb 9.6 oz)   Height:             Intake/Output Summary (Last 24 hours) at 07/11/15 0915  Last data filed at 07/10/15 1902   Gross per 24 hour   Intake              480 ml   Output              650 ml   Net             -170 ml           Admission weight: Weight: 71.7 kg (158 lb) (07/07/15 1557)  Last Weight Metrics:  Weight Loss Metrics 07/11/2015 08/05/2012 10/07/2011   Today's Wt 154 lb 9.6 oz 180 lb 180 lb   BMI 25.73 kg/m2 29.95 kg/m2 29.95 kg/m2             Physical Assessment:     General: NAD, alert and oriented.  Neck: No jvd.   LUNGS: Clear to Auscultation, No rales, rhonchi or wheezes.  CVS EXM: S1, S2  RRR, no murmurs/gallops/rubs.  Abdomen: soft, non tender.   Lower Extremities:  no edema.       Lab    CBC w/Diff Recent Labs      07/08/15   1430   WBC  16.8*   RBC  4.60   HGB  13.0   HCT  37.0   PLT  193        Chemistry Recent Labs      07/11/15   0452  07/09/15   0215  07/08/15   1430   GLU  86  111*   --    NA  140  140   --    K  3.3*  3.2*   3.2*   --    CL  105  103   --    CO2  24  22   --    BUN  32*  30*  25*   CREA  3.10*  3.52*   --    CA  7.7*  8.3*   --    AGAP  11  15   --    BUCR  10*  9*   --    AP   --    --   96   TP   --    --   6.7   ALB   --    --  3.4   GLOB   --    --   3.3   AGRAT   --    --   1.0   PHOS   --   3.8  3.3         No results found for: IRON, FE, TIBC, IBCT, PSAT, FERR Lab Results   Component Value Date/Time    Calcium 7.7 07/11/2015 04:52 AM    Phosphorus 3.8 07/09/2015 02:15 AM        Trish Fountain, M.D.  [facility]  Phone [phone]  Pager [phone]

## 2015-07-11 NOTE — Discharge Summary (Addendum)
[facility]  Hospitalist Division    Discharge Summary      Patient: Bethany Keller MRN: [id number]  CSN: [id number]    Date of Birth: 1980/12/29  Age: 35 y.o.  Sex: female    DOA: 07/07/2015 LOS:  LOS: 3 days   Discharge Date: 07/11/15     PCP:  None    Chief Complaint:    Chief Complaint   Patient presents with   ??? Migraine   ??? Hypertension     Malignant hypertension    Admission Diagnosis:   Hospital Problems as of 07/11/2015  Date Reviewed: 10-Oct-2011          Codes Class Noted - Resolved POA    CKD (chronic kidney disease) stage 3, GFR 30-59 ml/min ICD-10-CM: N18.3  ICD-9-CM: 585.3  07/10/2015 - Present Unknown        Acute renal failure (ARF) (HCC) ICD-10-CM: N17.9  ICD-9-CM: 584.9  07/08/2015 - Present Unknown        Hypertensive emergency ICD-10-CM: I16.1  ICD-9-CM: 401.9  07/08/2015 - Present         Headache ICD-10-CM: R51  ICD-9-CM: 784.0  07/08/2015 - Present Unknown        * (Principal)Malignant hypertension ICD-10-CM: I10  ICD-9-CM: 401.0  07/08/2015 - Present Unknown              Discharge Diagnoses:    Malignant hypertension/Hypertensive crisis: TTE with diastolic dysfunction and LVH.   -On iv cardene and labetalol-off  -Decrease BP Plan for <160 today  -TTE EF50-55% LVH, grade2dd  -will need better BP control long term. Clonidine is a poor option  -Renin level ordered for 2' HTN workup on presentation. Possibly hyper aldosterone   AKI on CKD  Creatinine imprroved initially, then BUN/Cr up after IVF stopped. NS started @150  2/27  2/28 BMP pending  Urine studies. Proteinuria eval, Renal US  Elevated troponin:likely demand ischemia due to HTN  -resolving. Due to hypertensive demand  -TSH wnl  Headaches  -On tylenol    Hospital Course:   Admitted with malignant hypertension and AKI after she presented to ER with headaches and hugh blood pressure.Did not take her bp meds for a while.She is on cardene and labetalol infusion.Also on iv fluid.Critical  care involved. TTE preserved EF hoever LVH and grade 2 diastolic dysfunction. Creatine worsening 2/27 IVF restarted. Bp has been difficult to manage. Nephrology consulted there is likely CKD present.  Patient had very difficult to manage hypertension. She was started on norvasc and had to be continued on clonidine. Coreg titrated to 25mg  BID.  Diuretics titrated but to be held on discharge.   Cr peaked at 3.5 then improved to 3.1 and patient demanded to be discharged. Non-compliant with labs.  She will follow-up with nephrology.   BP132/88 and 151/105 on discharge.    Significant Diagnostic Studies:  2/27- -TTE EF50-55% LVH, grade2dd  2/28 Renal US    Consults:  Renal  Pulm CC    Operative Procedures:  none    Discharge Condition:   Good    Discharge Medications:    Current Discharge Medication List      START taking these medications    Details   amLODIPine (NORVASC) 10 mg tablet Take 1 Tab by mouth daily.  Qty: 90 Tab, Refills: 3         CONTINUE these medications which have CHANGED    Details   carvedilol (COREG) 25 mg tablet Take 1 Tab by mouth two (  2) times daily (with meals).  Qty: 180 Tab, Refills: 3      hydroCHLOROthiazide (HYDRODIURIL) 25 mg tablet Take 2 Tabs by mouth daily. Do not restart till 07/18/2015  Qty: 90 Tab, Refills: 3         CONTINUE these medications which have NOT CHANGED    Details   cloNIDine HCl (CATAPRES) 0.2 mg tablet Take 0.2 mg by mouth two (2) times a day.               Follow-Up And Discharge Instructions:   Follow-up Information     Follow up With Details Comments Contact Info    Please find a PCP       Dicie Beam, MD Schedule an appointment as soon as possible for a visit Nephrology follow-up [address]            [employer]  [address]               [address]  [place] [address]  [phone]            Wound Care:   NA      Dr Malka So DO  [facility]  Hospitalist Division        Time Spent:  29m    Cc: None

## 2015-07-12 LAB — ANA BY MULTIPLEX FLOW IA, QL
ANA, Direct: NEGATIVE
ANA: NEGATIVE

## 2015-07-12 LAB — HEPATITIS C ANTIBODY
HCV Ab: 0.08 Index (ref <0.80)
Hepatitis C Ab: NEGATIVE

## 2015-07-12 LAB — HEPATITIS B SURFACE ANTIBODY
Hep B S Ab Interp: NEGATIVE — AB
Hep B S Ab: <3.1 m[IU]/mL — ABNORMAL LOW (ref >10.0)

## 2015-07-12 LAB — HEP B SURFACE AB
Hep B surface Ab Interp.: NEGATIVE — AB
Hepatitis B surface Ab: <3.1 m[IU]/mL — ABNORMAL LOW (ref >10.0)

## 2015-07-12 LAB — HEP B SURFACE AG
Hep B surface Ag Interp.: NEGATIVE
Hepatitis B surface Ag: <0.1 Index (ref <1.00)

## 2015-07-12 LAB — RENIN ACTIVITY: Renin Activity: 26.335 ng/mL/hr — ABNORMAL HIGH (ref 0.167–5.380)

## 2015-07-12 LAB — HEPATITIS C AB
Hep C virus Ab Interp.: NEGATIVE
Hepatitis C virus Ab: 0.08 Index (ref <0.80)

## 2015-07-12 LAB — ALDOSTERONE: Aldosterone: 31.5 ng/dL — ABNORMAL HIGH (ref 0.0–30.0)

## 2016-03-17 ENCOUNTER — Inpatient Hospital Stay
Admit: 2016-03-17 | Discharge: 2016-03-19 | Disposition: A | Discharge status: Home / Self Care | Payer: Self-pay | Attending: Family Medicine | Admitting: Family Medicine

## 2016-03-17 ENCOUNTER — Inpatient Hospital Stay: Admit: 2016-03-18 | Payer: Self-pay | Primary: Internal Medicine

## 2016-03-17 DIAGNOSIS — I120 Hypertensive chronic kidney disease with stage 5 chronic kidney disease or end stage renal disease: Secondary | ICD-10-CM

## 2016-03-17 LAB — METABOLIC PANEL, COMPREHENSIVE
A-G Ratio: 0.8 (ref 0.8–1.7)
ALT (SGPT): 21 U/L (ref 13–56)
AST (SGOT): 51 U/L — ABNORMAL HIGH (ref 15–37)
Albumin: 3.5 g/dL (ref 3.4–5.0)
Alk. phosphatase: 98 U/L (ref 45–117)
Anion gap: 10 mmol/L (ref 3.0–18)
BUN/Creatinine ratio: 10 — ABNORMAL LOW (ref 12–20)
BUN: 47 MG/DL — ABNORMAL HIGH (ref 7.0–18)
Bilirubin, total: 0.7 MG/DL (ref 0.2–1.0)
CO2: 30 mmol/L (ref 21–32)
Calcium: 8.9 MG/DL (ref 8.5–10.1)
Chloride: 95 mmol/L — ABNORMAL LOW (ref 100–108)
Creatinine: 4.8 MG/DL — ABNORMAL HIGH (ref 0.6–1.3)
GFR est AA: 13 mL/min/{1.73_m2} — ABNORMAL LOW (ref >60)
GFR est non-AA: 10 mL/min/{1.73_m2} — ABNORMAL LOW (ref >60)
Globulin: 4.4 g/dL — ABNORMAL HIGH (ref 2.0–4.0)
Glucose: 139 mg/dL — ABNORMAL HIGH (ref 74–99)
Potassium: 3.9 mmol/L (ref 3.5–5.5)
Protein, total: 7.9 g/dL (ref 6.4–8.2)
Sodium: 135 mmol/L — ABNORMAL LOW (ref 136–145)

## 2016-03-17 LAB — CBC WITH AUTOMATED DIFF
ABS. BASOPHILS: 0 10*3/uL (ref 0.0–0.06)
ABS. EOSINOPHILS: 0 10*3/uL (ref 0.0–0.4)
ABS. LYMPHOCYTES: 0.8 10*3/uL — ABNORMAL LOW (ref 0.9–3.6)
ABS. MONOCYTES: 0.3 10*3/uL (ref 0.05–1.2)
ABS. NEUTROPHILS: 16.2 10*3/uL — ABNORMAL HIGH (ref 1.8–8.0)
BASOPHILS: 0 % (ref 0–2)
EOSINOPHILS: 0 % (ref 0–5)
HCT: 37.5 % (ref 35.0–45.0)
HGB: 13.8 g/dL (ref 12.0–16.0)
LYMPHOCYTES: 5 % — ABNORMAL LOW (ref 21–52)
MCH: 28.9 PG (ref 24.0–34.0)
MCHC: 36.8 g/dL (ref 31.0–37.0)
MCV: 78.5 FL (ref 74.0–97.0)
MONOCYTES: 2 % — ABNORMAL LOW (ref 3–10)
MPV: 9.4 FL (ref 9.2–11.8)
NEUTROPHILS: 93 % — ABNORMAL HIGH (ref 40–73)
PLATELET: 169 10*3/uL (ref 135–420)
RBC: 4.78 M/uL (ref 4.20–5.30)
RDW: 13.7 % (ref 11.6–14.5)
WBC: 17.3 10*3/uL — ABNORMAL HIGH (ref 4.6–13.2)

## 2016-03-17 MED ORDER — MORPHINE 10 MG/ML INJ SOLUTION
10 mg/ml | INTRAMUSCULAR | Status: AC
Start: 2016-03-17 — End: 2016-03-17
  Administered 2016-03-17: 15:00:00 via INTRAVENOUS

## 2016-03-17 MED ORDER — AMLODIPINE 10 MG TAB
10 mg | Freq: Every day | ORAL | Status: DC
Start: 2016-03-17 — End: 2016-03-19
  Administered 2016-03-17 – 2016-03-19 (×3): via ORAL

## 2016-03-17 MED ORDER — ACETAMINOPHEN (TYLENOL) SOLUTION 32MG/ML
Freq: Four times a day (QID) | ORAL | Status: DC | PRN
Start: 2016-03-17 — End: 2016-03-19

## 2016-03-17 MED ORDER — CLONIDINE 0.1 MG TAB
0.1 mg | ORAL | Status: AC
Start: 2016-03-17 — End: 2016-03-17
  Administered 2016-03-17: 15:00:00 via ORAL

## 2016-03-17 MED ORDER — SODIUM CHLORIDE 0.9% BOLUS IV
0.9 % | Freq: Once | INTRAVENOUS | Status: AC
Start: 2016-03-17 — End: 2016-03-17
  Administered 2016-03-17: 15:00:00 via INTRAVENOUS

## 2016-03-17 MED ORDER — LABETALOL 5 MG/ML IV SOLN
5 mg/mL | INTRAVENOUS | Status: DC | PRN
Start: 2016-03-17 — End: 2016-03-19
  Administered 2016-03-17 (×2): via INTRAVENOUS

## 2016-03-17 MED ORDER — ONDANSETRON 4 MG TAB, RAPID DISSOLVE
4 mg | Freq: Four times a day (QID) | ORAL | Status: DC | PRN
Start: 2016-03-17 — End: 2016-03-19

## 2016-03-17 MED ORDER — SODIUM CHLORIDE 0.9 % IV
25 mg/mL | Freq: Once | INTRAVENOUS | Status: AC
Start: 2016-03-17 — End: 2016-03-17
  Administered 2016-03-17: 17:00:00 via INTRAVENOUS

## 2016-03-17 MED ORDER — LORAZEPAM 1 MG TAB
1 mg | Freq: Once | ORAL | Status: DC | PRN
Start: 2016-03-17 — End: 2016-03-19

## 2016-03-17 MED ORDER — CLONIDINE 0.1 MG TAB
0.1 mg | ORAL | Status: AC
Start: 2016-03-17 — End: 2016-03-17
  Administered 2016-03-17: 18:00:00 via ORAL

## 2016-03-17 MED ORDER — CLONIDINE 0.1 MG TAB
0.1 mg | Freq: Two times a day (BID) | ORAL | Status: DC
Start: 2016-03-17 — End: 2016-03-17

## 2016-03-17 MED ORDER — HEPARIN (PORCINE) 5,000 UNIT/ML IJ SOLN
5000 unit/mL | Freq: Three times a day (TID) | INTRAMUSCULAR | Status: DC
Start: 2016-03-17 — End: 2016-03-19
  Administered 2016-03-17 – 2016-03-19 (×4): via SUBCUTANEOUS

## 2016-03-17 MED ORDER — CLONIDINE 0.1 MG TAB
0.1 mg | Freq: Three times a day (TID) | ORAL | Status: DC
Start: 2016-03-17 — End: 2016-03-18
  Administered 2016-03-18 (×2): via ORAL

## 2016-03-17 MED ORDER — CARVEDILOL 12.5 MG TAB
12.5 mg | ORAL | Status: AC
Start: 2016-03-17 — End: 2016-03-17
  Administered 2016-03-17: 18:00:00 via ORAL

## 2016-03-17 MED ORDER — AMLODIPINE 5 MG TAB
5 mg | Freq: Every day | ORAL | Status: DC
Start: 2016-03-17 — End: 2016-03-17
  Administered 2016-03-17: 18:00:00 via ORAL

## 2016-03-17 MED ORDER — ONDANSETRON (PF) 4 MG/2 ML INJECTION
4 mg/2 mL | INTRAMUSCULAR | Status: AC
Start: 2016-03-17 — End: 2016-03-17
  Administered 2016-03-17: 15:00:00 via INTRAVENOUS

## 2016-03-17 MED ORDER — HYDROCHLOROTHIAZIDE 25 MG TAB
25 mg | Freq: Every day | ORAL | Status: DC
Start: 2016-03-17 — End: 2016-03-17
  Administered 2016-03-17: 18:00:00 via ORAL

## 2016-03-17 MED ORDER — HYDROCHLOROTHIAZIDE 25 MG TAB
25 mg | Freq: Every day | ORAL | Status: DC
Start: 2016-03-17 — End: 2016-03-17

## 2016-03-17 MED ORDER — AMLODIPINE 10 MG TAB
10 mg | Freq: Every day | ORAL | Status: DC
Start: 2016-03-17 — End: 2016-03-17

## 2016-03-17 MED ORDER — CARVEDILOL 25 MG TAB
25 mg | Freq: Two times a day (BID) | ORAL | Status: DC
Start: 2016-03-17 — End: 2016-03-19
  Administered 2016-03-17 – 2016-03-19 (×4): via ORAL

## 2016-03-17 MED ORDER — SODIUM CHLORIDE 0.9 % IV
INTRAVENOUS | Status: DC
Start: 2016-03-17 — End: 2016-03-19
  Administered 2016-03-17 – 2016-03-18 (×3): via INTRAVENOUS

## 2016-03-17 MED FILL — HEPARIN (PORCINE) 5,000 UNIT/ML IJ SOLN: 5000 unit/mL | INTRAMUSCULAR | Qty: 1

## 2016-03-17 MED FILL — LABETALOL 5 MG/ML IV SOLN: 5 mg/mL | INTRAVENOUS | Qty: 40

## 2016-03-17 MED FILL — MORPHINE 10 MG/ML IJ SOLN: 10 mg/mL | INTRAMUSCULAR | Qty: 1

## 2016-03-17 MED FILL — CLONIDINE 0.1 MG TAB: 0.1 mg | ORAL | Qty: 1

## 2016-03-17 MED FILL — ONDANSETRON (PF) 4 MG/2 ML INJECTION: 4 mg/2 mL | INTRAMUSCULAR | Qty: 2

## 2016-03-17 MED FILL — CARVEDILOL 12.5 MG TAB: 12.5 mg | ORAL | Qty: 2

## 2016-03-17 MED FILL — SODIUM CHLORIDE 0.9 % IV: INTRAVENOUS | Qty: 1000

## 2016-03-17 MED FILL — AMLODIPINE 5 MG TAB: 5 mg | ORAL | Qty: 2

## 2016-03-17 MED FILL — HYDROCHLOROTHIAZIDE 25 MG TAB: 25 mg | ORAL | Qty: 1

## 2016-03-17 MED FILL — PROMETHAZINE 25 MG/ML INJECTION: 25 mg/mL | INTRAMUSCULAR | Qty: 1

## 2016-03-17 MED FILL — CARVEDILOL 25 MG TAB: 25 mg | ORAL | Qty: 1

## 2016-03-17 NOTE — Consults (Signed)
Consults  by Dicie BeamMirovski, Keelen Quevedo V, MD at 03/17/16 1645                Author: Dicie BeamMirovski, Ivo Moga V, MD  Service: Nephrology  Author Type: Physician       Filed: 03/17/16 1714  Date of Service: 03/17/16 1645  Status: Signed          Editor: Dicie BeamMirovski, Ayari Liwanag V, MD (Physician)            Consult Orders        1. IP CONSULT TO NEPHROLOGY [[id number]] ordered by Michaelyn Barterhompson, James Andrew, DO at 03/17/16 1313                                            Consult Note      Assessment:    ??  AKI on CKD 4. Etio- htn emergency. There may be element of volume depletion due to n/v. On the other hand component of aki may be minimal and  current scr may reflect progression of underlying ckd. No labs between march of this year and now. Only time will tell.    ??  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    ??  HTN emergency. Likely is precipitated by n/v and inability to keep bp medications down. W/u for secondary causes of htn was neg in the recent past.    ??  N/V/abd pain. Etio? Acute gastroenteritis? Doubt manifestation of uremia.       Recommendations:    ??  Continue gentle ivf.    ??  Change clonidine to every 8 hous. Continue coreg and amlodipine. Continue labetalol IV as needed. Avoid too rapid reduction in bp.    ??  Obtain ua.    ??  Quantify proteinuria.    ??  Obtain urine drug screen.   ??  Obtain pth.    ??  Avoid NSAID's, IV dye.   ??  Avoid Gadolinium due to its association with nephrogenic systemic fibrosis in a patients with severe ARF and ESRD.    ??  Avoid fleets enemas due to concern for acute phosphate nephropathy.    ??  Please dose all medications for approximate creatinine clearance        ??  Avoid PICC lines on either arm in order to preserve veins for dialysis access creation. Patient is at high risk for eventual progression to  dialysis.     Thank you.       Consult requested by: Michaelyn BarterJames Andrew Thompson, DO      ADMIT DATE: 03/17/2016   CONSULT DATE: March 17, 2016                       Admission diagnosis: severe htn.    Reason for Nephrology Consultation: AKI on CKD.       HPI: Bethany Keller is a  35 y.o. female BLACK OR AFRICAN  AMERICAN with long h/o of htn who is known to me from admission to [facility] at the end of February when she was seen for aki in a setting of htn emergency. Patient f/u in the office once in august but  never had blood work repeated. [facility] with 3 days h/o of n/v and abd discomfort. She couldn't keep her bp medications down. She also  c/o of HA for which she has been taking Excedrin. In ED bp was 215/165. BP improved with labetalol  IV and overall patient is feeling better. Scr was 3.1 at the time of d/c on 07/11/2015. Today scr is up to 4.8.            Past Medical History:        Diagnosis  Date         ?  Acute parametritis and pelvic cellulitis  10/09/2011     ?  Chronic kidney disease           ?  Hypertension  10/09/2011         History reviewed. No pertinent surgical history.       Social History          Social History         ?  Marital status:  SINGLE              Spouse name:  N/A         ?  Number of children:  N/A         ?  Years of education:  N/A          Occupational History        ?  Not on file.          Social History Main Topics         ?  Smoking status:  Never Smoker     ?  Smokeless tobacco:  Not on file         ?  Alcohol use  Yes                Comment: socially         ?  Drug use:  No     ?  Sexual activity:  Yes              Partners:  Male              Birth control/ protection:  Condom           Other Topics  Concern        ?  Not on file          Social History Narrative          History reviewed. No pertinent family history.   No Known Allergies         Home Medications:          Prescriptions Prior to Admission        Medication  Sig         ?  amLODIPine (NORVASC) 10 mg tablet  Take 1 Tab by mouth daily.     ?  hydroCHLOROthiazide (HYDRODIURIL) 25 mg tablet  Take 2 Tabs by mouth daily. Do not restart till 07/18/2015      ?  carvedilol (COREG) 25 mg tablet  Take 1 Tab by mouth two (2) times daily (with meals).         ?  cloNIDine HCl (CATAPRES) 0.2 mg tablet  Take 0.2 mg by mouth two (2) times a day.             Current Inpatient Medications:          Current Facility-Administered Medications          Medication  Dose  Route  Frequency           ?  amLODIPine (NORVASC) tablet 10  mg   10 mg  Oral  DAILY           ?  labetalol (NORMODYNE;TRANDATE) injection 40 mg   40 mg  IntraVENous  Q2H PRN     ?  LORazepam (ATIVAN) tablet 1 mg   1 mg  Oral  ONCE PRN     ?  cloNIDine HCl (CATAPRES) tablet 0.2 mg   0.2 mg  Oral  BID     ?  carvedilol (COREG) tablet 25 mg   25 mg  Oral  BID WITH MEALS     ?  0.9% sodium chloride infusion   75 mL/hr  IntraVENous  CONTINUOUS     ?  acetaminophen (TYLENOL) solution 650 mg   650 mg  Oral  Q6H PRN     ?  ondansetron (ZOFRAN ODT) tablet 4 mg   4 mg  Oral  Q6H PRN           ?  heparin (porcine) injection 5,000 Units   5,000 Units  SubCUTAneous  Q8H             Review of Systems:     No fever or chills. No sore throat. No cough or hemoptysis. No shortness of breath or chest pain. No orthopnea or paroxysmal nocturnal dyspnea. No melena or hematochezia.  No constipation or diarrhea. No dysuria, no gross hematuria of voiding difficulties. No ankle swelling, no joint paints. No muscle aches. No skin changes. No dizziness or lightheadedness.             Physical Assessment:          Vitals:             03/17/16 1345  03/17/16 1400  03/17/16 1415  03/17/16 1506           BP:  (!) 235/162  (!) 220/156  (!) 226/145  (!) 210/147     Pulse:  97  98  99  87     Resp:  18  19  21  20      Temp:        98.6 ??F (37 ??C)     SpO2:  99%  99%  97%  95%     Weight:                   Height:                  Last 3 Recorded Weights in this Encounter          03/17/16 0729        Weight:  72.6 kg (160 lb)        Admission weight: Weight: 72.6 kg (160 lb) (03/17/16 0729)      No intake or output data in the 24 hours ending 03/17/16  1645      Patient is in no apparent distress.    HEENT: Head is normocephalic and atraumatic. Pupils are round, equal, reactive to light. Sclerae are anicteric. Oropharynx clear.    Neck: no cervical lymphadenopathy or thyromegaly.    Lungs: good air entry, clear to auscultation bilaterally. Trachea at the midline. Cardiovascular system: S1, S2, regular rate and rhythm. No murmurs, gallops or rubs. No jvd. Carotid upstroke 2 + bilaterally.    Abdomen: soft, minimal tenderness in the periumbilical area, non distended. No g/r.  Positive bowel sounds. No hepatosplenomegaly. No abdominal bruits.    Extremities: no clubbing, cyanosis. Trace bl le edema. Strong dorsalis  pedis pulses. Brisk capillary refill on the toes bilaterally.    Integumentary: skin is grossly intact.    Neurologic: Alert, oriented time three. Cooperative and appropriate. No gross motor or sensory deficits.          Data Review:         Labs:  Results:                  Chemistry  Recent Labs          03/17/16    0955      GLU   139*      NA   135*      K   3.9      CL   95*      CO2   30      BUN   47*      CREA   4.80*      CA   8.9      AGAP   10      BUCR   10*      AP   98      TP   7.9      ALB   3.5      GLOB   4.4*      AGRAT   0.8                    CBC w/Diff  Recent Labs          03/17/16    0955      WBC   17.3*      RBC   4.78      HGB   13.8      HCT   37.5      PLT   169      GRANS   93*      LYMPH   5*      EOS   0                    Iron/Ferritin  No results for input(s): IRON in the last 72 hours.      No lab exists for component: TIBCCALC     PTH/VIT D  No results for input(s): PTH in the last 72 hours.      No lab exists for component: VITD                  Trish Fountain, M.D   [facility]   Office [phone]   Pager [phone]      March 17, 2016

## 2016-03-17 NOTE — ED Notes (Signed)
Informed Bethany Keller patient still with elevated BP.

## 2016-03-17 NOTE — ED Notes (Signed)
Patient states I  Am feeling better.

## 2016-03-17 NOTE — ED Notes (Signed)
Patient  Unable   To void refuses to be cathed.

## 2016-03-17 NOTE — Progress Notes (Signed)
1600:  Required Doc's completed, telemetry applied and verified with MT.    1700:  IV fluids started, Medications given per order; will continue to monitor.    1800:  Will continue to monitor patient BP as it continue to lower after administering BP medications at prescribed.    1930:  Assisted patient to BR to urinate, urine provided for urinalysis tests.  Tech at beside for renal U/S.    Bedside and Verbal shift change report given to Rush FarmerAlelie Veranga, RN (oncoming nurse) by Deborra MedinaKristie Dotson-Owen, RN (offgoing nurse). Report included the following information SBAR, Kardex, MAR and Recent Results.

## 2016-03-17 NOTE — ED Notes (Signed)
Patient still refusing cath and urine specimen.

## 2016-03-17 NOTE — ED Notes (Signed)
Patient with elevated     BP informed Darren PA no order received.

## 2016-03-17 NOTE — ED Notes (Signed)
Patient taken to 3004. Alert and oriented. Significant other at bedside. Multidose vial of Labetalol provided to receiving nurse who was also made aware of patient's arrival. No acute distress noted to patient upon leaving. Needs met. Belongings taken to room by significant other.

## 2016-03-17 NOTE — H&P (Signed)
History and Physical    Patient: Bethany Keller               Sex: female          DOA: 03/17/2016       Date of Birth:  [birth year]      Age:  35 y.o.            Assessment/Plan     Malignant hypertension/Hypertensive crisis:   -restart home meds. Add labetolol for SBP>170, may need drip  -Decrease BP Plan for <180 today, <160 tomorrow  -TTE EF50-55% LVH, grade2dd  -will need better BP control long term. Clonidine is a poor option  -Renin level ordered for 2' HTN workup on presentation. Possibly hyper aldosterone     AKI on CKD  -Cr increased from low-mid 3's to 4.8  -NS @75 /hr  -Urine studies. Proteinuria eval, Renal US ordered last visit  -Renal consult. Consider HD eval. Likely needs phosphate binder, active Vit D, protect HD arm    N/V and abdominal pain  -Etiology unclear, patient thinks it may be related to her kidneys    Headaches  -On tylenol prn    Code status: Full  PPX:  DVT: Heparin  GI: None indicated    HPI:     Chief Complaint   Patient presents with   ??? Vomiting       Bethany Keller is a 35 y.o. female with PMHX of Headaches, CKD3/4, resistant HTN who presents with 3 days of mild abdominal pain with N/V.  She denies any F/C or diarrhea.  She reports compliance with her 3 medications including coreg, norvasc, and HCTZ, does not appear to have been taking clonidine.  She did follow up with Dr Deniece Ree after discharge back in 06/2015 for similar admission and has a planned follow-up.  She checks BP at home occasionaly reports 160-180/100 readings.   She has not urinated while in ER.  In ER BP210/130 on admission she was given her home medications and labetolol IV.    Past Medical History:   Diagnosis Date   ??? Acute parametritis and pelvic cellulitis 10/09/2011   ??? Chronic kidney disease    ??? Hypertension 10/09/2011       Prior to Admission Medications   Prescriptions Last Dose Informant Patient Reported? Taking?   amLODIPine (NORVASC) 10 mg tablet   No No   Sig: Take 1 Tab by mouth daily.    carvedilol (COREG) 25 mg tablet   No No   Sig: Take 1 Tab by mouth two (2) times daily (with meals).   cloNIDine HCl (CATAPRES) 0.2 mg tablet   Yes No   Sig: Take 0.2 mg by mouth two (2) times a day.   hydroCHLOROthiazide (HYDRODIURIL) 25 mg tablet   No No   Sig: Take 2 Tabs by mouth daily. Do not restart till 07/18/2015      Facility-Administered Medications: None       Social History:  Social History     Social History   ??? Marital status: SINGLE     Spouse name: N/A   ??? Number of children: N/A   ??? Years of education: N/A     Occupational History   ??? Not on file.     Social History Main Topics   ??? Smoking status: Never Smoker   ??? Smokeless tobacco: Not on file   ??? Alcohol use Yes      Comment: socially   ??? Drug use: No   ???  Sexual activity: Yes     Partners: Male     Birth control/ protection: Condom     Other Topics Concern   ??? Not on file     Social History Narrative       Family History:  History reviewed. No pertinent family history.    Surgical History:  History reviewed. No pertinent surgical history.    Review of Systems  Constitutional:  No fever or weight loss  HEENT:  + headache no visual changes  Cardiovascular:  No chest pain or diaphoresis  Respiratory:  No coughing, wheezing, or shortness of breath.  GI:  HPI. No diarrhea  GU:  No hematuria or dysuria  Skin:  No rashes or moles  Neuro:  No seizures or syncope  Hematological:  No bruising or bleeding  Endocrine:  No diabetes or thyroid disease    Physical Exam:      Visit Vitals   ??? BP (!) 208/152   ??? Pulse 90   ??? Temp 97.9 ??F (36.6 ??C)   ??? Resp 15   ??? Ht 5' 5"  (1.651 m)   ??? Wt 72.6 kg (160 lb)   ??? SpO2 100%   ??? BMI 26.63 kg/m2       Physical Exam:  Gen:  No distress, alert  HEENT:  Normal cephalic atraumatic, extra-occular movements are intact.  Neck:  Supple, No JVD  Lungs:  Clear bilaterally, no wheeze, no rales, normal effort  Heart:  Regular Rate and Rhythm, loud S1 and S2,   Abdomen:  Soft, non tender, normal bowel sounds, no guarding.   Extremities:  Well perfused, no cyanosis or trace RLE edema  Neurological:  Awake and alert, CN's are intact, normal strength throughout extremities  Skin:  No rashes or moles  Psych:  Normal thought process, does not appear anxious    Laboratory Studies:  All lab results for the last 24 hours reviewed.  Recent Results (from the past 12 hour(s))   CBC WITH AUTOMATED DIFF    Collection Time: 03/17/16  9:55 AM   Result Value Ref Range    WBC 17.3 (H) 4.6 - 13.2 K/uL    RBC 4.78 4.20 - 5.30 M/uL    HGB 13.8 12.0 - 16.0 g/dL    HCT 37.5 35.0 - 45.0 %    MCV 78.5 74.0 - 97.0 FL    MCH 28.9 24.0 - 34.0 PG    MCHC 36.8 31.0 - 37.0 g/dL    RDW 13.7 11.6 - 14.5 %    PLATELET 169 135 - 420 K/uL    MPV 9.4 9.2 - 11.8 FL    NEUTROPHILS 93 (H) 40 - 73 %    LYMPHOCYTES 5 (L) 21 - 52 %    MONOCYTES 2 (L) 3 - 10 %    EOSINOPHILS 0 0 - 5 %    BASOPHILS 0 0 - 2 %    ABS. NEUTROPHILS 16.2 (H) 1.8 - 8.0 K/UL    ABS. LYMPHOCYTES 0.8 (L) 0.9 - 3.6 K/UL    ABS. MONOCYTES 0.3 0.05 - 1.2 K/UL    ABS. EOSINOPHILS 0.0 0.0 - 0.4 K/UL    ABS. BASOPHILS 0.0 0.0 - 0.06 K/UL    DF AUTOMATED     METABOLIC PANEL, COMPREHENSIVE    Collection Time: 03/17/16  9:55 AM   Result Value Ref Range    Sodium 135 (L) 136 - 145 mmol/L    Potassium 3.9 3.5 - 5.5 mmol/L    Chloride  95 (L) 100 - 108 mmol/L    CO2 30 21 - 32 mmol/L    Anion gap 10 3.0 - 18 mmol/L    Glucose 139 (H) 74 - 99 mg/dL    BUN 47 (H) 7.0 - 18 MG/DL    Creatinine 4.80 (H) 0.6 - 1.3 MG/DL    BUN/Creatinine ratio 10 (L) 12 - 20      GFR est AA 13 (L) >60 ml/min/1.23m    GFR est non-AA 10 (L) >60 ml/min/1.765m   Calcium 8.9 8.5 - 10.1 MG/DL    Bilirubin, total 0.7 0.2 - 1.0 MG/DL    ALT (SGPT) 21 13 - 56 U/L    AST (SGOT) 51 (H) 15 - 37 U/L    Alk. phosphatase 98 45 - 117 U/L    Protein, total 7.9 6.4 - 8.2 g/dL    Albumin 3.5 3.4 - 5.0 g/dL    Globulin 4.4 (H) 2.0 - 4.0 g/dL    A-G Ratio 0.8 0.8 - 1.7         Rad:  none

## 2016-03-17 NOTE — Consults (Signed)
Consult Note    Assessment:   ?? AKI on CKD 4. Etio- htn emergency. There may be element of volume depletion due to n/v. On the other hand component of aki may be minimal and current scr may reflect progression of underlying ckd. No labs between march of this year and now. Only time will tell.   ?? 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   ?? HTN emergency. Likely is precipitated by n/v and inability to keep bp medications down. W/u for secondary causes of htn was neg in the recent past.   ?? N/V/abd pain. Etio? Acute gastroenteritis? Doubt manifestation of uremia.     Recommendations:   ?? Continue gentle ivf.   ?? Change clonidine to every 8 hous. Continue coreg and amlodipine. Continue labetalol IV as needed. Avoid too rapid reduction in bp.   ?? Obtain ua.   ?? Quantify proteinuria.   ?? Obtain urine drug screen.  ?? Obtain pth.   ?? Avoid NSAID's, IV dye.  ?? Avoid Gadolinium due to its association with nephrogenic systemic fibrosis in a patients with severe ARF and ESRD.   ?? Avoid fleets enemas due to concern for acute phosphate nephropathy.   ?? Please dose all medications for approximate creatinine clearance       ?? Avoid PICC lines on either arm in order to preserve veins for dialysis access creation. Patient is at high risk for eventual progression to dialysis.    Thank you.     Consult requested by: Michaelyn Barter, DO    ADMIT DATE: 03/17/2016  CONSULT DATE: March 17, 2016                 Admission diagnosis: severe htn.   Reason for Nephrology Consultation: AKI on CKD.     HPI: Bethany Keller is a 35 y.o. female BLACK OR [place] AMERICAN with long h/o of htn who is known to me from admission to [facility] at the end of February when she was seen for aki in a setting of htn emergency. Patient f/u in the office once in august but never had blood work repeated. She presented to [facility] ED with 3 days h/o of n/v and abd discomfort. She  couldn't keep her bp medications down. She also c/o of HA for which she has been taking Excedrin. In ED bp was 215/165. BP improved with labetalol IV and overall patient is feeling better. Scr was 3.1 at the time of d/c on 07/11/2015. Today scr is up to 4.8.       Past Medical History:   Diagnosis Date   ??? Acute parametritis and pelvic cellulitis 10/09/2011   ??? Chronic kidney disease    ??? Hypertension 10/09/2011      History reviewed. No pertinent surgical history.    Social History     Social History   ??? Marital status: SINGLE     Spouse name: N/A   ??? Number of children: N/A   ??? Years of education: N/A     Occupational History   ??? Not on file.     Social History Main Topics   ??? Smoking status: Never Smoker   ??? Smokeless tobacco: Not on file   ??? Alcohol use Yes      Comment: socially   ??? Drug use: No   ??? Sexual activity: Yes     Partners: Male     Birth control/ protection: Condom  Other Topics Concern   ??? Not on file     Social History Narrative       History reviewed. No pertinent family history.  No Known Allergies     Home Medications:     Prescriptions Prior to Admission   Medication Sig   ??? amLODIPine (NORVASC) 10 mg tablet Take 1 Tab by mouth daily.   ??? hydroCHLOROthiazide (HYDRODIURIL) 25 mg tablet Take 2 Tabs by mouth daily. Do not restart till 07/18/2015   ??? carvedilol (COREG) 25 mg tablet Take 1 Tab by mouth two (2) times daily (with meals).   ??? cloNIDine HCl (CATAPRES) 0.2 mg tablet Take 0.2 mg by mouth two (2) times a day.       Current Inpatient Medications:     Current Facility-Administered Medications   Medication Dose Route Frequency   ??? amLODIPine (NORVASC) tablet 10 mg  10 mg Oral DAILY   ??? labetalol (NORMODYNE;TRANDATE) injection 40 mg  40 mg IntraVENous Q2H PRN   ??? LORazepam (ATIVAN) tablet 1 mg  1 mg Oral ONCE PRN   ??? cloNIDine HCl (CATAPRES) tablet 0.2 mg  0.2 mg Oral BID   ??? carvedilol (COREG) tablet 25 mg  25 mg Oral BID WITH MEALS    ??? 0.9% sodium chloride infusion  75 mL/hr IntraVENous CONTINUOUS   ??? acetaminophen (TYLENOL) solution 650 mg  650 mg Oral Q6H PRN   ??? ondansetron (ZOFRAN ODT) tablet 4 mg  4 mg Oral Q6H PRN   ??? heparin (porcine) injection 5,000 Units  5,000 Units SubCUTAneous Q8H       Review of Systems:   No fever or chills. No sore throat. No cough or hemoptysis. No shortness of breath or chest pain. No orthopnea or paroxysmal nocturnal dyspnea. No melena or hematochezia. No constipation or diarrhea. No dysuria, no gross hematuria of voiding difficulties. No ankle swelling, no joint paints. No muscle aches. No skin changes. No dizziness or lightheadedness.        Physical Assessment:     Vitals:    03/17/16 1345 03/17/16 1400 03/17/16 1415 03/17/16 1506   BP: (!) 235/162 (!) 220/156 (!) 226/145 (!) 210/147   Pulse: 97 98 99 87   Resp: 18 19 21 20    Temp:    98.6 ??F (37 ??C)   SpO2: 99% 99% 97% 95%   Weight:       Height:         Last 3 Recorded Weights in this Encounter    03/17/16 0729   Weight: 72.6 kg (160 lb)     Admission weight: Weight: 72.6 kg (160 lb) (03/17/16 0729)    No intake or output data in the 24 hours ending 03/17/16 1645    Patient is in no apparent distress.   HEENT: Head is normocephalic and atraumatic. Pupils are round, equal, reactive to light. Sclerae are anicteric. Oropharynx clear.   Neck: no cervical lymphadenopathy or thyromegaly.   Lungs: good air entry, clear to auscultation bilaterally. Trachea at the midline. Cardiovascular system: S1, S2, regular rate and rhythm. No murmurs, gallops or rubs. No jvd. Carotid upstroke 2 + bilaterally.   Abdomen: soft, minimal tenderness in the periumbilical area, non distended. No g/r.  Positive bowel sounds. No hepatosplenomegaly. No abdominal bruits.   Extremities: no clubbing, cyanosis. Trace bl le edema. Strong dorsalis pedis pulses. Brisk capillary refill on the toes bilaterally.   Integumentary: skin is grossly intact.    Neurologic: Alert, oriented time three. Cooperative and appropriate.  No gross motor or sensory deficits.       Data Review:    Labs: Results:       Chemistry Recent Labs      03/17/16   0955   GLU  139*   NA  135*   K  3.9   CL  95*   CO2  30   BUN  47*   CREA  4.80*   CA  8.9   AGAP  10   BUCR  10*   AP  98   TP  7.9   ALB  3.5   GLOB  4.4*   AGRAT  0.8         CBC w/Diff Recent Labs      03/17/16   0955   WBC  17.3*   RBC  4.78   HGB  13.8   HCT  37.5   PLT  169   GRANS  93*   LYMPH  5*   EOS  0         Iron/Ferritin No results for input(s): IRON in the last 72 hours.    No lab exists for component: TIBCCALC   PTH/VIT D No results for input(s): PTH in the last 72 hours.    No lab exists for component: VITD           Trish FountainMax Connie Hilgert, M.D  [facility]  Office [phone]  Pager [phone]    March 17, 2016

## 2016-03-17 NOTE — ED Triage Notes (Signed)
Vomiting for 2 days.  BP elevated.

## 2016-03-17 NOTE — Progress Notes (Signed)
Chaplain completed the initial Spiritual Assessment of the patient, and offered Pastoral Care, see flow sheets for interventions. Patient does not have any religious/cultural needs that will affect patient???s preferences in health care. Chaplains will continue to follow and will provide pastoral care on an as needed/requested basis     Chaplain Ernie Etheridge   Board Certified Chaplain   Spiritual Care   [phone]

## 2016-03-18 LAB — EKG 12-LEAD
Atrial Rate: 100 {beats}/min
Diagnosis: NORMAL
P Axis: 63 degrees
P-R Interval: 154 ms
Q-T Interval: 410 ms
QRS Duration: 90 ms
QTc Calculation (Bazett): 528 ms
R Axis: -14 degrees
T Axis: 132 degrees
Ventricular Rate: 100 {beats}/min

## 2016-03-18 LAB — EKG, 12 LEAD, INITIAL
Atrial Rate: 100 {beats}/min
Calculated P Axis: 63 degrees
Calculated R Axis: -14 degrees
Calculated T Axis: 132 degrees
Diagnosis: NORMAL
P-R Interval: 154 ms
Q-T Interval: 410 ms
QRS Duration: 90 ms
QTC Calculation (Bezet): 528 ms
Ventricular Rate: 100 {beats}/min

## 2016-03-18 LAB — URINALYSIS W/MICROSCOPIC
Bilirubin: NEGATIVE
Glucose: NEGATIVE mg/dL
Ketone: NEGATIVE mg/dL
Leukocyte Esterase: NEGATIVE
Nitrites: NEGATIVE
Protein: 300 mg/dL — AB
RBC: NEGATIVE /hpf (ref 0–5)
Specific gravity: 1.017 (ref 1.005–1.030)
Urobilinogen: 0.2 EU/dL (ref 0.2–1.0)
WBC: NEGATIVE /hpf (ref 0–4)
pH (UA): 5.5 (ref 5.0–8.0)

## 2016-03-18 LAB — RENAL FUNCTION PANEL
Albumin: 2.7 g/dL — ABNORMAL LOW (ref 3.4–5.0)
Anion gap: 11 mmol/L (ref 3.0–18)
BUN/Creatinine ratio: 10 — ABNORMAL LOW (ref 12–20)
BUN: 53 MG/DL — ABNORMAL HIGH (ref 7.0–18)
CO2: 30 mmol/L (ref 21–32)
Calcium: 8.2 MG/DL — ABNORMAL LOW (ref 8.5–10.1)
Chloride: 99 mmol/L — ABNORMAL LOW (ref 100–108)
Creatinine: 5.13 MG/DL — ABNORMAL HIGH (ref 0.6–1.3)
GFR est AA: 12 mL/min/{1.73_m2} — ABNORMAL LOW (ref >60)
GFR est non-AA: 10 mL/min/{1.73_m2} — ABNORMAL LOW (ref >60)
Glucose: 93 mg/dL (ref 74–99)
Phosphorus: 6.2 MG/DL — ABNORMAL HIGH (ref 2.5–4.9)
Potassium: 2.7 mmol/L — CL (ref 3.5–5.5)
Sodium: 140 mmol/L (ref 136–145)

## 2016-03-18 LAB — PTH INTACT
Calcium: 8.2 MG/DL — ABNORMAL LOW (ref 8.5–10.1)
PTH, Intact: 268.1 pg/mL — ABNORMAL HIGH (ref 14.0–72.0)

## 2016-03-18 LAB — SODIUM, UR, RANDOM: Sodium,urine random: 11 MMOL/L — ABNORMAL LOW (ref 20–110)

## 2016-03-18 LAB — PROTEIN/CREATININE RATIO, URINE
Creatinine, urine random: 138 mg/dL — ABNORMAL HIGH (ref 30–125)
Protein, urine random: 396 mg/dL — ABNORMAL HIGH (ref <11.9)
Protein/Creat. urine Ratio: 2.9

## 2016-03-18 LAB — HCG URINE, QL: HCG urine, QL: NEGATIVE

## 2016-03-18 LAB — VITAMIN D, 25 HYDROXY: Vitamin D 25-Hydroxy: 12.3 ng/mL — ABNORMAL LOW (ref 30–100)

## 2016-03-18 MED ORDER — CLONIDINE 0.1 MG TAB
0.1 mg | Freq: Three times a day (TID) | ORAL | Status: DC
Start: 2016-03-18 — End: 2016-03-19
  Administered 2016-03-18 – 2016-03-19 (×3): via ORAL

## 2016-03-18 MED ORDER — POTASSIUM CHLORIDE SR 20 MEQ TAB, PARTICLES/CRYSTALS
20 mEq | ORAL | Status: AC
Start: 2016-03-18 — End: 2016-03-18
  Administered 2016-03-18: 15:00:00 via ORAL

## 2016-03-18 MED FILL — CLONIDINE 0.1 MG TAB: 0.1 mg | ORAL | Qty: 1

## 2016-03-18 MED FILL — CLONIDINE 0.1 MG TAB: 0.1 mg | ORAL | Qty: 2

## 2016-03-18 MED FILL — CARVEDILOL 25 MG TAB: 25 mg | ORAL | Qty: 1

## 2016-03-18 MED FILL — HEPARIN (PORCINE) 5,000 UNIT/ML IJ SOLN: 5000 unit/mL | INTRAMUSCULAR | Qty: 1

## 2016-03-18 MED FILL — ACETAMINOPHEN 650 MG/20.3 ML ORAL SOLN: 650 mg/20.3 mL | ORAL | Qty: 20.3

## 2016-03-18 MED FILL — POTASSIUM CHLORIDE SR 20 MEQ TAB, PARTICLES/CRYSTALS: 20 mEq | ORAL | Qty: 2

## 2016-03-18 MED FILL — AMLODIPINE 10 MG TAB: 10 mg | ORAL | Qty: 1

## 2016-03-18 NOTE — ACP (Advance Care Planning) (Signed)
Patient has designated ____her friend____________________ to participate in his/her discharge plan and to receive any needed information.     Name:  Hermelinda Medicusd Young  Address:  Phone number:  [phone]

## 2016-03-18 NOTE — Progress Notes (Signed)
Pt. Blood pressure is 156/99 notified nurse Reeanna

## 2016-03-18 NOTE — Other (Addendum)
1930: Assumed care. Behavior appropriate to situations. Assessment done. Denies any pain or discomfort at this time. Call light within reach. Urine collected & sent to lab per order.    2125: No change from previous assessment.    2211: Due meds given.    2307: No change form previous assessment.    0100: Sleeping.    0403: No change from previous assessment.     [id number]: Due med given.    [id number] Nixon: Janice from lab called. Patient's K+ level this morning is 2.7. Paged Dr. Romie Jumperhukwuma .     0645: Slept good thru night. Needs attended.    [id number]: Bedside and Verbal shift change report given to Clelia, RN (oncoming nurse) by me (offgoing nurse). Report included the following information SBAR, Kardex, Intake/Output, MAR and Recent Results.

## 2016-03-18 NOTE — Other (Signed)
Bedside, Verbal and Written shift change report given to Nicholes Stairsiffany Sessom, RN (oncoming nurse) by Rush FarmerAlelie Veranga, RN (offgoing nurse). Report included the following information SBAR, Kardex, Intake/Output, MAR, Recent Results, Med Rec Status, Procedure Summary and Cardiac Rhythm NSR.  ????  0740 - Shift assessment completed. Pt alert and oriented x4. No respiratory distress noted. No c/o pain reported. Call bell within reach, bed in low position. Will continue to monitor.  ????  [phone] - Paged Dr Janee Mornhompson, awaiting return call back. Pt's Potassium lab 2.7 this morning.    [phone] - Dr Janee Mornhompson returned call, notified pt's Potassium lab 2.7 this morning. Stated he would place orders.    1240 - Shift re-assessment completed, no change in pt condition.  ????  1640 - Shift re-assessment completed, no change in pt condition.  ????  Bedside, Verbal and Written shift change report given to Paulita CradleIrene Afriyie, RN (oncoming nurse) by Nicholes Stairsiffany Sessom, RN (offgoing nurse). Report included the following information SBAR, Kardex, Intake/Output, MAR, Recent Results, Med Rec Status, Procedure Summary and Cardiac Rhythm NSR.

## 2016-03-18 NOTE — Med Student Progress Note (Signed)
*ATTENTION:  This note has been created by a medical student for educational purposes only.  Please do not refer to the content of this note for clinical decision-making, billing, or other purposes.  Please see attending physician???s note to obtain clinical information on this patient.*       *ATTENTION:  This note has been created by a medical student for educational purposes only.  Please do not refer to the content of this note for clinical decision-making, billing, or other purposes.  Please see attending physician???s note to obtain clinical information on this patient.*       Student Progress Note  Please refer to attendings daily rounding note for full details      Patient: Bethany Keller MRN: [id number]  CSN: [id number]    Date of Birth: 1980/09/21  Age: 35 y.o.  Sex: female    DOA: 03/17/2016 LOS:  LOS: 1 day          Chief Complaint:  Nausea/Vomiting    Subjective:   HPI: Bethany Keller presented to ED after 3 days intractable progressively worsening N/V with green emesis and overall feeling unwell.  HTN emergency on arrival, has since gone down after adequate treatment.  Admitted for kidney function which Scr has risen from 3.1 in March/2017 where she received CKD stage III diagnosis to 4.8 yesterday.  Has since risen to 5.13 this AM.  Says she checks BP infrequently at home where SBP runs in 160's.  ? compliance to medication, though significant other and pt claim she is very good at remembering medications.  She also was unable to keep oral meds down since N/V onset.  Claims of frequent headaches treated symptomatically with Excedrin but on arrival denies HA or blurred vision.     Today feels overall better without N/V.  Abdominal pain is almost resolved, still has minor tenderness to upper periumbilical region without radiation.  Says she prefers clonidine because it works, but her PCP's keep changing it to other medications.  Has not taken it for months.    PMH:   Past Medical History:   Diagnosis Date    ??? Acute parametritis and pelvic cellulitis 10/09/2011   ??? Chronic kidney disease    ??? Hypertension 10/09/2011       Allergies: No Known Allergies    Meds:   Current Facility-Administered Medications:   ???  cloNIDine HCl (CATAPRES) tablet 0.1 mg, 0.1 mg, Oral, Q8H, Dortha Kern, MD  ???  amLODIPine (NORVASC) tablet 10 mg, 10 mg, Oral, DAILY, Darren A Willis, PA, 10 mg at 03/18/16 1020  ???  labetalol (NORMODYNE;TRANDATE) injection 40 mg, 40 mg, IntraVENous, Q2H PRN, Anderson Malta, DO, 40 mg at 03/17/16 1656  ???  LORazepam (ATIVAN) tablet 1 mg, 1 mg, Oral, ONCE PRN, Anderson Malta, DO  ???  carvedilol (COREG) tablet 25 mg, 25 mg, Oral, BID WITH MEALS, Anderson Malta, DO, 25 mg at 03/18/16 1020  ???  0.9% sodium chloride infusion, 75 mL/hr, IntraVENous, CONTINUOUS, Anderson Malta, DO, Last Rate: 75 mL/hr at 03/18/16 0700, 75 mL/hr at 03/18/16 0700  ???  acetaminophen (TYLENOL) solution 650 mg, 650 mg, Oral, Q6H PRN, Anderson Malta, DO  ???  ondansetron (ZOFRAN ODT) tablet 4 mg, 4 mg, Oral, Q6H PRN, Anderson Malta, DO  ???  heparin (porcine) injection 5,000 Units, 5,000 Units, SubCUTAneous, Q8H, Anderson Malta, DO, 5,000 Units at 03/18/16 1021    Social Hx:   Social History     Social  History   ??? Marital status: SINGLE     Spouse name: N/A   ??? Number of children: N/A   ??? Years of education: N/A     Occupational History   ??? Not on file.     Social History Main Topics   ??? Smoking status: Never Smoker   ??? Smokeless tobacco: Not on file   ??? Alcohol use Yes      Comment: socially   ??? Drug use: No   ??? Sexual activity: Yes     Partners: Male     Birth control/ protection: Condom     Other Topics Concern   ??? Not on file     Social History Narrative     ROS:  Constitutional: Denies weight loss, fatigue, fever, chills, night sweats.  Skin: Denies rash, pruritis, changes in hair/nails.  HEENT: Denies headache, vision changes, tinnitus, hearing loss  Respiratory: Denies cough, SOB, wheezing.   Cardiovascular: Denies chest pain, edema.  Gastrointestinal: Denies abdomen pain, N/V/D, dark or bloody stool.  Genitourinary: Denies dysuria, frequency, hematuria.  Musculoskeletal: Denies arthralgia, swelling, myalgia.  Neurologic: Denies dizziness, weakness, seizures, paresthesia, tremor, syncope.  Hematologic: Denies easy bruising/bleeding.  Endocrine: Denies polyuria, polydipsia, polyphagia, heat/cold intolerance      Objective:      Visit Vitals   ??? BP (!) 156/99 (BP 1 Location: Right arm)   ??? Pulse 77   ??? Temp 98 ??F (36.7 ??C)   ??? Resp 18   ??? Ht 5' 5"  (1.651 m)   ??? Wt 72.6 kg (160 lb)   ??? SpO2 94%   ??? BMI 26.63 kg/m2         Physical Exam:  General: Well appearing, well nourished.  Alert and cooperative, in NAD.  Appropriate mood and affect.  Skin: Warm, dry, and intact without lesions, erythema, bruising, or pallor.  HEENT: Normocephalic, atraumatic.  Vision grossly intact.  Hearing grossly intact.  Neck supple,  Trachea midline.  Cardiovascular: RRR. No peripheral edema, cyanosis, or pallor.  Radial and dorsalis pedis pulses strong and equal bilaterally. Capillary refill <2 seconds.    Respiratory: CTAB without wheezes.  Abdominal: Soft.  Mildly tender upper periumbilical region. Normoactive bowel sounds.  No guarding or rebound tenderness.  No CVA tenderness.  Musculoskeletal: Normal muscular development.  ROM spine and extremities grossly intact.      I have reviewed the patient's Labs and Radiology studies.      Assessment/Plan:   Linking Statement:    1) HTN emergency   - Continue PO home meds and add Labetolol when needed    2) AKI on Chronic Kidney Disease   - nephrology consulted and following   - eGFR is 12 leading to stage V CKD, question legitimacy of this value during onset of AKI if this rise in Scr is indeed AKI.  Will monitor.   - In setting of uremic symptoms may start dialysis if Scr/eGFR remains these values    Junie Panning  03/18/2016  10:23 AM

## 2016-03-18 NOTE — Progress Notes (Signed)
Problem: Falls - Risk of  Goal: *Absence of Falls  Document Schmid Fall Risk and appropriate interventions in the flowsheet.   Outcome: Progressing Towards Goal  Fall Risk Interventions:  Mobility Interventions: Patient to call before getting OOB         Medication Interventions: Patient to call before getting OOB

## 2016-03-18 NOTE — Progress Notes (Signed)
Pt. Blood pressure is 135/92 notified nurse notified nurse Shelly

## 2016-03-18 NOTE — Progress Notes (Signed)
Daily Progress Note: 03/18/2016 3:06 PM   Admit Date: 03/17/2016    Patient seen in follow up for multiple medical problems as listed below:  Patient Active Problem List   Diagnosis Code   ??? Acute parametritis and pelvic cellulitis N73.0   ??? Hypertension I10   ??? Acute renal failure (ARF) (HCC) N17.9   ??? Hypertensive emergency I16.1   ??? Headache R51   ??? Malignant hypertension I10   ??? CKD (chronic kidney disease) stage 3, GFR 30-59 ml/min N18.3   ??? ESRD (end stage renal disease) (HCC) N18.6       Assesment     Malignant hypertension/Hypertensive crisis:   -restarted home meds. With nephrology adjusting. BP normalized by 11/7  -last TTE EF50-55% LVH, grade2dd  -will need better BP control long term.   ??  AKI on CKD -progression to CKD5  -Cr increased from low-mid 3's to 4.8, then 5.1  -NS @75 /hr  -Urine studies. Spot prot/cr=2.9, Renal US 11/6  -Renal consulting  -2' PTH at 268 needs Active Vit D  ??  N/V and abdominal pain  -Etiology unclear, improving with supportive care and anti-emetics  ??  Headaches  -On tylenol prn    DVT Protocol Active: yes  Code Status:  Full Code     Disposition:    Subjective:     CC: Vomiting    Interval History: BP normalized. Cr increased unfortunately. K+ low. She did urinate last night.     ROS: 11 point ROS negative    Objective:     Visit Vitals   ??? BP (!) 135/92 (BP 1 Location: Right arm)   ??? Pulse 75   ??? Temp 98 ??F (36.7 ??C)   ??? Resp 20   ??? Ht 5\' 5"  (1.651 m)   ??? Wt 72.6 kg (160 lb)   ??? SpO2 97%   ??? BMI 26.63 kg/m2       Temp (24hrs), Avg:98.2 ??F (36.8 ??C), Min:98 ??F (36.7 ??C), Max:98.9 ??F (37.2 ??C)        Intake/Output Summary (Last 24 hours) at 03/18/16 1506  Last data filed at 03/18/16 0659   Gross per 24 hour   Intake             1740 ml   Output                0 ml   Net             1740 ml       Gen: AOx3, NAD  HEENT:  PERL, EOMI.   Neck: No Bruits/JVD   Lungs:   CTAB. Good respiratory effort  Heart:   RR S1 S2 without M/R/G  Abdomen: ND,NT, BSX4,    Extremities:   No LE edema. No cyanosis.  Skin:  no jaundice/lesions      Data Review:     Meds/Labs/Tests reviewed    Current Shift:     Last three shifts:  11/05 1901 - 11/07 0700  In: 1740 [P.O.:675; I.V.:1065]  Out: -   Recent Labs      03/17/16   0955   WBC  17.3*   RBC  4.78   HGB  13.8   HCT  37.5   PLT  169   GRANS  93*   LYMPH  5*   EOS  0       Recent Labs      03/18/16   0350  03/17/16   0955  BUN  53*  47*   CREA  5.13*  4.80*   CA  8.2*   8.2*  8.9   ALB  2.7*  3.5   K  2.7*  3.9   NA  140  135*   CL  99*  95*   CO2  30  30   PHOS  6.2*   --    GLU  93  139*        Lab Results   Component Value Date/Time    Glucose 93 03/18/2016 03:50 AM    Glucose 139 03/17/2016 09:55 AM    Glucose 86 07/11/2015 04:52 AM    Glucose 111 07/09/2015 02:15 AM    Glucose 137 07/08/2015 05:21 AM          Care coordination with Nursing/Consultants/staff: 15  Prior history, labs, and charting reviewed: 15    Procedures/Imaging:  Renal US    Total time spent with chart review, patient examination/education, discussion with staff on case,documentation and medication management / adjustment  :  30 Minutes      Dr Malka SoJames A Takota Cahalan DO  [facility]  Hospitalist Division  [phone]

## 2016-03-18 NOTE — ED Provider Notes (Signed)
HPI Comments: Bethany Keller is a 35 y.o. female with hx of kidney disease that presents to the ED with a complaint of vomiting and flank pain x3 days.  Pt states that she sx started about 3 days ago when she began vomiting but vomiting has worsened until she her episodes of emesis were every 15-20 minutes yesterday.  Pt states that she has not been bale to keep down any food or drink over the past 2 days.  Pt states that she had similar sx back in February when she was diagnosed with kidney disease.  She is being followed by Dr Berton Lafayette but her next appt is not until 11.21.  PT has hx of HTN and is still uncontrolled on 4 medications.  NO other complaints at this time.    Patient is a 35 y.o. female presenting with vomiting.   Vomiting    Pertinent negatives include no fever and no cough.        Past Medical History:   Diagnosis Date   ??? Acute parametritis and pelvic cellulitis 10/09/2011   ??? Chronic kidney disease    ??? Hypertension 10/09/2011       History reviewed. No pertinent surgical history.      History reviewed. No pertinent family history.    Social History     Social History   ??? Marital status: SINGLE     Spouse name: N/A   ??? Number of children: N/A   ??? Years of education: N/A     Occupational History   ??? Not on file.     Social History Main Topics   ??? Smoking status: Never Smoker   ??? Smokeless tobacco: Not on file   ??? Alcohol use Yes      Comment: socially   ??? Drug use: No   ??? Sexual activity: Yes     Partners: Male     Birth control/ protection: Condom     Other Topics Concern   ??? Not on file     Social History Narrative         ALLERGIES: Review of patient's allergies indicates no known allergies.    Review of Systems   Constitutional: Negative for fatigue and fever.   Respiratory: Negative for cough, shortness of breath and wheezing.    Cardiovascular: Negative for chest pain and leg swelling.   Gastrointestinal: Positive for vomiting.   Genitourinary: Positive for flank pain.    Musculoskeletal: Negative for back pain.   All other systems reviewed and are negative.      Vitals:    03/17/16 1854 03/17/16 2125 03/17/16 2347 03/18/16 0403   BP: 105/69 133/85 136/89 135/87   Pulse:  84 91 90   Resp:  20 18 18    Temp:  98.9 ??F (37.2 ??C) 98.1 ??F (36.7 ??C) 98.2 ??F (36.8 ??C)   SpO2:  97% 97% 100%   Weight:       Height:                Physical Exam   Constitutional: She is oriented to person, place, and time. She appears well-developed and well-nourished.   HENT:   Head: Normocephalic and atraumatic.   Eyes: Conjunctivae are normal. Pupils are equal, round, and reactive to light.   Neck: Normal range of motion.   Cardiovascular: Normal rate and regular rhythm.    Pulmonary/Chest: Effort normal and breath sounds normal. No respiratory distress. She has no rales.   Abdominal: Normal appearance and bowel sounds  are normal. There is tenderness in the suprapubic area, left upper quadrant and left lower quadrant.   Musculoskeletal: Normal range of motion.   Neurological: She is alert and oriented to person, place, and time.   Skin: Skin is warm.   Psychiatric: She has a normal mood and affect. Her behavior is normal.   Vitals reviewed.       MDM  Number of Diagnoses or Management Options  Acute renal failure superimposed on chronic kidney disease, unspecified CKD stage, unspecified acute renal failure type [facility](HCC):   Diagnosis management comments: Labs Reviewed  CBC WITH AUTOMATED DIFF - Abnormal; Notable for the following:      WBC                           17.3 (*)               NEUTROPHILS                   93 (*)                 LYMPHOCYTES                   5 (*)                  MONOCYTES                     2 (*)                  ABS. NEUTROPHILS              16.2 (*)               ABS. LYMPHOCYTES              0.8 (*)             All other components within normal limits  METABOLIC PANEL, COMPREHENSIVE - Abnormal; Notable for the following:      Sodium                        135 (*)                 Chloride                      95 (*)                 Glucose                       139 (*)                BUN                           47 (*)                 Creatinine                    4.80 (*)               BUN/Creatinine ratio          10 (*)                 GFR est AA  13 (*)                 GFR est non-AA                10 (*)                 AST (SGOT)                    51 (*)                 Globulin                      4.4 (*)             All other components within normal limits  PROTEIN/CREATININE RATIO, URINE - Abnormal; Notable for the following:      Protein, urine random         396 (*)                Creatinine, urine             138.00 (*)            All other components within normal limits  SODIUM, UR, RANDOM - Abnormal; Notable for the following:      Sodium urine, random          11 (*)              All other components within normal limits  URINALYSIS W/MICROSCOPIC - Abnormal; Notable for the following:      Protein                       300 (*)                Blood                         SMALL (*)            All other components within normal limits  RENAL FUNCTION PANEL - Abnormal; Notable for the following:      Potassium                     2.7 (*)                Chloride                      99 (*)                 BUN                           53 (*)                 Creatinine                    5.13 (*)               BUN/Creatinine ratio          10 (*)                 GFR est AA                    12 (*)  GFR est non-AA                10 (*)                 Calcium                       8.2 (*)                Phosphorus                    6.2 (*)                Albumin                       2.7 (*)             All other components within normal limits  HCG URINE, QL  PTH INTACT  VITAMIN D, 25 HYDROXY  CATECHOLAMINES, PLASMA  METANEPHRINES, PLASMA  9 - DRUG SCREEN, W/ CONFIRM  11-DRUG SCREEN, URINE  METABOLIC PANEL, BASIC            Amount and/or Complexity of Data Reviewed  Clinical lab tests: ordered and reviewed    Risk of Complications, Morbidity, and/or Mortality  Presenting problems: moderate  Diagnostic procedures: moderate  Management options: moderate    Patient Progress  Patient progress: stable    ED Course       Procedures

## 2016-03-18 NOTE — Progress Notes (Signed)
[facility]   Discharge Planning/Social Services Assessment    Reasons for Intervention: Chart reviewed.  Met with pt., verified all demographics.  Correct address is : [address] [place] [phone], email sent to [facility]. [place] in registration.  States has BC ins, does not have card with her.  States does not have PCP, was going to Dr. Kingsley Plan & no longer goes there.  PCP set up by life coach with Dr. Murlean Hark.  NOK: Marry Guan, mother: [phone].  Lives alone.  Uses no DME.  Independent with ADL's prior to admit.  PLAN: home when medically stable.  Will cont to follow for any needs.  Pat Rizzo,RN,ext. [phone].     High Risk Criteria   Yes  No   Physician Referral   Yes  No        Date    Nursing Referral   Yes  No        Date    Patient/Family Request   Yes  No        Date       Resources:    Medicare   Yes  No   Medicaid   Yes  No   No Resources   Yes  No   Private Insurance   Yes  No   Case Manager Name/Phone Number    Other   Yes  No        (i.e. Workman's Comp)         Prior Services:    Prior Services   Yes  No   Home Health   Yes  No   [place]   Yes  No        Number of Hours    Home Care Program   Yes  No   Case Manager    Meals on Wheels   Yes  No   Office on Aging   Yes  No   Transportation Services   Yes  No   Nursing Home   Yes  No        Nursing Home Name    [facility]   Yes  No        [place] Name    Other       Information Source:      Information obtained from   Patient   Parent    Guardian   Child   Spouse    Significant Other/Partner    Friend       EMS     Nursing Home Chart           Other:   Chart Review   Yes  No     Family/Support System:    Patient lives with   Alone     Spouse    Significant Other   Children   Caretaker    Parent   Sibling      Other       Other Support System:    Is the patient responsible for care of others   Yes  No   Information of person caring for patient on  discharge    Managers financial affairs independently   Yes  No    If no, explain:      Status Prior to Admission:    Mental Status   Awake   Alert   Oriented   Quiet/Calm  Lethargic/Sedated    Disoriented   Restless/Anxious  Combative   Personal Care   Dependent   Independent Personal Care   Requires Assistance   Meal Preparation Ability   Independent    Standby Assistance    Minimal Assistance    Moderate Assistance   Maximum Assistance      Total Assistance   Chores   Independent with [place] Resident    Requires Assistance   Bowel/Bladder   Continent   Catheter   Incontinent   Ostomy Self-Care     Urine Diversion Self-Care   Maximum Assistance      Total Assistance   Number of Persons needed for assistance    DME at home   [place], [place], Straight    Commode     Bathroom/[facility] Bed   Nebulizer   Oxygen            Raised Toilet Seat   Shower Chair   Side Rails for Bed    Tub Transfer Bench    Walker, Building surveyor, Standard       Other:   Vendor      Treatment Presently Receiving:    Current Treatments   Chemotherapy   Dialysis   Insulin   IVAB  IVF    O2   PCA    PT    RT    Tube Feedings    Wound Care     Psychosocial Evaluation:    Verbalized Knowledge of Disease Process   Patient  Family   Coping with Disease Process   Patient  Family   Requires Further Counseling Coping with Disease Process   Patient  Family     Identified Projected Needs:    Home Health Aid   Yes  No   Transportation   Yes  No   Education   Yes  No        Specific Education     Financial Counseling   Yes  No   Inability to Care for Self/Will Require 24 hour care   Yes  No   Pain Management   Yes  No   Home Infusion Therapy   Yes  No   Oxygen Therapy   Yes  No   DME   Yes  No   Long Term Care Placement   Yes  No   Rehab   Yes  No   Physical Therapy   Yes  No   Needs Anticipated At This Time   Yes  No     Intra-Hospital Referral:    [place]   Yes  No   Life Coach   Yes  No   Patient Representative   Yes  No   Staff for Teaching Needs   Yes  No    Specialty Teaching Needs     Diabetic Educator   Yes  No   Referral for Diabetic Educator Needed   Yes  No  If Yes, place order for Nutritionist or Diabetic Consult     Tentative Discharge Plan:    Home with No Services   Yes  No   Home with Home Health Follow-up   Yes  No        If Yes, specify type    Home Care Program   Yes  No        If Yes, specify type    Meals on Wheels   Yes  No  Office of Aging   Yes  No   NHP   Yes  No   Return to the Nursing Home   Yes  No   Rehab Therapy   Yes  No   Acute Rehab   Yes  No   Subacute Rehab   Yes  No   Private Care   Yes  No   Substance Abuse Referral   Yes  No   Transportation   Yes  No   Chore Service   Yes  No   Inpatient Hospice   Yes  No   OP RT   Yes   No   OP Hemo   Yes   No   OP PT   Yes  No   Support Group   Yes  No   Reach to Recovery   Yes  No   OP Oncology Clinic   Yes  No   Clinic Appointment   Yes  No   DME   Yes  No   Comments    Name of D/C Planner or Social Worker Given to Patient or Family Libby Maw   Phone Number Pager: [phone]        Extension Ext [phone]   Date 03-18-2016   Time    If you are discharged home, whom do you designate to participate in your discharge plan and receive any information needed?     Enter name of Elyn Peers        Phone # of designee [phone]        Address of designee         Updated         Patient refused to designate any           individual

## 2016-03-18 NOTE — Progress Notes (Addendum)
2018 Patient wants to take  a walk downstairs with significant other,tele notified,patient is sturdy on her feet a/o x4,no signs of distress noted    2110 patient back on floor,periopheral iv ,infilterated patient does not want to be stuck      0130 patient's potassium 2.7 since 0350 yesterday Dr Romie Jumperhukwuma notified tab potassium ordered 40 meq bid ordered,    0149 tab potassium given with due med of heparin sub      0651 patient complaining of pain in the abdomen rates pain 5/10 tylenol offered ,patient declined wants her own excedrin,patient advised it is not allowed ,still insisted on it     0725 shift report given to Devon Energyiffany Sessoms Rn

## 2016-03-18 NOTE — Progress Notes (Addendum)
RENAL PROGRESS NOTE        Bethany Rogueiffany Keller         Assessment/Plan:   ?? AKI in a setting of  htn emergency/volume depletion due to n/v. Scr is up slightly since yesterday, likely due to improved bp control. Component of aki in general may be minimal and current scr may reflect progression of underlying ckd. No labs between march of this year and now. Only time will tell.   ?? 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 Renal us is with increased echogenicity. At this point unlikely to benefit from kidney biopsy due to being beyond the point of no return in terms of ckd progression.   ?? Hypokalemia. Agree with replacement.  ?? HTN emergency. Likely is precipitated by n/v and inability to keep bp medications down. Better controlled. Reduce clonidine, avoid too rapid bp control.   ?? N/V/abd pain. Etio? Acute gastroenteritis? Improving. Doubt manifestation of uremia.  ?? Secondary hyperparathyroidism/hyperphosphatemia. Monitor at this time.                                                                                                                               Subjective:  Patient complaints off: Feels better. No SOB/CP/N/V. No HA. Appetite is a little better.       Patient Active Problem List   Diagnosis Code   ??? Acute parametritis and pelvic cellulitis N73.0   ??? Hypertension I10   ??? Acute renal failure (ARF) (HCC) N17.9   ??? Hypertensive emergency I16.1   ??? Headache R51   ??? Malignant hypertension I10   ??? CKD (chronic kidney disease) stage 3, GFR 30-59 ml/min N18.3   ??? ESRD (end stage renal disease) (HCC) N18.6       Current Facility-Administered Medications   Medication Dose Route Frequency Provider Last Rate Last Dose   ??? potassium chloride (K-DUR, KLOR-CON) SR tablet 40 mEq  40 mEq Oral NOW Michaelyn BarterJames Andrew Thompson, DO       ??? cloNIDine HCl (CATAPRES) tablet 0.1 mg  0.1 mg Oral Q8H Dicie BeamMaxim Reilley Latorre V, MD        ??? amLODIPine (NORVASC) tablet 10 mg  10 mg Oral DAILY Darren Anselm PancoastA Willis, PA   10 mg at 03/17/16 1310   ??? labetalol (NORMODYNE;TRANDATE) injection 40 mg  40 mg IntraVENous Q2H PRN Michaelyn BarterJames Andrew Thompson, DO   40 mg at 03/17/16 1656   ??? LORazepam (ATIVAN) tablet 1 mg  1 mg Oral ONCE PRN Michaelyn BarterJames Andrew Thompson, DO       ??? carvedilol (COREG) tablet 25 mg  25 mg Oral BID WITH MEALS Michaelyn BarterJames Andrew Thompson, DO   25 mg at 03/17/16 1646   ??? 0.9% sodium chloride infusion  75 mL/hr IntraVENous CONTINUOUS Michaelyn BarterJames Andrew Thompson, DO 75 mL/hr at 03/18/16 0513 75 mL/hr at 03/18/16 0513   ??? acetaminophen (TYLENOL) solution 650 mg  650  mg Oral Q6H PRN Michaelyn BarterJames Andrew Thompson, DO       ??? ondansetron (ZOFRAN ODT) tablet 4 mg  4 mg Oral Q6H PRN Michaelyn BarterJames Andrew Thompson, DO       ??? heparin (porcine) injection 5,000 Units  5,000 Units SubCUTAneous Q8H Michaelyn BarterJames Andrew Thompson, DO   5,000 Units at 03/17/16 1706       Objective  Vitals:    03/17/16 2125 03/17/16 2347 03/18/16 0403 03/18/16 0806   BP: 133/85 136/89 135/87 (!) 156/99   Pulse: 84 91 90 77   Resp: 20 18 18 18    Temp: 98.9 ??F (37.2 ??C) 98.1 ??F (36.7 ??C) 98.2 ??F (36.8 ??C) 98 ??F (36.7 ??C)   SpO2: 97% 97% 100% 94%   Weight:       Height:             Intake/Output Summary (Last 24 hours) at 03/18/16 1000  Last data filed at 03/18/16 0659   Gross per 24 hour   Intake             1740 ml   Output                0 ml   Net             1740 ml           Admission weight: Weight: 72.6 kg (160 lb) (03/17/16 0729)  Last Weight Metrics:  Weight Loss Metrics 03/17/2016 07/11/2015 08/05/2012 10/07/2011   Today's Wt 160 lb 154 lb 9.6 oz 180 lb 180 lb   BMI 26.63 kg/m2 25.73 kg/m2 29.95 kg/m2 29.95 kg/m2             Physical Assessment:     General: NAD, alert and oriented.  Neck: No jvd.  LUNGS: Clear to Auscultation, No rales, rhonchi or wheezes.  CVS EXM: S1, S2  RRR, no murmurs/gallops/rubs.  Abdomen: soft, non tender.   Lower Extremities:  no edema.       Lab    CBC w/Diff Recent Labs      03/17/16   0955    WBC  17.3*   RBC  4.78   HGB  13.8   HCT  37.5   PLT  169   GRANS  93*   LYMPH  5*   EOS  0        Chemistry Recent Labs      03/18/16   0350  03/17/16   0955   GLU  93  139*   NA  140  135*   K  2.7*  3.9   CL  99*  95*   CO2  30  30   BUN  53*  47*   CREA  5.13*  4.80*   CA  8.2*   8.2*  8.9   AGAP  11  10   BUCR  10*  10*   AP   --   98   TP   --   7.9   ALB  2.7*  3.5   GLOB   --   4.4*   AGRAT   --   0.8   PHOS  6.2*   --          No results found for: IRON, FE, TIBC, IBCT, PSAT, FERR Lab Results   Component Value Date/Time    Calcium 8.2 03/18/2016 03:50 AM    Calcium 8.2 03/18/2016 03:50 AM    Phosphorus 6.2 03/18/2016 03:50 AM  Isabel Caprice, M.D.  [facility]  Phone [phone]  Pager [phone]

## 2016-03-18 NOTE — ACP (Advance Care Planning) (Signed)
Patient has designated ____her friend____________________ to participate in his/her discharge plan and to receive any needed information.     Name:  Bethany Keller  Address:  Phone number:  [phone]

## 2016-03-19 LAB — RENAL FUNCTION PANEL
Albumin: 2.7 g/dL — ABNORMAL LOW (ref 3.4–5.0)
Anion gap: 7 mmol/L (ref 3.0–18)
BUN/Creatinine ratio: 11 — ABNORMAL LOW (ref 12–20)
BUN: 55 MG/DL — ABNORMAL HIGH (ref 7.0–18)
CO2: 29 mmol/L (ref 21–32)
Calcium: 8 MG/DL — ABNORMAL LOW (ref 8.5–10.1)
Chloride: 102 mmol/L (ref 100–108)
Creatinine: 5.16 MG/DL — ABNORMAL HIGH (ref 0.6–1.3)
GFR est AA: 12 mL/min/{1.73_m2} — ABNORMAL LOW (ref >60)
GFR est non-AA: 10 mL/min/{1.73_m2} — ABNORMAL LOW (ref >60)
Glucose: 118 mg/dL — ABNORMAL HIGH (ref 74–99)
Phosphorus: 2.9 MG/DL (ref 2.5–4.9)
Potassium: 3.6 mmol/L (ref 3.5–5.5)
Sodium: 138 mmol/L (ref 136–145)

## 2016-03-19 LAB — 11-DRUG SCREEN, URINE
Amphetamine screen: NEGATIVE ng/mL
Barbiturates screen: NEGATIVE ng/mL
Benzodiazepines screen: NEGATIVE ng/mL
Buprenorphine, Urine: NEGATIVE ng/mL
Cannabinoids screen: NEGATIVE ng/mL
Cocaine Metab. screen: NEGATIVE ng/mL
Creatinine, urine: 129.6 mg/dL (ref 20.0–300.0)
Methadone screen: NEGATIVE ng/mL
Opiates screen: POSITIVE ng/mL
Oxycodone/Oxymorphone screen: NEGATIVE ng/mL
Phencyclidine screen: NEGATIVE ng/mL
Propoxyphene screen: NEGATIVE ng/mL
pH, urine: 5.4 (ref 4.5–8.9)

## 2016-03-19 MED ORDER — AMLODIPINE 10 MG TAB
10 mg | ORAL_TABLET | Freq: Every day | ORAL | 3 refills | Status: AC
Start: 2016-03-19 — End: ?

## 2016-03-19 MED ORDER — CLONIDINE 0.2 MG TAB
0.2 mg | ORAL_TABLET | Freq: Two times a day (BID) | ORAL | 1 refills | Status: AC
Start: 2016-03-19 — End: ?

## 2016-03-19 MED ORDER — POTASSIUM CHLORIDE SR 10 MEQ TAB
10 mEq | Freq: Two times a day (BID) | ORAL | Status: DC
Start: 2016-03-19 — End: 2016-03-19
  Administered 2016-03-19: 07:00:00 via ORAL

## 2016-03-19 MED ORDER — CARVEDILOL 25 MG TAB
25 mg | ORAL_TABLET | Freq: Two times a day (BID) | ORAL | 3 refills | Status: AC
Start: 2016-03-19 — End: ?

## 2016-03-19 MED ORDER — CALCITRIOL 0.25 MCG CAP
0.25 mcg | ORAL_CAPSULE | Freq: Every day | ORAL | 1 refills | Status: DC
Start: 2016-03-19 — End: 2017-01-19

## 2016-03-19 MED FILL — CLONIDINE 0.1 MG TAB: 0.1 mg | ORAL | Qty: 1

## 2016-03-19 MED FILL — HEPARIN (PORCINE) 5,000 UNIT/ML IJ SOLN: 5000 unit/mL | INTRAMUSCULAR | Qty: 1

## 2016-03-19 MED FILL — KLOR-CON 10 MEQ TABLET,EXTENDED RELEASE: 10 mEq | ORAL | Qty: 4

## 2016-03-19 MED FILL — CARVEDILOL 25 MG TAB: 25 mg | ORAL | Qty: 1

## 2016-03-19 MED FILL — AMLODIPINE 10 MG TAB: 10 mg | ORAL | Qty: 1

## 2016-03-19 MED FILL — LORAZEPAM 1 MG TAB: 1 mg | ORAL | Qty: 1

## 2016-03-19 NOTE — Discharge Summary (Signed)
Discharge Summary by Michaelyn Barterhompson, Jamela Cumbo Andrew, DO at 03/19/16 1023                Author: Michaelyn Barterhompson, Troi Florendo Andrew, DO  Service: Internal Medicine  Author Type: Physician       Filed: 03/19/16 1631  Date of Service: 03/19/16 1023  Status: Signed          Editor: Michaelyn Barterhompson, Brinna Divelbiss Andrew, DO (Physician)                    [facility]   Hospitalist Division      Discharge Summary             Patient: Bethany Keller  MRN: [id number]   CSN: [id number]          Date of Birth: 10/10/1980   Age: 35 y.o.   Sex: female      DOA: 03/17/2016  LOS:  LOS: 2 days    Discharge Date: 03/19/16        PCP:  Jenene SlickerPravin M Deshmukh, MD      Chief Complaint:       Chief Complaint       Patient presents with        ?  Vomiting        Admission Diagnosis:       Hospital Problems as of 03/19/2016   Date Reviewed:  10/08/2011                Codes  Class  Noted - Resolved  POA              ESRD (end stage renal disease) (HCC)  ICD-10-CM: N18.6   ICD-9-CM: 585.6    03/17/2016 - Present  Unknown                          Discharge Diagnoses:     Malignant hypertension/Hypertensive crisis:    -restarted home meds. With nephrology adjusting. BP normalized by 11/7    -last TTE EF50-55% LVH, grade2dd   -will need better BP control long term.    ????   AKI on CKD -progression to CKD5   -Cr increased from low-mid 3's to 4.8, then 5.1   -NS @75 /hr   -Urine studies. Spot prot/cr=2.9, Renal US 11/6   -Renal consulting   -2' PTH at 268 needs Active Vit D   ????   N/V and abdominal pain   -Etiology unclear, improving with supportive care and anti-emetics. Likely viral    ????   Headaches   -On tylenol prn      Hospital Course:    35 y.o. female with PMHX of Headaches, CKD3/4, resistant HTN who presents with 3 days of mild abdominal pain with N/V.  She denies any F/C or diarrhea.   She reports compliance with her 3 medications including coreg, norvasc, and HCTZ, does not appear to have been taking clonidine.  She did follow up with Dr  Inetta FermoMirvoski after discharge back in 06/2015 for similar admission and has a planned follow-up.  She  checks BP at home occasionaly reports 160-180/100 readings.   In ER BP210/130 on admission she was given her home medications and labetolol IV. She states she was taken off clonidine however this was restarted on admission and her BP ranged 130-140/80.   Nepnrology consulted. Her Cr increased to 5.1 with no signs of prerenal azotemia. Renal U/S showing worse echogenicity and Spot Prot/Cr showing nephrotic range proteinuria  and hematuria.  She may well have glomerulonephritis however further biopsy of  little point as there is little kidney left to save. She was given scripts for all medications as well as calcitriol and she will need close nephrology follow-up.    ??   Significant Diagnostic Studies:   Renal US       Consults:   Treatment Team: Consulting Provider: Michaelyn BarterJames Andrew Shannin Naab, DO; Consulting Provider: Dicie BeamMaxim Mirovski V, MD; Care Manager: Merian CapronPatricia Rizzo; Utilization Review: Brand MalesLinda O Wright, RN      Operative Procedures:   N/A      Disposition:   Home      Diet:   Cardiac      Discharge Condition:    Good      Discharge Medications:       Discharge Medication List as of 03/19/2016 10:59 AM              START taking these medications          Details        calcitRIOL (ROCALTROL) 0.25 mcg capsule  Take 1 Cap by mouth daily. 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, Print, Disp-90 Cap, R-1                     CONTINUE these medications which have CHANGED          Details        amLODIPine (NORVASC) 10 mg tablet  Take 1 Tab by mouth daily., Print, Disp-90 Tab, R-3               carvedilol (COREG) 25 mg tablet  Take 1 Tab by mouth two (2) times daily (with meals)., Print, Disp-180 Tab, R-3               cloNIDine HCl (CATAPRES) 0.2 mg tablet  Take 1 Tab by mouth two (2) times a day., Print, Disp-180 Tab, R-1                     STOP taking these medications                  hydroCHLOROthiazide  (HYDRODIURIL) 25 mg tablet  Comments:    Reason for Stopping:                               Recent Results (from the past 24 hour(s))     RENAL FUNCTION PANEL          Collection Time: 03/19/16  7:20 AM         Result  Value  Ref Range            Sodium  138  136 - 145 mmol/L       Potassium  3.6  3.5 - 5.5 mmol/L       Chloride  102  100 - 108 mmol/L       CO2  29  21 - 32 mmol/L       Anion gap  7  3.0 - 18 mmol/L       Glucose  118 (H)  74 - 99 mg/dL       BUN  55 (H)  7.0 - 18 MG/DL       Creatinine  1.615.16 (H)  0.6 - 1.3 MG/DL       BUN/Creatinine ratio  11 (L)  12 - 20  GFR est AA  12 (L)  >60 ml/min/1.86m2       GFR est non-AA  10 (L)  >60 ml/min/1.54m2       Calcium  8.0 (L)  8.5 - 10.1 MG/DL       Phosphorus  2.9  2.5 - 4.9 MG/DL            Albumin  2.7 (L)  3.4 - 5.0 g/dL           Follow-Up And Discharge Instructions:      Follow-up Information        Follow up With  Details  Comments  Contact Info             Jenene Slicker, MD  Call    [address]   [address]   [place] [address]   [phone]                Dicie Beam, MD  Schedule an appointment as soon as possible for a visit    [address]   [address]   [place] [address]   [phone]                      Wound Care/Supplies:    N/A         Dr Malka So DO   [facility]   Hospitalist Division            Time Spent:       Cc: Jenene Slicker, MD

## 2016-03-19 NOTE — Other (Addendum)
Bedside, Verbal and Written shift change report given to Nicholes Stairsiffany Sessom, RN (oncoming nurse) by Paulita CradleIrene Afriyie, RN (offgoing nurse). Report included the following information SBAR, Kardex, Intake/Output, MAR, Recent Results, Med Rec Status, Procedure Summary and Cardiac Rhythm NSR.  ????  0720 - Shift assessment completed. Pt alert and oriented x4. No respiratory distress noted. No c/o pain reported. Pt refusing IVF at this time. Call bell within reach, bed in low position. Will continue to monitor.  ????  1100 - Dr Janee Mornhompson in pt's room to speak with her about discharge and prescriptions.  ??????  1130 - I have reviewed discharge instructions with the patient and significant other. The patient and significant other verbalized understanding. Discharge medications reviewed with patient and significant other and appropriate educational materials and side effects teaching were provided. IV catheter discontinued intact. Site without signs and symptoms of complications. Dressing and pressure applied. Patient armband removed and shredded.    1220 - Shift re-assessment completed, no change in pt condition.    1222 - Dr Janee Mornhompson wrote wrong dates on pt's work note, re-wrote pt's work note and signed. Dr Janee Mornhompson unable to come back to unit at this time.    1230 - Pt discharged to home with her significant other. All pt belongings went with her.

## 2016-03-19 NOTE — Other (Signed)
Blood pressure was reported to [employer] [place]

## 2016-03-19 NOTE — Progress Notes (Signed)
[facility]       March 19, 2016       RE: Bethany Keller      To Whom It May Concern,    This is to certify that Bethany Keller was admitted to the hospital on 03/18/2016 and may may return to work on 03/20/2016.    Please feel free to contact my office if you have any questions or concerns.  Thank you for your assistance in this matter.      Sincerely,  Michaelyn BarterJames Andrew Bettie Swavely, DO   [phone]

## 2016-03-19 NOTE — Other (Addendum)
[facility]      03/19/2016      RE: Bethany Keller      To Whom it May Concern:      This is to certify that Bethany Keller was admitted to the hospital on 03/17/16 and may return to work on 03/20/16.      Please feel free to contact my office if you have any questions or concerns.  Thank you for your assistance in this matter.    Sincerely,      Fausto Skillerniffany M Sessom, RN  [phone]

## 2016-03-19 NOTE — Progress Notes (Signed)
RENAL PROGRESS NOTE        Bethany Rogueiffany Keller         Assessment/Plan:   ?? AKI in a setting of  htn emergency/volume depletion due to n/v. Scr is up slightly since yesterday, likely due to improved bp control. Component of aki in general may be minimal and current scr may reflect progression of underlying ckd. No labs between march of this year and now. Only time will tell.   ?? 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 Renal us is with increased echogenicity. At this point unlikely to benefit from kidney biopsy due to being beyond the point of no return in terms of ckd progression.   ?? Hypokalemia. Replaced.   ?? HTN. Controlled. After d/c continue current doses of clonidine, amlodipine and coreg.   ?? N/V/abd pain. Etio? Acute gastroenteritis? Resolved. Doubt manifestation of uremia.  ?? Secondary hyperparathyroidism/hyperphosphatemia. Monitor at this time.   ?? After d/c patient is to f/u with me in the office, she has appt 03/31/2016. Importance d/w patient.                                                                                                                               Subjective:  Patient complaints off: Feels better. No SOB/CP/N/V. No HA. Appetite is back to normal. Wants to go home.    Patient Active Problem List   Diagnosis Code   ??? Acute parametritis and pelvic cellulitis N73.0   ??? Hypertension I10   ??? Acute renal failure (ARF) (HCC) N17.9   ??? Hypertensive emergency I16.1   ??? Headache R51   ??? Malignant hypertension I10   ??? CKD (chronic kidney disease) stage 3, GFR 30-59 ml/min N18.3   ??? ESRD (end stage renal disease) (HCC) N18.6       Current Facility-Administered Medications   Medication Dose Route Frequency Provider Last Rate Last Dose   ??? potassium chloride SR (KLOR-CON 10) tablet 40 mEq  40 mEq Oral BID Zorita PangIfeanyichukwu G Chukwuma, MD   40 mEq at 03/19/16 0212    ??? cloNIDine HCl (CATAPRES) tablet 0.1 mg  0.1 mg Oral Q8H Ruthene Methvin V, MD   0.1 mg at 03/19/16 0651   ??? amLODIPine (NORVASC) tablet 10 mg  10 mg Oral DAILY Darren Anselm PancoastA Willis, PA   10 mg at 03/19/16 0921   ??? labetalol (NORMODYNE;TRANDATE) injection 40 mg  40 mg IntraVENous Q2H PRN Michaelyn BarterJames Andrew Thompson, DO   40 mg at 03/17/16 1656   ??? LORazepam (ATIVAN) tablet 1 mg  1 mg Oral ONCE PRN Michaelyn BarterJames Andrew Thompson, DO       ??? carvedilol (COREG) tablet 25 mg  25 mg Oral BID WITH MEALS Michaelyn BarterJames Andrew Thompson, DO   25 mg at 03/19/16 [id number]   ??? 0.9% sodium chloride infusion  75 mL/hr IntraVENous CONTINUOUS Michaelyn BarterJames Andrew Thompson, DO   Stopped at  03/18/16 2014   ??? acetaminophen (TYLENOL) solution 650 mg  650 mg Oral Q6H PRN Michaelyn BarterJames Andrew Thompson, DO       ??? ondansetron (ZOFRAN ODT) tablet 4 mg  4 mg Oral Q6H PRN Michaelyn BarterJames Andrew Thompson, DO       ??? heparin (porcine) injection 5,000 Units  5,000 Units SubCUTAneous Q8H Michaelyn BarterJames Andrew Thompson, DO   5,000 Units at 03/19/16 0149       Objective  Vitals:    03/18/16 1936 03/18/16 2337 03/19/16 0418 03/19/16 0817   BP: 129/71 137/89 140/75 (!) 155/101   Pulse: 76 92 77 83   Resp: 20 20 20 20    Temp: 98.4 ??F (36.9 ??C) 98.3 ??F (36.8 ??C) 98.4 ??F (36.9 ??C) 99.1 ??F (37.3 ??C)   SpO2: 96% 92% 96% 91%   Weight:       Height:             Intake/Output Summary (Last 24 hours) at 03/19/16 1002  Last data filed at 03/19/16 0650   Gross per 24 hour   Intake          1893.75 ml   Output                2 ml   Net          1891.75 ml           Admission weight: Weight: 72.6 kg (160 lb) (03/17/16 0729)  Last Weight Metrics:  Weight Loss Metrics 03/17/2016 07/11/2015 08/05/2012 10/07/2011   Today's Wt 160 lb 154 lb 9.6 oz 180 lb 180 lb   BMI 26.63 kg/m2 25.73 kg/m2 29.95 kg/m2 29.95 kg/m2             Physical Assessment:     General: NAD, alert and oriented.  Neck: No jvd.  LUNGS: Clear to Auscultation, No rales, rhonchi or wheezes.  CVS EXM: S1, S2  RRR, no murmurs/gallops/rubs.  Abdomen: soft, non tender.    Lower Extremities:  trace edema.       Lab    CBC w/Diff Recent Labs      03/17/16   0955   WBC  17.3*   RBC  4.78   HGB  13.8   HCT  37.5   PLT  169   GRANS  93*   LYMPH  5*   EOS  0        Chemistry Recent Labs      03/19/16   0720  03/18/16   0350   03/17/16   0955   GLU  118*  93   --   139*   NA  138  140   --   135*   K  3.6  2.7*   --   3.9   CL  102  99*   --   95*   CO2  29  30   --   30   BUN  55*  53*   --   47*   CREA  5.16*  5.13*   --   4.80*   CA  8.0*  8.2*   8.2*   --   8.9   AGAP  7  11   --   10   BUCR  11*  10*   --   10*   AP   --    --    --   98   TP   --    --    --  7.9   ALB  2.7*  2.7*   --   3.5   GLOB   --    --    --   4.4*   AGRAT   --    --    --   0.8   PHOS  2.9  6.2*   < >   --     < > = values in this interval not displayed.         No results found for: IRON, FE, TIBC, IBCT, PSAT, FERR   Lab Results   Component Value Date/Time    Calcium 8.0 03/19/2016 07:20 AM    Phosphorus 2.9 03/19/2016 07:20 AM        Max Bryon Lions, M.D.  [facility]  Phone [phone]  Pager [phone]

## 2016-03-19 NOTE — Discharge Summary (Signed)
[facility]  Hospitalist Division    Discharge Summary      Patient: Bethany Keller MRN: [id number]  CSN: [id number]    Date of Birth: 06/14/1980  Age: 35 y.o.  Sex: female    DOA: 03/17/2016 LOS:  LOS: 2 days   Discharge Date: 03/19/16     PCP:  Jenene SlickerPravin M Deshmukh, MD    Chief Complaint:    Chief Complaint   Patient presents with   ??? Vomiting     Admission Diagnosis:   Hospital Problems as of 03/19/2016  Date Reviewed: 10/08/2011          Codes Class Noted - Resolved POA    ESRD (end stage renal disease) (HCC) ICD-10-CM: N18.6  ICD-9-CM: 585.6  03/17/2016 - Present Unknown              Discharge Diagnoses:    Malignant hypertension/Hypertensive crisis:   -restarted home meds. With nephrology adjusting. BP normalized by 11/7   -last TTE EF50-55% LVH, grade2dd  -will need better BP control long term.   ????  AKI on CKD -progression to CKD5  -Cr increased from low-mid 3's to 4.8, then 5.1  -NS @75 /hr  -Urine studies. Spot prot/cr=2.9, Renal US 11/6  -Renal consulting  -2' PTH at 268 needs Active Vit D  ????  N/V and abdominal pain  -Etiology unclear, improving with supportive care and anti-emetics. Likely viral   ????  Headaches  -On tylenol prn    Hospital Course:   35 y.o. female with PMHX of Headaches, CKD3/4, resistant HTN who presents with 3 days of mild abdominal pain with N/V.  She denies any F/C or diarrhea.  She reports compliance with her 3 medications including coreg, norvasc, and HCTZ, does not appear to have been taking clonidine.  She did follow up with Dr Inetta FermoMirvoski after discharge back in 06/2015 for similar admission and has a planned follow-up.  She checks BP at home occasionaly reports 160-180/100 readings.   In ER BP210/130 on admission she was given her home medications and labetolol IV. She states she was taken off clonidine however this was restarted on admission and her BP ranged 130-140/80.  Nepnrology consulted. Her Cr increased to 5.1 with no signs  of prerenal azotemia. Renal U/S showing worse echogenicity and Spot Prot/Cr showing nephrotic range proteinuria and hematuria.  She may well have glomerulonephritis however further biopsy of little point as there is little kidney left to save. She was given scripts for all medications as well as calcitriol and she will need close nephrology follow-up.   ??  Significant Diagnostic Studies:  Renal US     Consults:  Treatment Team: Consulting Provider: Michaelyn BarterJames Andrew Jadd Gasior, DO; Consulting Provider: Dicie BeamMaxim Mirovski V, MD; Care Manager: Merian CapronPatricia Rizzo; Utilization Review: Brand MalesLinda O Wright, RN    Operative Procedures:  N/A    Disposition:  Home    Diet:  Cardiac    Discharge Condition:   Good    Discharge Medications:    Discharge Medication List as of 03/19/2016 10:59 AM      START taking these medications    Details   calcitRIOL (ROCALTROL) 0.25 mcg capsule Take 1 Cap by mouth daily. 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, Print, Disp-90 Cap, R-1         CONTINUE these medications which have CHANGED    Details   amLODIPine (NORVASC) 10 mg tablet Take 1 Tab by mouth daily., Print, Disp-90 Tab, R-3  carvedilol (COREG) 25 mg tablet Take 1 Tab by mouth two (2) times daily (with meals)., Print, Disp-180 Tab, R-3      cloNIDine HCl (CATAPRES) 0.2 mg tablet Take 1 Tab by mouth two (2) times a day., Print, Disp-180 Tab, R-1         STOP taking these medications       hydroCHLOROthiazide (HYDRODIURIL) 25 mg tablet Comments:   Reason for Stopping:               Recent Results (from the past 24 hour(s))   RENAL FUNCTION PANEL    Collection Time: 03/19/16  7:20 AM   Result Value Ref Range    Sodium 138 136 - 145 mmol/L    Potassium 3.6 3.5 - 5.5 mmol/L    Chloride 102 100 - 108 mmol/L    CO2 29 21 - 32 mmol/L    Anion gap 7 3.0 - 18 mmol/L    Glucose 118 (H) 74 - 99 mg/dL    BUN 55 (H) 7.0 - 18 MG/DL    Creatinine 9.605.16 (H) 0.6 - 1.3 MG/DL    BUN/Creatinine ratio 11 (L) 12 - 20       GFR est AA 12 (L) >60 ml/min/1.1273m2    GFR est non-AA 10 (L) >60 ml/min/1.7273m2    Calcium 8.0 (L) 8.5 - 10.1 MG/DL    Phosphorus 2.9 2.5 - 4.9 MG/DL    Albumin 2.7 (L) 3.4 - 5.0 g/dL       Follow-Up And Discharge Instructions:   Follow-up Information     Follow up With Details Comments Contact Info    Jenene SlickerPravin M Deshmukh, MD Call  [address]  [address]  [place] [id number]  [phone]      Dicie BeamMaxim Mirovski V, MD Schedule an appointment as soon as possible for a visit  [address]  [id number]  [place] [id number]  [phone]              Wound Care/Supplies:   N/A      Dr Malka SoJames A Ellowyn Rieves DO  [facility]  Hospitalist Division        Time Spent:  35min    Cc: Jenene SlickerPravin M Deshmukh, MD

## 2016-03-20 LAB — CATECHOLAMINES, PLASMA
Dopamine, plasma: <30 pg/mL (ref 0–48)
Epinephrine, plasma: <15 pg/mL (ref 0–62)
Norepinephrine, plasma: 89 pg/mL (ref 0–874)

## 2016-03-20 LAB — METANEPHRINES, PLASMA
Metanephrine, free: 18 pg/mL (ref 0–62)
Normetanephrine, free: 27 pg/mL (ref 0–145)

## 2016-09-27 ENCOUNTER — Inpatient Hospital Stay (HOSPITAL_COMMUNITY)
Admission: AD | Admit: 2016-09-27 | Discharge: 2016-10-12 | DRG: 628 | Disposition: A | Discharge status: Home / Self Care | Payer: Medicaid - Out of State | Source: Other Acute Inpatient Hospital | Attending: Internal Medicine | Admitting: Internal Medicine

## 2016-09-27 ENCOUNTER — Encounter (HOSPITAL_COMMUNITY): Payer: Self-pay | Admitting: Nurse Practitioner

## 2016-09-27 ENCOUNTER — Inpatient Hospital Stay (HOSPITAL_COMMUNITY): Payer: Medicaid - Out of State

## 2016-09-27 DIAGNOSIS — R109 Unspecified abdominal pain: Secondary | ICD-10-CM

## 2016-09-27 DIAGNOSIS — E8770 Fluid overload, unspecified: Principal | ICD-10-CM | POA: Diagnosis present

## 2016-09-27 DIAGNOSIS — Z6825 Body mass index (BMI) 25.0-25.9, adult: Secondary | ICD-10-CM | POA: Diagnosis not present

## 2016-09-27 DIAGNOSIS — D631 Anemia in chronic kidney disease: Secondary | ICD-10-CM | POA: Diagnosis present

## 2016-09-27 DIAGNOSIS — J9601 Acute respiratory failure with hypoxia: Secondary | ICD-10-CM | POA: Diagnosis not present

## 2016-09-27 DIAGNOSIS — G9349 Other encephalopathy: Secondary | ICD-10-CM | POA: Diagnosis present

## 2016-09-27 DIAGNOSIS — E349 Endocrine disorder, unspecified: Secondary | ICD-10-CM

## 2016-09-27 DIAGNOSIS — M542 Cervicalgia: Secondary | ICD-10-CM

## 2016-09-27 DIAGNOSIS — E669 Obesity, unspecified: Secondary | ICD-10-CM | POA: Diagnosis present

## 2016-09-27 DIAGNOSIS — Z79899 Other long term (current) drug therapy: Secondary | ICD-10-CM

## 2016-09-27 DIAGNOSIS — H9190 Unspecified hearing loss, unspecified ear: Secondary | ICD-10-CM | POA: Diagnosis present

## 2016-09-27 DIAGNOSIS — E876 Hypokalemia: Secondary | ICD-10-CM | POA: Diagnosis present

## 2016-09-27 DIAGNOSIS — N179 Acute kidney failure, unspecified: Secondary | ICD-10-CM | POA: Diagnosis present

## 2016-09-27 DIAGNOSIS — Z95828 Presence of other vascular implants and grafts: Secondary | ICD-10-CM

## 2016-09-27 DIAGNOSIS — Z419 Encounter for procedure for purposes other than remedying health state, unspecified: Secondary | ICD-10-CM

## 2016-09-27 DIAGNOSIS — M549 Dorsalgia, unspecified: Secondary | ICD-10-CM | POA: Diagnosis present

## 2016-09-27 DIAGNOSIS — R112 Nausea with vomiting, unspecified: Secondary | ICD-10-CM

## 2016-09-27 DIAGNOSIS — R0902 Hypoxemia: Secondary | ICD-10-CM

## 2016-09-27 DIAGNOSIS — Z992 Dependence on renal dialysis: Secondary | ICD-10-CM

## 2016-09-27 DIAGNOSIS — Z9114 Patient's other noncompliance with medication regimen: Secondary | ICD-10-CM

## 2016-09-27 DIAGNOSIS — Z9119 Patient's noncompliance with other medical treatment and regimen: Secondary | ICD-10-CM

## 2016-09-27 DIAGNOSIS — N189 Chronic kidney disease, unspecified: Secondary | ICD-10-CM

## 2016-09-27 DIAGNOSIS — Z9911 Dependence on respirator [ventilator] status: Secondary | ICD-10-CM

## 2016-09-27 DIAGNOSIS — I1311 Hypertensive heart and chronic kidney disease without heart failure, with stage 5 chronic kidney disease, or end stage renal disease: Secondary | ICD-10-CM | POA: Diagnosis present

## 2016-09-27 DIAGNOSIS — N186 End stage renal disease: Secondary | ICD-10-CM | POA: Diagnosis present

## 2016-09-27 DIAGNOSIS — I161 Hypertensive emergency: Secondary | ICD-10-CM | POA: Diagnosis present

## 2016-09-27 DIAGNOSIS — Z9115 Patient's noncompliance with renal dialysis: Secondary | ICD-10-CM

## 2016-09-27 HISTORY — DX: Chronic kidney disease, unspecified: N18.9

## 2016-09-27 HISTORY — DX: Essential (primary) hypertension: I10

## 2016-09-27 LAB — COMPREHENSIVE METABOLIC PANEL
ALK PHOS: 63 U/L (ref 38–126)
ALT: 19 U/L (ref 14–54)
AST: 16 U/L (ref 15–41)
Albumin: 3.2 g/dL — ABNORMAL LOW (ref 3.5–5.0)
Anion gap: 15 (ref 5–15)
BILIRUBIN TOTAL: 0.8 mg/dL (ref 0.3–1.2)
BUN: 76 mg/dL — AB (ref 6–20)
CALCIUM: 8.3 mg/dL — AB (ref 8.9–10.3)
CO2: 20 mmol/L — AB (ref 22–32)
Chloride: 104 mmol/L (ref 101–111)
Creatinine, Ser: 9.75 mg/dL — ABNORMAL HIGH (ref 0.44–1.00)
GFR calc Af Amer: 5 mL/min — ABNORMAL LOW (ref >60)
GFR calc non Af Amer: 5 mL/min — ABNORMAL LOW (ref >60)
GLUCOSE: 104 mg/dL — AB (ref 65–99)
POTASSIUM: 3.3 mmol/L — AB (ref 3.5–5.1)
Sodium: 139 mmol/L (ref 135–145)
TOTAL PROTEIN: 6.4 g/dL — AB (ref 6.5–8.1)

## 2016-09-27 LAB — CBC
HCT: 21.6 % — ABNORMAL LOW (ref 36.0–46.0)
HEMOGLOBIN: 7.6 g/dL — AB (ref 12.0–15.0)
MCH: 28.3 pg (ref 26.0–34.0)
MCHC: 35.2 g/dL (ref 30.0–36.0)
MCV: 80.3 fL (ref 78.0–100.0)
Platelets: 212 10*3/uL (ref 150–400)
RBC: 2.69 MIL/uL — ABNORMAL LOW (ref 3.87–5.11)
RDW: 14.6 % (ref 11.5–15.5)
WBC: 14.1 10*3/uL — ABNORMAL HIGH (ref 4.0–10.5)

## 2016-09-27 LAB — LACTIC ACID, PLASMA: Lactic Acid, Venous: 0.6 mmol/L (ref 0.5–1.9)

## 2016-09-27 LAB — GLUCOSE, CAPILLARY
GLUCOSE-CAPILLARY: 98 mg/dL (ref 65–99)
Glucose-Capillary: 112 mg/dL — ABNORMAL HIGH (ref 65–99)

## 2016-09-27 LAB — FIBRINOGEN: Fibrinogen: 750 mg/dL — ABNORMAL HIGH (ref 210–475)

## 2016-09-27 LAB — MAGNESIUM: Magnesium: 2.2 mg/dL (ref 1.7–2.4)

## 2016-09-27 LAB — PROCALCITONIN: Procalcitonin: 0.73 ng/mL

## 2016-09-27 LAB — PHOSPHORUS: Phosphorus: 7 mg/dL — ABNORMAL HIGH (ref 2.5–4.6)

## 2016-09-27 LAB — MRSA PCR SCREENING: MRSA by PCR: NEGATIVE

## 2016-09-27 MED ORDER — ONDANSETRON HCL 4 MG/2ML IJ SOLN
4.0000 mg | Freq: Four times a day (QID) | INTRAMUSCULAR | Status: DC | PRN
  Administered 2016-09-27 – 2016-10-10 (×12): 4 mg via INTRAVENOUS
  Filled 2016-09-27 (×13): qty 2

## 2016-09-27 MED ORDER — ACETAMINOPHEN 650 MG RE SUPP
650.0000 mg | Freq: Four times a day (QID) | RECTAL | Status: DC | PRN
  Filled 2016-09-27: qty 1

## 2016-09-27 MED ORDER — FUROSEMIDE 10 MG/ML IJ SOLN: 80.0000 mg | Freq: Every day | INTRAMUSCULAR | Status: DC

## 2016-09-27 MED ORDER — CLONIDINE HCL 0.2 MG PO TABS
0.2000 mg | ORAL_TABLET | Freq: Three times a day (TID) | ORAL | Status: DC
  Administered 2016-09-28 – 2016-10-01 (×12): 0.2 mg via ORAL
  Filled 2016-09-27 (×15): qty 1

## 2016-09-27 MED ORDER — SODIUM CHLORIDE 0.9 % IV SOLN
250.0000 mL | INTRAVENOUS | Status: DC | PRN
  Administered 2016-09-28: 10 mL/h via INTRAVENOUS

## 2016-09-27 MED ORDER — ACETAMINOPHEN 325 MG PO TABS
650.0000 mg | ORAL_TABLET | Freq: Four times a day (QID) | ORAL | Status: DC | PRN
  Administered 2016-09-27 – 2016-10-11 (×16): 650 mg via ORAL
  Filled 2016-09-27 (×14): qty 2

## 2016-09-27 MED ORDER — PANTOPRAZOLE SODIUM 40 MG IV SOLR
40.0000 mg | Freq: Every day | INTRAVENOUS | Status: DC
  Administered 2016-09-28 (×2): 40 mg via INTRAVENOUS
  Filled 2016-09-27 (×2): qty 40

## 2016-09-27 MED ORDER — HYDRALAZINE HCL 50 MG PO TABS
50.0000 mg | ORAL_TABLET | Freq: Three times a day (TID) | ORAL | Status: DC
  Administered 2016-09-27 – 2016-10-03 (×15): 50 mg via ORAL
  Filled 2016-09-27 (×17): qty 1

## 2016-09-27 MED ORDER — HEPARIN SODIUM (PORCINE) 5000 UNIT/ML IJ SOLN
5000.0000 [IU] | Freq: Three times a day (TID) | INTRAMUSCULAR | Status: DC
  Administered 2016-10-10: 5000 [IU] via SUBCUTANEOUS
  Filled 2016-09-27 (×12): qty 1

## 2016-09-27 MED ORDER — CARVEDILOL 25 MG PO TABS
25.0000 mg | ORAL_TABLET | Freq: Two times a day (BID) | ORAL | Status: DC
  Administered 2016-09-28 – 2016-10-04 (×10): 25 mg via ORAL
  Filled 2016-09-27 (×11): qty 1

## 2016-09-27 MED ORDER — LABETALOL HCL 5 MG/ML IV SOLN
10.0000 mg | INTRAVENOUS | Status: AC | PRN
  Administered 2016-09-28: 10 mg via INTRAVENOUS
  Administered 2016-10-01 – 2016-10-02 (×3): 20 mg via INTRAVENOUS
  Administered 2016-10-02: 10 mg via INTRAVENOUS
  Administered 2016-10-02 – 2016-10-04 (×5): 20 mg via INTRAVENOUS
  Filled 2016-09-27 (×10): qty 4

## 2016-09-27 MED ORDER — FUROSEMIDE 10 MG/ML IJ SOLN: 80.0000 mg | Freq: Two times a day (BID) | INTRAMUSCULAR | Status: DC

## 2016-09-27 MED ORDER — AMLODIPINE BESYLATE 10 MG PO TABS
10.0000 mg | ORAL_TABLET | Freq: Every day | ORAL | Status: DC
  Administered 2016-09-28 – 2016-10-11 (×13): 10 mg via ORAL
  Filled 2016-09-27 (×16): qty 1

## 2016-09-27 MED ORDER — NICARDIPINE HCL IN NACL 20-0.86 MG/200ML-% IV SOLN
3.0000 mg/h | INTRAVENOUS | Status: DC
  Administered 2016-09-27: 12 mg/h via INTRAVENOUS
  Administered 2016-09-27: 15 mg/h via INTRAVENOUS
  Administered 2016-09-28: 8 mg/h via INTRAVENOUS
  Filled 2016-09-27 (×4): qty 200

## 2016-09-27 MED ORDER — POTASSIUM CHLORIDE 10 MEQ/100ML IV SOLN
10.0000 meq | INTRAVENOUS | Status: AC
  Administered 2016-09-28 (×3): 10 meq via INTRAVENOUS
  Filled 2016-09-27 (×3): qty 100

## 2016-09-27 NOTE — H&P (Signed)
PULMONARY / CRITICAL CARE MEDICINE   Name: Marcia Thornton MRN: [id number] DOB: 04/26/1981    ADMISSION DATE:  09/27/2016 CONSULTATION DATE:  09/27/2016  REFERRING MD:  Dr. Clifton Jamesabanas, [place]   CHIEF COMPLAINT:  Uremia   HISTORY OF PRESENT ILLNESS:   36 year old female with CKD (unknown stage) and HTN (history of noncompliance with medications)  Admitted 5/15 to [facility] for [facility] Emergency. Placed on Cardene gtt with improvement of BP. Patient reportedly does not take prescribed medications, in ED stated that PCP d/c clonidine 3 days ago due to reported dizziness, and since patient has had elevated BP with nausea and vomiting. During stay patient Creatine and BUN continued to rise. Nephrology was consulted. Patient was deemed stable and not in need of HD at this time, however patient was also refusing HD at the time. Patient diuresed with 80 mg Lasix > continues to make urine (1.4L out 5/18). 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 On 5/19 patient agreeable to HD, however [facility] [place] [place] [facility] staff to accommodate over the weekend. [facility]. PCCM asked to admit.   PAST MEDICAL HISTORY :  She  has a past medical history of Chronic kidney disease and Hypertension.  PAST SURGICAL HISTORY: She  has no past surgical history on file.  No Known Allergies  No current facility-administered medications on file prior to encounter.    No current outpatient prescriptions on file prior to encounter.    FAMILY HISTORY:  Her has no family status information on file.    SOCIAL HISTORY: She    REVIEW OF SYSTEMS:   All negative; except for those that are bolded, which indicate positives.  Constitutional: weight loss, weight gain, night sweats, fevers, chills, fatigue, weakness.  HEENT: headaches, sore throat, sneezing, nasal congestion, post nasal drip,  difficulty swallowing, tooth/dental problems, visual complaints, visual changes, ear aches. Neuro: difficulty with speech, weakness, numbness, ataxia. CV:  chest pain, orthopnea, PND, swelling in lower extremities, dizziness, palpitations, syncope.  Resp: cough, hemoptysis, dyspnea, wheezing. GI: heartburn, indigestion, abdominal pain, nausea, vomiting, diarrhea, constipation, change in bowel habits, loss of appetite, hematemesis, melena, hematochezia.  GU: dysuria, change in color of urine, urgency or frequency, flank pain, hematuria. MSK: joint pain or swelling, decreased range of motion. Psych: change in mood or affect, depression, anxiety, suicidal ideations, homicidal ideations. Skin: rash, itching, bruising.   SUBJECTIVE:  On Cardene gtt, reports continued nausea.   VITAL SIGNS: BP (!) 174/103   Pulse (!) 110   Temp 98.7 F (37.1 C) (Oral)   Resp (!) 25   Wt 75.4 kg (166 lb 3.6 oz)   LMP  (LMP Unknown)   SpO2 99%   HEMODYNAMICS:    VENTILATOR SETTINGS:    INTAKE / OUTPUT: No intake/output data recorded.  PHYSICAL EXAMINATION: General:  Adult female, no distress  Neuro:  Lethargic, follows commands, awakens to verbal stimuli, answers questions appropriately  HEENT:  Normocephalic  Cardiovascular:  Tachy, Grade 2 Murmur  Lungs:  Clear breath sounds, non-labored  Abdomen:  Non-distended, non-tender, active bowel sounds  Musculoskeletal:  No acute  Skin:  Warm, dry, intact   LABS:  BMET No results for input(s): NA, K, CL, CO2, BUN, CREATININE, GLUCOSE in the last 168 hours.  Electrolytes  Recent Labs Lab 09/27/16 2012  MG 2.2  PHOS 7.0*    CBC  Recent Labs Lab  09/27/16 2012  WBC 14.1*  HGB 7.6*  HCT 21.6*  PLT 212    Coag's No results for input(s): APTT, INR in the last 168 hours.  Sepsis Markers  Recent Labs Lab 09/27/16 2017  LATICACIDVEN 0.6    ABG No results for input(s): PHART, PCO2ART, PO2ART in the last 168 hours.  Liver  Enzymes No results for input(s): AST, ALT, ALKPHOS, BILITOT, ALBUMIN in the last 168 hours.  Cardiac Enzymes No results for input(s): TROPONINI, PROBNP in the last 168 hours.  Glucose  Recent Labs Lab 09/27/16 1950  GLUCAP 112*    Imaging Dg Chest Port 1 View  Result Date: 09/27/2016 CLINICAL DATA:  Hypoxia EXAM: PORTABLE CHEST 1 VIEW COMPARISON:  None. FINDINGS: Cardiac shadow is mildly enlarged. The lungs are clear bilaterally. No focal infiltrate is seen. No bony abnormality is noted. IMPRESSION: No acute abnormality noted. Electronically Signed   By: Alcide Clever M.D.   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 Rathke's cleft cyst vs pituitary adenoma vs craniopharyngioma) CXR 5/19 > No acute, Cardiac shadow is mildly enlarged  ECHO 5/19 >>  CULTURES: U/A 5/16 > Negative   ANTIBIOTICS: None.   SIGNIFICANT EVENTS: 5/15 > Presents OSH with HTN Emergency  5/19 > Transferred to [facility]   LINES/TUBES: PIV   DISCUSSION: 36 year old female presented 5/15 to OSH with HTN Emergency with medical noncompliance. H/O of CKD, during stay developed acute on chronic renal failure with uremia.   ASSESSMENT / PLAN:  PULMONARY A: Acute Hypoxic Respiratory Failure in setting of volume overload  P:   Maintain Oxygen >92 Trend CXR Pulmonary Hygiene   CARDIOVASCULAR A:  HTN Emergency  P:  Cardia Monitoring  Continue Cardene gtt to maintain Systolic <160 Continue Norvasc, Hydralazine, and Coreg  ECHO  Trend Trop   RENAL A:   Acute on Chronic Renal Failure  Hypokalemia  Hyperphosphatemia  P:   Nephrology consulted > will plan HD ? Monday unless develops urgent need over the weekend  Trend BMP Replace electrolytes as needed  Diuresis as needed > currently not volume overloaded  GASTROINTESTINAL A:    Nausea  Protein Calorie Malnutrition  P:   Zofran PRN  Advance diet as tolerated   HEMATOLOGIC A:   DVT Prophylaxis   Anemia of Chronic Diease  P:  Trend CBC Heparin SQ  Maintain Hbg >7  INFECTIOUS A:   Leukocytosis  -Afebrile  P:   Trend WBC and Fever Curve  Trend Procal and Lactic Acid   ENDOCRINE A:   No issues    P:   Trend Glucose   NEUROLOGIC A:   Acute Encephalopathy in setting of Uremia  P:   RASS goal: 0 Monitor    FAMILY  - Updates: family updated at bedside    - Inter-disciplinary family meet or Palliative Care meeting due by: 5/16   CC Time: 45 minutes   Jovita Kussmaul, AGACNP-BC  Pulmonary & Critical Care  Pgr: [phone]  PCCM Pgr: [phone]

## 2016-09-27 NOTE — Progress Notes (Signed)
eLink Physician-Brief Progress Note Patient Name: Marcia Thornton DOB: 12/17/1980 MRN: [id number]   Date of Service  09/27/2016  HPI/Events of Note  Patient c/o headache and requests Tylenol.  eICU Interventions  Will order Tylenol 650 mg PO Q 6 hours PRN headache.      Intervention Category Intermediate Interventions: Pain - evaluation and management  Lucia Harm Eugene 09/27/2016, 9:16 PM

## 2016-09-28 ENCOUNTER — Inpatient Hospital Stay (HOSPITAL_COMMUNITY): Payer: Medicaid - Out of State

## 2016-09-28 LAB — PROTIME-INR
INR: 1.12
Prothrombin Time: 14.5 seconds (ref 11.4–15.2)

## 2016-09-28 LAB — BASIC METABOLIC PANEL
ANION GAP: 12 (ref 5–15)
BUN: 77 mg/dL — ABNORMAL HIGH (ref 6–20)
CALCIUM: 7.9 mg/dL — AB (ref 8.9–10.3)
CO2: 22 mmol/L (ref 22–32)
CREATININE: 9.75 mg/dL — AB (ref 0.44–1.00)
Chloride: 104 mmol/L (ref 101–111)
GFR, EST AFRICAN AMERICAN: 5 mL/min — AB (ref >60)
GFR, EST NON AFRICAN AMERICAN: 5 mL/min — AB (ref >60)
GLUCOSE: 128 mg/dL — AB (ref 65–99)
Potassium: 3.5 mmol/L (ref 3.5–5.1)
Sodium: 138 mmol/L (ref 135–145)

## 2016-09-28 LAB — PHOSPHORUS: PHOSPHORUS: 7.3 mg/dL — AB (ref 2.5–4.6)

## 2016-09-28 LAB — GLUCOSE, CAPILLARY
GLUCOSE-CAPILLARY: 118 mg/dL — AB (ref 65–99)
GLUCOSE-CAPILLARY: 127 mg/dL — AB (ref 65–99)
GLUCOSE-CAPILLARY: 134 mg/dL — AB (ref 65–99)
Glucose-Capillary: 100 mg/dL — ABNORMAL HIGH (ref 65–99)
Glucose-Capillary: 117 mg/dL — ABNORMAL HIGH (ref 65–99)
Glucose-Capillary: 125 mg/dL — ABNORMAL HIGH (ref 65–99)

## 2016-09-28 LAB — CBC
HCT: 17.7 % — ABNORMAL LOW (ref 36.0–46.0)
HEMOGLOBIN: 6.2 g/dL — AB (ref 12.0–15.0)
MCH: 28.4 pg (ref 26.0–34.0)
MCHC: 35 g/dL (ref 30.0–36.0)
MCV: 81.2 fL (ref 78.0–100.0)
PLATELETS: 195 10*3/uL (ref 150–400)
RBC: 2.18 MIL/uL — AB (ref 3.87–5.11)
RDW: 14.6 % (ref 11.5–15.5)
WBC: 11.3 10*3/uL — ABNORMAL HIGH (ref 4.0–10.5)

## 2016-09-28 LAB — ABO/RH: ABO/RH(D): B POS

## 2016-09-28 LAB — PREPARE RBC (CROSSMATCH)

## 2016-09-28 LAB — TROPONIN I: Troponin I: 0.08 ng/mL (ref <0.03)

## 2016-09-28 LAB — IRON AND TIBC
Iron: 32 ug/dL (ref 28–170)
Saturation Ratios: 12 % (ref 10.4–31.8)
TIBC: 256 ug/dL (ref 250–450)
UIBC: 224 ug/dL

## 2016-09-28 LAB — MAGNESIUM: MAGNESIUM: 2.1 mg/dL (ref 1.7–2.4)

## 2016-09-28 LAB — LACTIC ACID, PLASMA: Lactic Acid, Venous: 0.6 mmol/L (ref 0.5–1.9)

## 2016-09-28 LAB — PROCALCITONIN: PROCALCITONIN: 0.86 ng/mL

## 2016-09-28 LAB — HIV ANTIBODY (ROUTINE TESTING W REFLEX): HIV Screen 4th Generation wRfx: NONREACTIVE

## 2016-09-28 MED ORDER — FUROSEMIDE 40 MG PO TABS
40.0000 mg | ORAL_TABLET | Freq: Two times a day (BID) | ORAL | Status: DC
  Administered 2016-09-28 – 2016-10-01 (×7): 40 mg via ORAL
  Filled 2016-09-28 (×8): qty 1

## 2016-09-28 MED ORDER — SODIUM CHLORIDE 0.9 % IV SOLN
Freq: Once | INTRAVENOUS | Status: AC
  Administered 2016-09-28: 10 mL/h via INTRAVENOUS

## 2016-09-28 NOTE — Consult Note (Signed)
Renal Service Consult Note [facility]  Faith Rogueiffany Habig 09/28/2016 Guy Toney D Requesting Physician:  Dr Isaiah SergeMannam  Reason for Consult:  Acute on CKD IV HPI: The patient is a 36 y.o. year-old w/ hx of severe longstanding HTN (4 BP medications) and CKD. She is followed by Dr Gates RiggMiroski , [place] in [place].  Per pt her baseline creat is around "5", we have no records.  Dr Miroski's phone numbers are [phone] (pgr) and [phone] (office).  [facility] HA and uncont HTN last week on Monday 6 days ago, she was [place] mother [place]. They told her she had "fluid in her lungs", she got IV lasix and that improved.  Her BP was very high and that has improved somewhat, she was treated with Cardene drip initially.  Her creat is 9 this week , they proposed dialysis but patient was against the idea so [facility] was [facility]. I am asked to see for acute/ chron renal failure.    Patient nauseous, mild HA, no abd pain.  SOB overall is better, not not back to baseline yet. CXR shows no acute disease, poss some vasc congestion to my read.  No nsaids. No ankle swelling, no confusion , no muscle twitching.     She also says her creatinine has gone up before, over 5 , and gotten better in the past.    ROS  denies CP  no joint pain   no HA  no blurry vision  no rash  no diarrhea  no dysuria  no difficulty voiding  no change in urine color    Past Medical History  Past Medical History:  Diagnosis Date  . Chronic kidney disease   . Hypertension    Past Surgical History No past surgical history on file. Family History No family history on file. Social History  has no tobacco, alcohol, and drug history on file. Allergies No Known Allergies Home medications Prior to Admission medications   Medication Sig Start Date End Date Taking? Authorizing Provider  amLODipine (NORVASC) 10 MG tablet Take 10 mg by mouth daily.   Yes [provider]  aspirin-acetaminophen-caffeine (EXCEDRIN MIGRAINE) [id number] MG tablet Take 2 tablets by mouth every 6 (six) hours as needed for headache or migraine.   Yes [provider]  calcitRIOL (ROCALTROL) 0.25 MCG capsule Take 0.25 mcg by mouth daily.   Yes [provider]  carvedilol (COREG) 25 MG tablet Take 25 mg by mouth 2 (two) times daily with a meal.   Yes [provider]  cloNIDine (CATAPRES) 0.2 MG tablet Take 0.2 mg by mouth 2 (two) times daily.   Yes [provider]   Liver Function Tests  Recent Labs Lab 09/27/16 2012  AST 16  ALT 19  ALKPHOS 63  BILITOT 0.8  PROT 6.4*  ALBUMIN 3.2*   No results for input(s): LIPASE, AMYLASE in the last 168 hours. CBC  Recent Labs Lab 09/27/16 2012 09/28/16 0519  WBC 14.1* 11.3*  HGB 7.6* 6.2*  HCT 21.6* 17.7*  MCV 80.3 81.2  PLT 212 195   Basic Metabolic Panel  Recent Labs Lab 09/27/16 2012 09/28/16 0519  NA 139 138  K 3.3* 3.5  CL 104 104  CO2 20* 22  GLUCOSE 104* 128*  BUN 76* 77*  CREATININE 9.75* 9.75*  CALCIUM 8.3* 7.9*  PHOS 7.0* 7.3*   Iron/TIBC/Ferritin/ %Sat No results found for: IRON, TIBC, FERRITIN, IRONPCTSAT  Vitals:   09/28/16 0600 09/28/16  4098 09/28/16 0700 09/28/16 0828  BP: (!) 149/100 (!) 161/107 (!) 157/103   Pulse: 98  95   Resp: 19  20   Temp:    97.9 F (36.6 C)  TempSrc:    Oral  SpO2: 93%  93%   Weight:       Exam Gen young AAF, no distress, a bit HOH No rash, cyanosis or gangrene Sclera anicteric, throat clear  No jvd or bruits Chest occ rales L base, R clear , no bronchial BS or wheezing RRR no MRG Abd soft ntnd no mass or ascites +bs GU defer MS no joint effusions or deformity Ext no LE or UE edema / no wounds or ulcers Neuro is alert, Ox 3 , nf, no asterixis or myoclonus  CXR - no active disease  Assessment: 1  Acute on CKD IV - no old labs here, but per pt baseline creat is around 5.  This may be progression to ESRD, or  acute on CKD.  N/V may be uremic symptoms.  I recommended proceeding w/ hemodialysis but patient is strongly against this unless it would be just short-term.  I don't think she needs HD acutely if only short-term, so would just advise following her clinically, see if renal fxn/ N/V improves over the next few days.  If not we will revisit the idea of initiating HD for long-term.  Get records from renal MD in [place] during the week.  2  Vol overload/ hx pulm edema last week at outside facility - improved clinically.  May be a little vol overloaded now, doesn't need any fluids.   3  HTN - long standing, severe, on 4 oral BP meds now.  Will add po lasix.   4  Anemia due to CKD - transfuse prn, check fe/ tibc   Plan - as above  Vinson Moselle MD [facility] pager [phone]   09/28/2016, 10:10 AM

## 2016-09-28 NOTE — Progress Notes (Signed)
Called to patient's room for oxygen desaturation. On arrival, patient was at 83% on 6L nasal cannula, attempted to place venturi mask at 55% on, but SpO2 remained at 86%, transitioned patient to high flow nasal cannula, and she is now at 95% on 13L. Auscultation revealed fine crackles in the bases. RT will continue to monitor.

## 2016-09-28 NOTE — Progress Notes (Signed)
Pt refusing foley catheter, explained need for catheter, pt preferring to use bsc, pt significant other at bedside

## 2016-09-28 NOTE — Progress Notes (Signed)
PULMONARY / CRITICAL CARE MEDICINE   Name: Marcia Thornton MRN: [id number] DOB: 1980/11/04    ADMISSION DATE:  09/27/2016 CONSULTATION DATE:  09/27/2016  REFERRING MD:  Dr. Clifton James   CHIEF COMPLAINT:  Uremia   HISTORY OF PRESENT ILLNESS:   36 year old female with CKD (unknown stage) and HTN (history of noncompliance with medications) Admitted 5/15 to [facility] for HTN Emergency. Placed on Cardene gtt with improvement of BP. Moved to [facility] for possible HD  SUBJECTIVE:  No distress. C/o nausea   VITAL SIGNS: BP (!) 157/103   Pulse 95   Temp 97.9 F (36.6 C) (Oral)   Resp 20   Wt 173 lb 1 oz (78.5 kg)   LMP  (LMP Unknown)   SpO2 93%   HEMODYNAMICS:    VENTILATOR SETTINGS:    INTAKE / OUTPUT:  Intake/Output Summary (Last 24 hours) at 09/28/16 0943 Last data filed at 09/28/16 0343  Gross per 24 hour  Intake              770 ml  Output              450 ml  Net              320 ml   PHYSICAL EXAMINATION: General appearance:  36 Year old  female, well nourished NAD conversant  Eyes: anicteric sclerae, moist conjunctivae; PERRL, EOMI bilaterally. Mouth:  membranes and no mucosal ulcerations; normal hard and soft palate Neck: Trachea midline; neck supple, no JVD Lungs/chest: CTA, with normal respiratory effort and no intercostal retractions CV: RRR, no MRGs  Abdomen: Soft, non-tender; no masses or HSM Extremities: No peripheral edema or extremity lymphadenopathy Skin: Normal temperature, turgor and texture; no rash, ulcers or subcutaneous nodules Neuro/ Psych: Appropriate affect, alert and oriented to person, place and time LABS:  BMET  Recent Labs Lab 09/27/16 2012 09/28/16 0519  NA 139 138  K 3.3* 3.5  CL 104 104  CO2 20* 22  BUN 76* 77*  CREATININE 9.75* 9.75*  GLUCOSE 104* 128*    Electrolytes  Recent Labs Lab 09/27/16 2012 09/28/16 0519  CALCIUM 8.3* 7.9*  MG 2.2 2.1  PHOS 7.0* 7.3*    CBC  Recent Labs Lab 09/27/16 2012  09/28/16 0519  WBC 14.1* 11.3*  HGB 7.6* 6.2*  HCT 21.6* 17.7*  PLT 212 195    Coag's  Recent Labs Lab 09/28/16 0519  INR 1.12    Sepsis Markers  Recent Labs Lab 09/27/16 2012 09/27/16 2017 09/27/16 2345 09/28/16 0519  LATICACIDVEN  --  0.6 0.6  --   PROCALCITON 0.73  --   --  0.86    ABG No results for input(s): PHART, PCO2ART, PO2ART in the last 168 hours.  Liver Enzymes  Recent Labs Lab 09/27/16 2012  AST 16  ALT 19  ALKPHOS 63  BILITOT 0.8  ALBUMIN 3.2*    Cardiac Enzymes  Recent Labs Lab 09/27/16 2012  TROPONINI 0.08*    Glucose  Recent Labs Lab 09/27/16 1950 09/27/16 2347 09/28/16 0423 09/28/16 0830  GLUCAP 112* 98 100* 117*    Imaging Dg Chest Port 1 View  Result Date: 09/27/2016 CLINICAL DATA:  Hypoxia EXAM: PORTABLE CHEST 1 VIEW COMPARISON:  None. FINDINGS: Cardiac shadow is mildly enlarged. The lungs are clear bilaterally. No focal infiltrate is seen. No bony abnormality is noted. IMPRESSION: No acute abnormality noted. Electronically Signed   By: Alcide Clever M.D.   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 Rathke's cleft cyst vs pituitary adenoma vs craniopharyngioma) CXR 5/19 > No acute, Cardiac shadow is mildly enlarged  ECHO 5/19 >>  CULTURES: U/A 5/16 > Negative   ANTIBIOTICS: None.   SIGNIFICANT EVENTS: 5/15 > Presents OSH with HTN Emergency  5/19 > Transferred to [facility]   LINES/TUBES: PIV   ASSESSMENT / PLAN:  Acute on Chronic Renal Failure  -creatinine not improving much. Baseline ~5. -Nephrology thinks she will need HD. Pt does not really want to do this.   Plan:   Cont BP control Lasix per nephrology  Probably will need HD  HTN Emergency  -No chest pain.  -off cardene Plan:  Cont tele Continue Norvasc, Hydralazine, Coreg &  catapres F/u ECHO  Trend Trop  Lasix per renal  Acute Hypoxic Respiratory Failure in setting of volume overload -->CXR low volume. No clear edema currently  -weaning oxygen Plan:   Maintain Oxygen >92 Trend CXR PRN Pulmonary Hygiene   Nausea --> ? Uremia  Protein Calorie Malnutrition  Plan:   Zofran PRN   Anemia of Chronic Diease: hgb a little worse. prob some component of dilution DVT Prophylaxis   Plan:  Trend CBC Heparin SQ  Maintain Hbg >7 Transfuse 1 unit   FAMILY  - Updates: family updated at bedside   - Inter-disciplinary family meet or Palliative Care meeting due by: 5/16  Discussion Acute on chronic renal failure. Uremia. Hypertensive emergency. BP better. A little nauseated-->suspect d/t uremia. OK to move to SDU. Will likely need HD.   Simonne MartinetPeter E Abdulloh Ullom ACNP-BC [facility] Pulmonary/Critical Care Pager # [phone] OR # [phone] if no answer

## 2016-09-28 NOTE — Progress Notes (Signed)
CRITICAL VALUE ALERT  Critical value received:  hgb 6.2  Date of notification:  09/28/16  Time of notification:  0830  Critical value read back: yes  Nurse who received alert:  Lbass  MD notified (1st page):  Anders SimmondsPete Babcock  Time of first page:  0830  MD notified (2nd page):  Time of second page:  Responding MD:  Gerda DissPete B  Time MD responded:  0830

## 2016-09-29 ENCOUNTER — Inpatient Hospital Stay (HOSPITAL_COMMUNITY): Payer: Medicaid - Out of State

## 2016-09-29 DIAGNOSIS — R0902 Hypoxemia: Secondary | ICD-10-CM

## 2016-09-29 DIAGNOSIS — N186 End stage renal disease: Secondary | ICD-10-CM

## 2016-09-29 DIAGNOSIS — H93233 Hyperacusis, bilateral: Secondary | ICD-10-CM

## 2016-09-29 LAB — BASIC METABOLIC PANEL
ANION GAP: 12 (ref 5–15)
BUN: 81 mg/dL — AB (ref 6–20)
CHLORIDE: 103 mmol/L (ref 101–111)
CO2: 22 mmol/L (ref 22–32)
Calcium: 8 mg/dL — ABNORMAL LOW (ref 8.9–10.3)
Creatinine, Ser: 9.72 mg/dL — ABNORMAL HIGH (ref 0.44–1.00)
GFR calc Af Amer: 5 mL/min — ABNORMAL LOW (ref >60)
GFR, EST NON AFRICAN AMERICAN: 5 mL/min — AB (ref >60)
GLUCOSE: 103 mg/dL — AB (ref 65–99)
POTASSIUM: 3.8 mmol/L (ref 3.5–5.1)
SODIUM: 137 mmol/L (ref 135–145)

## 2016-09-29 LAB — BPAM RBC
BLOOD PRODUCT EXPIRATION DATE: 201806122359
ISSUE DATE / TIME: 201805201319
UNIT TYPE AND RH: 7300

## 2016-09-29 LAB — TYPE AND SCREEN
ABO/RH(D): B POS
Antibody Screen: NEGATIVE
Unit division: 0

## 2016-09-29 LAB — GLUCOSE, CAPILLARY
Glucose-Capillary: 106 mg/dL — ABNORMAL HIGH (ref 65–99)
Glucose-Capillary: 98 mg/dL (ref 65–99)

## 2016-09-29 LAB — PROCALCITONIN: PROCALCITONIN: 0.84 ng/mL

## 2016-09-29 LAB — PHOSPHORUS: Phosphorus: 5.9 mg/dL — ABNORMAL HIGH (ref 2.5–4.6)

## 2016-09-29 LAB — HEMOGLOBIN AND HEMATOCRIT, BLOOD
HCT: 20.8 % — ABNORMAL LOW (ref 36.0–46.0)
Hemoglobin: 7.2 g/dL — ABNORMAL LOW (ref 12.0–15.0)

## 2016-09-29 LAB — MAGNESIUM: MAGNESIUM: 2 mg/dL (ref 1.7–2.4)

## 2016-09-29 MED ORDER — PANTOPRAZOLE SODIUM 40 MG PO TBEC
40.0000 mg | DELAYED_RELEASE_TABLET | Freq: Every day | ORAL | Status: DC
  Administered 2016-09-29 – 2016-09-30 (×2): 40 mg via ORAL
  Filled 2016-09-29 (×4): qty 1

## 2016-09-29 NOTE — Progress Notes (Signed)
Pt refusing lab draw.

## 2016-09-29 NOTE — CV Procedure (Signed)
Ms. Marcia Thornton had refused her echo on multiple occasions. She states she came into the hospital for her blood pressure related to her kidney disease and she doesn't need all this extra testing. Spoke to [place] her nurse to let her know patient wishes.   Leta Junglingiffany Helmer Dull [facility] 09/29/16 9:38 am

## 2016-09-29 NOTE — Progress Notes (Signed)
[facility] ROUNDING NOTE   Subjective:   Interval History: The patient is a 36 y.o. year-old w/ hx of severe longstanding HTN (4 BP medications) and CKD. She is followed by Dr Gates Rigg , nephrology in [place].  Per pt her baseline creat is around "5", we have no records.  Dr Miroski's phone numbers are [phone] (pgr) and [phone] (office).  She presented to [facility] with HA and uncont HTN last week on Monday 6 days ago, she was visiting her mother in [place]. They told her she had "fluid in her lungs", she got IV lasix and that improved.  Her BP was very high and that has improved somewhat, she was treated with Cardene drip initially.  Her creat is 9 this week , they proposed dialysis but patient was against the idea so she was transferred to [facility] Creatinine has improved to baseline and has consistently refused dialysis intervention  We shall respect her wishes and sign off today  Objective:  Vital signs in last 24 hours:  Temp:  [97.7 F (36.5 C)-99.1 F (37.3 C)] 97.7 F (36.5 C) (05/21 1141) Pulse Rate:  [84-97] 86 (05/21 0800) Resp:  [15-25] 20 (05/21 0800) BP: (129-160)/(84-109) 153/100 (05/21 0800) SpO2:  [88 %-99 %] 99 % (05/21 0800) FiO2 (%):  [13 %] 13 % (05/21 0400) Weight:  [172 lb 2.9 oz (78.1 kg)] 172 lb 2.9 oz (78.1 kg) (05/21 0500)  Weight change: 5 lb 15.2 oz (2.7 kg) Filed Weights   09/27/16 1949 09/28/16 0500 09/29/16 0500  Weight: 166 lb 3.6 oz (75.4 kg) 173 lb 1 oz (78.5 kg) 172 lb 2.9 oz (78.1 kg)    Intake/Output: I/O last 3 completed shifts: In: 2105 [P.O.:850; I.V.:590; Blood:365; IV Piggyback:300] Out: 1525 [Urine:1525]   Intake/Output this shift:  Total I/O In: 120 [P.O.:120] Out: -   CVS- RRR RS- CTA ABD- BS present soft non-distended EXT- no edema   Basic Metabolic Panel:  Recent Labs Lab 09/27/16 2012 09/28/16 0519 09/29/16 0559  NA 139 138 137  K 3.3* 3.5 3.8  CL 104 104 103  CO2 20* 22 22  GLUCOSE  104* 128* 103*  BUN 76* 77* 81*  CREATININE 9.75* 9.75* 9.72*  CALCIUM 8.3* 7.9* 8.0*  MG 2.2 2.1 2.0  PHOS 7.0* 7.3* 5.9*    Liver Function Tests:  Recent Labs Lab 09/27/16 2012  AST 16  ALT 19  ALKPHOS 63  BILITOT 0.8  PROT 6.4*  ALBUMIN 3.2*   No results for input(s): LIPASE, AMYLASE in the last 168 hours. No results for input(s): AMMONIA in the last 168 hours.  CBC:  Recent Labs Lab 09/27/16 2012 09/28/16 0519  WBC 14.1* 11.3*  HGB 7.6* 6.2*  HCT 21.6* 17.7*  MCV 80.3 81.2  PLT 212 195    Cardiac Enzymes:  Recent Labs Lab 09/27/16 2012  TROPONINI 0.08*    BNP: Invalid input(s): POCBNP  CBG:  Recent Labs Lab 09/28/16 1634 09/28/16 2051 09/29/16 0000 09/29/16 0429 09/29/16 0808  GLUCAP 125* 118* 134* 106* 98    Microbiology: Results for orders placed or performed during the hospital encounter of 09/27/16  MRSA PCR Screening     Status: None   Collection Time: 09/27/16  8:01 PM  Result Value Ref Range Status   MRSA by PCR NEGATIVE NEGATIVE Final    Comment:        The GeneXpert MRSA Assay (FDA approved for NASAL specimens only), is one component of a comprehensive MRSA colonization surveillance  program. It is not intended to diagnose MRSA infection nor to guide or monitor treatment for MRSA infections.     Coagulation Studies:  Recent Labs  09/28/16 0519  LABPROT 14.5  INR 1.12    Urinalysis: No results for input(s): COLORURINE, LABSPEC, PHURINE, GLUCOSEU, HGBUR, BILIRUBINUR, KETONESUR, PROTEINUR, UROBILINOGEN, NITRITE, LEUKOCYTESUR in the last 72 hours.  Invalid input(s): APPERANCEUR    Imaging: Dg Chest Port 1 View  Result Date: 09/27/2016 CLINICAL DATA:  Hypoxia EXAM: PORTABLE CHEST 1 VIEW COMPARISON:  None. FINDINGS: Cardiac shadow is mildly enlarged. The lungs are clear bilaterally. No focal infiltrate is seen. No bony abnormality is noted. IMPRESSION: No acute abnormality noted. Electronically Signed   By: Alcide CleverMark   Lukens M.D.   On: 09/27/2016 20:31     Medications:    . amLODipine  10 mg Oral Daily  . carvedilol  25 mg Oral BID WC  . cloNIDine  0.2 mg Oral TID  . furosemide  40 mg Oral BID  . heparin  5,000 Units Subcutaneous Q8H  . hydrALAZINE  50 mg Oral Q8H  . pantoprazole  40 mg Oral Q1200   acetaminophen, acetaminophen, labetalol, ondansetron (ZOFRAN) IV  Assessment/ Plan:  1  Acute on CKD IV - no old labs here, but per pt baseline creat is around 5.  This may be progression to ESRD, or acute on CKD.   patient consistently refuses dialysis will sign off  2  Vol overload/ hx pulm edema last week at outside facility - improved clinically  3  HTN - long standing, severe, on 4 oral BP meds now.  Will add po lasix.   4  Anemia due to CKD - transfuse prn, check fe/ tibc    LOS: 2 [place]ona Peake W @TODAY @11 :47 AM

## 2016-09-29 NOTE — Progress Notes (Signed)
Had a very frank conversation with patient about refusal of treatment and the Importance of working with the team at the hospital to be able to get better. Pt has refused so many lab draws that we do not ave an updated hmg since blood transfusion. Pt refused lab again until after RN spoke with her.   Pt had a very flat affect and does not feel like the doctors are taking time to talk with her. Nurse gave pt pen and paper to be able to write questions down for the morning. But pt telling nurse that she is okay with dialysis and understands the importance of this.

## 2016-09-29 NOTE — Progress Notes (Signed)
Pt complains of difficulty hearing in L ear states that it feels "full". Discussed with Dr Dolphus JennyMclung. [place] done [place] [facility]

## 2016-09-29 NOTE — Progress Notes (Signed)
[place] TEAM 1 - Stepdown/ICU TEAM  Faith Rogueiffany Skoog  ZOX:[id number] DOB: 10/17/1980 DOA: 09/27/2016 PCP: No primary care provider on file.    Brief Narrative:  36 year old female with CKD (unknown stage), HTN, and history of noncompliance with medications who was admitted to [facility] 09/23/16 for HTN Emergency. She was placed on a Cardene gtt with improvement in BP, and transferred to [facility] for possible HD.  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  She awakens briefly during my visit, and acknowledges that she is "ok."  There is no evidence of acute respiratory distress or uncontrolled pain.    Assessment & Plan:  Acute on CKD Baseline crt ~5 - Nephrology following - likely to require onset of HD but pt is reluctant to begin   Recent Labs Lab 09/27/16 2012 09/28/16 0519 09/29/16 0559  CREATININE 9.75* 9.75* 9.72*   Hypertensive Emergency BP better controlled, but not yet at goal - will likely remain difficult to control until HD initiated   Acute hypoxic respiratory failure due to pulmonary edema  Cont to require HFNC v/s Venturi mask to maintain adequate sats - will likely be difficult to manage until HD begun - watch in SDU due to concern for possible acute decline   New onset hearing loss B mastoid effusions - consult ENT if persists  Sellar lesion Noted on MRI brain  Anemia of chronic kidney disease F/u CBC pending   Nausea / Uremia  Not likely to resolve until HD initiated   DVT prophylaxis: SQ heparin Code Status: FULL CODE Family Communication: no family present at time of exam  Disposition Plan: transfer to SDU   Consultants:  Nephrology PCCM  Antimicrobials:   none  Objective: Blood pressure (!) 153/100, pulse 86, temperature 98.9 F (37.2 C), temperature source Oral, resp. rate 20, weight 78.1 kg (172 lb 2.9 oz), SpO2 99 %.  Intake/Output Summary (Last 24 hours) at 09/29/16 0849 Last data filed at 09/29/16 0800  Gross per 24 hour  Intake              855 ml  Output             1075 ml  Net             -220 ml   Filed Weights   09/27/16 1949 09/28/16 0500 09/29/16 0500  Weight: 75.4 kg (166 lb 3.6 oz) 78.5 kg (173 lb 1 oz) 78.1 kg (172 lb 2.9 oz)    Examination: General: No acute respiratory distress Lungs: Clear to auscultation bilaterally without wheezes or crackles Cardiovascular: Regular rate and rhythm without murmur gallop or rub normal S1 and S2 Abdomen: Nontender, nondistended, soft, bowel sounds positive, no rebound, no ascites, no appreciable mass Extremities: No significant cyanosis, clubbing, or edema bilateral lower extremities  CBC:  Recent Labs Lab 09/27/16 2012 09/28/16 0519  WBC 14.1* 11.3*  HGB 7.6* 6.2*  HCT 21.6* 17.7*  MCV 80.3 81.2  PLT 212 195   Basic Metabolic Panel:  Recent Labs Lab 09/27/16 2012 09/28/16 0519 09/29/16 0559  NA 139 138 137  K 3.3* 3.5 3.8  CL 104 104 103  CO2 20* 22 22  GLUCOSE 104*  128* 103*  BUN 76* 77* 81*  CREATININE 9.75* 9.75* 9.72*  CALCIUM 8.3* 7.9* 8.0*  MG 2.2 2.1 2.0  PHOS 7.0* 7.3* 5.9*   GFR: CrCl cannot be calculated (Unknown ideal weight.).  Liver Function Tests:  Recent Labs Lab 09/27/16 2012  AST 16  ALT 19  ALKPHOS 63  BILITOT 0.8  PROT 6.4*  ALBUMIN 3.2*    Coagulation Profile:  Recent Labs Lab 09/28/16 0519  INR 1.12    Cardiac Enzymes:  Recent Labs Lab 09/27/16 2012  TROPONINI 0.08*    CBG:  Recent Labs Lab 09/28/16 1634 09/28/16 2051 09/29/16 0000 09/29/16 0429 09/29/16 0808  GLUCAP 125* 118* 134* 106* 98    Recent Results (from the past 240 hour(s))  MRSA PCR Screening     Status: None   Collection Time:  09/27/16  8:01 PM  Result Value Ref Range Status   MRSA by PCR NEGATIVE NEGATIVE Final    Comment:        The GeneXpert MRSA Assay (FDA approved for NASAL specimens only), is one component of a comprehensive MRSA colonization surveillance program. It is not intended to diagnose MRSA infection nor to guide or monitor treatment for MRSA infections.      Scheduled Meds: . amLODipine  10 mg Oral Daily  . carvedilol  25 mg Oral BID WC  . cloNIDine  0.2 mg Oral TID  . furosemide  40 mg Oral BID  . heparin  5,000 Units Subcutaneous Q8H  . hydrALAZINE  50 mg Oral Q8H  . pantoprazole  40 mg Oral Q1200     LOS: 2 days   Lonia Blood, MD [facility] Office  [phone] Pager - Text Page per Loretha Stapler as per below:  On-Call/Text Page:      [email or web address]      password [email or web address]  If 7PM-7AM, please contact night-coverage [email or web address] Password [facility] 09/29/2016, 8:49 AM

## 2016-09-29 NOTE — Progress Notes (Signed)
Patient would like to speak to MD about dialysis options. MD paged. Awaiting call back.

## 2016-09-30 DIAGNOSIS — N185 Chronic kidney disease, stage 5: Secondary | ICD-10-CM

## 2016-09-30 DIAGNOSIS — I10 Essential (primary) hypertension: Secondary | ICD-10-CM

## 2016-09-30 DIAGNOSIS — Z9119 Patient's noncompliance with other medical treatment and regimen: Secondary | ICD-10-CM

## 2016-09-30 LAB — COMPREHENSIVE METABOLIC PANEL
ALT: 13 U/L — ABNORMAL LOW (ref 14–54)
ANION GAP: 12 (ref 5–15)
AST: 12 U/L — ABNORMAL LOW (ref 15–41)
Albumin: 2.5 g/dL — ABNORMAL LOW (ref 3.5–5.0)
Alkaline Phosphatase: 52 U/L (ref 38–126)
BILIRUBIN TOTAL: 0.5 mg/dL (ref 0.3–1.2)
BUN: 83 mg/dL — ABNORMAL HIGH (ref 6–20)
CHLORIDE: 101 mmol/L (ref 101–111)
CO2: 22 mmol/L (ref 22–32)
Calcium: 7.8 mg/dL — ABNORMAL LOW (ref 8.9–10.3)
Creatinine, Ser: 10.15 mg/dL — ABNORMAL HIGH (ref 0.44–1.00)
GFR, EST AFRICAN AMERICAN: 5 mL/min — AB (ref >60)
GFR, EST NON AFRICAN AMERICAN: 4 mL/min — AB (ref >60)
Glucose, Bld: 105 mg/dL — ABNORMAL HIGH (ref 65–99)
POTASSIUM: 3.5 mmol/L (ref 3.5–5.1)
Sodium: 135 mmol/L (ref 135–145)
TOTAL PROTEIN: 5.3 g/dL — AB (ref 6.5–8.1)

## 2016-09-30 LAB — CBC
HEMATOCRIT: 21.2 % — AB (ref 36.0–46.0)
Hemoglobin: 7.3 g/dL — ABNORMAL LOW (ref 12.0–15.0)
MCH: 28.7 pg (ref 26.0–34.0)
MCHC: 34.4 g/dL (ref 30.0–36.0)
MCV: 83.5 fL (ref 78.0–100.0)
PLATELETS: 231 10*3/uL (ref 150–400)
RBC: 2.54 MIL/uL — AB (ref 3.87–5.11)
RDW: 14.7 % (ref 11.5–15.5)
WBC: 10.2 10*3/uL (ref 4.0–10.5)

## 2016-09-30 NOTE — Progress Notes (Signed)
[facility] ROUNDING NOTE   Subjective:   Interval History: 36 year old female with past medical history of chronic kidney disease stage 5, hypertension and medical noncompliance was admitted from [facility] for hypertensive emergency. Subsequently she was [id number]. She initially refused dialysis although now she states she will do it   Objective:  Vital signs in last 24 hours:  Temp:  [97 F (36.1 C)-99.2 F (37.3 C)] 98.1 F (36.7 C) (05/22 0840) Pulse Rate:  [84-91] 88 (05/22 0843) Resp:  [16-23] 19 (05/22 0840) BP: (138-179)/(93-132) 179/109 (05/22 0949) SpO2:  [97 %-100 %] 98 % (05/22 0840) Weight:  [176 lb 5.9 oz (80 kg)] 176 lb 5.9 oz (80 kg) (05/22 0428)  Weight change: 4 lb 3 oz (1.9 kg) Filed Weights   09/28/16 0500 09/29/16 0500 09/30/16 0428  Weight: 173 lb 1 oz (78.5 kg) 172 lb 2.9 oz (78.1 kg) 176 lb 5.9 oz (80 kg)    Intake/Output: I/O last 3 completed shifts: In: 270 [P.O.:270] Out: 2050 [Urine:2050]   Intake/Output this shift:  Total I/O In: -  Out: 1000 [Urine:1000]  CVS- RRR RS- CTA ABD- BS present soft non-distended EXT- no edema   Basic Metabolic Panel:  Recent Labs Lab 09/27/16 2012 09/28/16 0519 09/29/16 0559 09/30/16 0245  NA 139 138 137 135  K 3.3* 3.5 3.8 3.5  CL 104 104 103 101  CO2 20* 22 22 22   GLUCOSE 104* 128* 103* 105*  BUN 76* 77* 81* 83*  CREATININE 9.75* 9.75* 9.72* 10.15*  CALCIUM 8.3* 7.9* 8.0* 7.8*  MG 2.2 2.1 2.0  --   PHOS 7.0* 7.3* 5.9*  --     Liver Function Tests:  Recent Labs Lab 09/27/16 2012 09/30/16 0245  AST 16 12*  ALT 19 13*  ALKPHOS 63 52  BILITOT 0.8 0.5  PROT 6.4* 5.3*  ALBUMIN 3.2* 2.5*   No results for input(s): LIPASE, AMYLASE in the last 168 hours. No results for input(s): AMMONIA in the last 168 hours.  CBC:  Recent Labs Lab 09/27/16 2012 09/28/16 0519 09/29/16 2035 09/30/16 0245  WBC 14.1* 11.3*  --  10.2  HGB 7.6* 6.2*  7.2* 7.3*  HCT 21.6* 17.7* 20.8* 21.2*  MCV 80.3 81.2  --  83.5  PLT 212 195  --  231    Cardiac Enzymes:  Recent Labs Lab 09/27/16 2012  TROPONINI 0.08*    BNP: Invalid input(s): POCBNP  CBG:  Recent Labs Lab 09/28/16 1634 09/28/16 2051 09/29/16 0000 09/29/16 0429 09/29/16 0808  GLUCAP 125* 118* 134* 106* 98    Microbiology: Results for orders placed or performed during the hospital encounter of 09/27/16  MRSA PCR Screening     Status: None   Collection Time: 09/27/16  8:01 PM  Result Value Ref Range Status   MRSA by PCR NEGATIVE NEGATIVE Final    Comment:        The GeneXpert MRSA Assay (FDA approved for NASAL specimens only), is one component of a comprehensive MRSA colonization surveillance program. It is not intended to diagnose MRSA infection nor to guide or monitor treatment for MRSA infections.     Coagulation Studies:  Recent Labs  09/28/16 0519  LABPROT 14.5  INR 1.12    Urinalysis: No results for input(s): COLORURINE, LABSPEC, PHURINE, GLUCOSEU, HGBUR, BILIRUBINUR, KETONESUR, PROTEINUR, UROBILINOGEN, NITRITE, LEUKOCYTESUR in the last 72 hours.  Invalid input(s): APPERANCEUR    Imaging: No results found.   Medications:    . amLODipine  10 mg Oral Daily  . carvedilol  25 mg Oral BID WC  . cloNIDine  0.2 mg Oral TID  . furosemide  40 mg Oral BID  . heparin  5,000 Units Subcutaneous Q8H  . hydrALAZINE  50 mg Oral Q8H  . pantoprazole  40 mg Oral Q1200   acetaminophen, acetaminophen, labetalol, ondansetron (ZOFRAN) IV  Assessment/ Plan:  1 Acute on CKD IV - no old labs here, but per pt baseline creat is around 5. Discuss[place]  He states that she is non compliant with follow up and has refused blood work . He states he has only seen her in the office a couple of times. She has refused dialysis here however when I signed off, she told her hospitalists that she would do dialysis. This morning she  is ambivalent again.We will obtain vein mapping and catheter placement. I have advised that we CLIP patient  2 Vol overload/ hx pulm edema last week at outside facility - improved clinically  3 HTN - long standing, severe, on 4 oral BP meds now.  4 Anemia due to CKD - transfus     LOS: 3 Marcia Thornton W @TODAY @2 :40 PM

## 2016-09-30 NOTE — Care Management Note (Signed)
Case Management Note  Patient Details  Name: Marcia Thornton MRN: [id number] Date of Birth: 10/25/1980  Subjective/Objective:  [place] was [place] stage V admitted with hypertensive emergency, CR 9.7, plan for placement of tunneled catheter and permanent access. Has no insurance.   NO PCP listed               Action/Plan: NCM will follow for dc needs.  Expected Discharge Date:                  Expected Discharge Plan:  Home/Self Care  In-House Referral:     Discharge planning Services  CM Consult  Post Acute Care Choice:    Choice offered to:     DME Arranged:    DME Agency:     HH Arranged:    HH Agency:     Status of Service:  In process, will continue to follow  If discussed at Long Length of Stay Meetings, dates discussed:    Additional Comments:  Leone Havenaylor, Luann Aspinwall Clinton, RN 09/30/2016, 5:14 PM

## 2016-09-30 NOTE — Consult Note (Signed)
VASCULAR SURGERY ASSESSMENT & PLAN:   We have been consulted for placement of a tunneled dialysis catheter and AV access. Her vein mapping is pending. She has IVs in both arms. Pending the results of her vein map we will remove the IV from one of her arms in anticipation of access. We will try to get her on the schedule for Thursday or Friday.  Waverly Ferrarihristopher Dickson, MD, FACS Beeper [phone] Office: [phone]    History of Present Illness:  Patient is a 36 y.o. year old female who was admitted secondary to hypertensive emergency.  We have been asked to place tunneled IJ catheter and  permanent hemodialysis access. The patient is right handed. The patient is not currently on hemodialysis.  The cause of renal failure is thought to be secondary to uncontrolled hypertention.  Other chronic medical problems include CKD stage V non compliance with HTN controll.  She lives in [place] but was visiting family and [place].   Past Medical History:  Diagnosis Date  . Chronic kidney disease   . Hypertension     No past surgical history on file.   Social History Social History  Substance Use Topics  . Smoking status: Not on file  . Smokeless tobacco: Not on file  . Alcohol use Not on file    Family History No family history on file.  Allergies  No Known Allergies   Current Facility-Administered Medications  Medication Dose Route Frequency Provider Last Rate Last Dose  . acetaminophen (TYLENOL) suppository 650 mg  650 mg Rectal Q6H PRN Tobey GrimEubanks, Katalina M, NP      . acetaminophen (TYLENOL) tablet 650 mg  650 mg Oral Q6H PRN Karl ItoSommer, Steven E, MD   650 mg at 09/30/16 1315  . amLODipine (NORVASC) tablet 10 mg  10 mg Oral Daily Tobey GrimEubanks, Katalina M, NP   10 mg at 09/30/16 0949  . carvedilol (COREG) tablet 25 mg  25 mg Oral BID WC Tobey GrimEubanks, Katalina M, NP   25 mg at 09/30/16 0843  . cloNIDine (CATAPRES) tablet 0.2 mg  0.2 mg Oral TID Oretha MilchAlva, Rakesh V, MD   0.2 mg at 09/30/16 0949  .  furosemide (LASIX) tablet 40 mg  40 mg Oral BID Delano MetzSchertz, Robert, MD   40 mg at 09/30/16 0843  . heparin injection 5,000 Units  5,000 Units Subcutaneous Q8H Jovita KussmaulEubanks, Katalina M, NP      . hydrALAZINE (APRESOLINE) tablet 50 mg  50 mg Oral Q8H Tobey GrimEubanks, Katalina M, NP   50 mg at 09/30/16 0644  . labetalol (NORMODYNE,TRANDATE) injection 10-20 mg  10-20 mg Intravenous Q2H PRN Oretha MilchAlva, Rakesh V, MD   10 mg at 09/28/16 1029  . ondansetron (ZOFRAN) injection 4 mg  4 mg Intravenous Q6H PRN Tobey GrimEubanks, Katalina M, NP   4 mg at 09/30/16 0949  . pantoprazole (PROTONIX) EC tablet 40 mg  40 mg Oral Q1200 Lonia BloodMcClung, Jeffrey T, MD   40 mg at 09/30/16 1318    ROS:   General:  No weight loss, Fever, chills  HEENT: positve recent headaches, no nasal bleeding, blurred visual changes, no sore throat, nausea  Neurologic: No dizziness, blackouts, seizures. No recent symptoms of stroke or mini- stroke. No recent episodes of slurred speech, or temporary blindness.  Cardiac: No recent episodes of chest pain/pressure, no shortness of breath at rest.  No shortness of breath with exertion.  Denies history of atrial fibrillation or irregular heartbeat  Vascular: No history of rest pain in feet.  No  history of claudication.  No history of non-healing ulcer, No history of DVT   Pulmonary: No home oxygen, no productive cough, no hemoptysis,  No asthma or wheezing  Musculoskeletal:  [ ]  Arthritis, [ ]  Low back pain,  [ ]  Joint pain  Hematologic:No history of hypercoagulable state.  No history of easy bleeding.  No history of anemia  Gastrointestinal: No hematochezia or melena,  No gastroesophageal reflux, no trouble swallowing  Urinary: [ ]  chronic Kidney disease, [ ]  on HD - [ ]  MWF or [ ]  TTHS, [ ]  Burning with urination, [ ]  Frequent urination, [ ]  Difficulty urinating;   Skin: No rashes  Psychological: No history of anxiety,  No history of depression   Physical Examination  Vitals:   09/30/16 0644 09/30/16 0840  09/30/16 0843 09/30/16 0949  BP: (!) 148/98 (!) 161/112 (!) 161/112 (!) 179/109  Pulse:  88 88   Resp:  19    Temp:  98.1 F (36.7 C)    TempSrc:  Oral    SpO2:  98%    Weight:        There is no height or weight on file to calculate BMI.  General:  Alert and oriented, no acute distress HEENT: Normal Neck: No bruit or JVD Pulmonary: Clear to auscultation bilaterally Cardiac: Regular Rate and Rhythm without murmur Gastrointestinal: Soft, non-tender, non-distended, no mass, no scars Skin: No rash Extremity Pulses:  2+ radial, brachial pulses bilaterally Musculoskeletal: No deformity or edema  Neurologic: Upper and lower extremity motor 5/5 and symmetric  DATA:  Pending vein mapping   ASSESSMENT:  CKD stage V admitted with hypertensive emergency Cr 9.7   PLAN: Placement of tunneled catheter and permanent access.  She is right hand dominant.    COLLINS, EMMA MAUREEN PA-C [facility]

## 2016-09-30 NOTE — Progress Notes (Signed)
PROGRESS NOTE    Marcia Thornton  ZOX:[id number] DOB: 10/06/1980 DOA: 09/27/2016 PCP: No primary care provider on file.   Brief Narrative:   36 year old female with past medical history of chronic kidney disease, hypertension and medical noncompliance was admitted from [facility] for hypertensive emergency. Subsequently she was transferred to: 4 dialysis  Assessment & Plan:   Active Problems:   Hypertensive emergency  Acute on chronic CK D; unknown stage previously -Currently she is agreeable for dialysis. Nephrology following -She will require dialysis access. Preference deferred to nephrology -Potassium is normal at this time. But her creatinine continues to rise along with BUN -Closely monitor urine output  BMP Latest Ref Rng & Units 09/30/2016 09/29/2016 09/28/2016  Glucose 65 - 99 mg/dL 811(B) 147(W) 295(A)  BUN 6 - 20 mg/dL 21(H) 08(M) 57(Q)  Creatinine 0.44 - 1.00 mg/dL 46.96(E) 9.52(W) 4.13(K)  Sodium 135 - 145 mmol/L 135 137 138  Potassium 3.5 - 5.1 mmol/L 3.5 3.8 3.5  Chloride 101 - 111 mmol/L 101 103 104  CO2 22 - 32 mmol/L 22 22 22   Calcium 8.9 - 10.3 mg/dL 7.8(L) 8.0(L) 7.9(L)    Hypertensive emergency -Blood pressure remains elevated but it has improved -She will need dialysis  Acute hypoxic respiratory failure secondary to pulmonary edema -Currently has improved. Supplemental oxygen as needed -Dialysis should help her  Anemia chronic kidney disease -Continue to monitor hemoglobin -Iron studies done on 09/28/2016 CBC Latest Ref Rng & Units 09/30/2016 09/29/2016 09/28/2016  WBC 4.0 - 10.5 K/uL 10.2 - 11.3(H)  Hemoglobin 12.0 - 15.0 g/dL 7.3(L) 7.2(L) 6.2(LL)  Hematocrit 36.0 - 46.0 % 21.2(L) 20.8(L) 17.7(L)  Platelets 150 - 400 K/uL 231 - 195   New onset hearing loss?/Bilateral mastoid effusion and sellar lesion -Seen on MRI. Consult ENT inpatient of needed otherwise follow-up outpatient  Medical noncompliance -Spoke with her extensively this morning in  terms of importance of medical compliance including the need for dialysis. Explained her Refusing dialysis can lead to worsening of renal function and electrolyte imbalance which can ultimately lead to death  Hypertension -Continue amlodipine, Coreg, clonidine, Lasix and hydralazine -Should be able to adjust more medications once she gets started on dialysis and see how she does  DVT prophylaxis: SCDs Code Status: Full Family Communication:  Patient comprehends well Disposition Plan: Maintain stepdown at this time  Consultants:   Nephrology  Procedures:   None  Antimicrobials:   None    Subjective: Patient does not have any active complaints this morning. She is agreeable get on dialysis to help her renal function. After speaking with her extensively the need for dialysis she understands its importance.  Objective: Vitals:   09/30/16 0644 09/30/16 0840 09/30/16 0843 09/30/16 0949  BP: (!) 148/98 (!) 161/112 (!) 161/112 (!) 179/109  Pulse:  88 88   Resp:  19    Temp:  98.1 F (36.7 C)    TempSrc:  Oral    SpO2:  98%    Weight:        Intake/Output Summary (Last 24 hours) at 09/30/16 1027 Last data filed at 09/30/16 0210  Gross per 24 hour  Intake              150 ml  Output             1375 ml  Net            -1225 ml   Filed Weights   09/28/16 0500 09/29/16 0500 09/30/16 0428  Weight: 78.5  kg (173 lb 1 oz) 78.1 kg (172 lb 2.9 oz) 80 kg (176 lb 5.9 oz)    Examination:  General exam: Appears calm and comfortable  Respiratory system: Clear to auscultation. Respiratory effort normal. Cardiovascular system: S1 & S2 heard, RRR. No JVD, murmurs, rubs, gallops or clicks. No pedal edema. Gastrointestinal system: Abdomen is nondistended, soft and nontender. No organomegaly or masses felt. Normal bowel sounds heard. Central nervous system: Alert and oriented. No focal neurological deficits. Extremities: Symmetric 5 x 5 power. Skin: No rashes, lesions or  ulcers Psychiatry: Judgement and insight appear normal. Mood & affect appropriate.     Data Reviewed:   CBC:  Recent Labs Lab 09/27/16 2012 09/28/16 0519 09/29/16 2035 09/30/16 0245  WBC 14.1* 11.3*  --  10.2  HGB 7.6* 6.2* 7.2* 7.3*  HCT 21.6* 17.7* 20.8* 21.2*  MCV 80.3 81.2  --  83.5  PLT 212 195  --  231   Basic Metabolic Panel:  Recent Labs Lab 09/27/16 2012 09/28/16 0519 09/29/16 0559 09/30/16 0245  NA 139 138 137 135  K 3.3* 3.5 3.8 3.5  CL 104 104 103 101  CO2 20* 22 22 22   GLUCOSE 104* 128* 103* 105*  BUN 76* 77* 81* 83*  CREATININE 9.75* 9.75* 9.72* 10.15*  CALCIUM 8.3* 7.9* 8.0* 7.8*  MG 2.2 2.1 2.0  --   PHOS 7.0* 7.3* 5.9*  --    GFR: CrCl cannot be calculated (Unknown ideal weight.). Liver Function Tests:  Recent Labs Lab 09/27/16 2012 09/30/16 0245  AST 16 12*  ALT 19 13*  ALKPHOS 63 52  BILITOT 0.8 0.5  PROT 6.4* 5.3*  ALBUMIN 3.2* 2.5*   No results for input(s): LIPASE, AMYLASE in the last 168 hours. No results for input(s): AMMONIA in the last 168 hours. Coagulation Profile:  Recent Labs Lab 09/28/16 0519  INR 1.12   Cardiac Enzymes:  Recent Labs Lab 09/27/16 2012  TROPONINI 0.08*   BNP (last 3 results) No results for input(s): PROBNP in the last 8760 hours. HbA1C: No results for input(s): HGBA1C in the last 72 hours. CBG:  Recent Labs Lab 09/28/16 1634 09/28/16 2051 09/29/16 0000 09/29/16 0429 09/29/16 0808  GLUCAP 125* 118* 134* 106* 98   Lipid Profile: No results for input(s): CHOL, HDL, LDLCALC, TRIG, CHOLHDL, LDLDIRECT in the last 72 hours. Thyroid Function Tests: No results for input(s): TSH, T4TOTAL, FREET4, T3FREE, THYROIDAB in the last 72 hours. Anemia Panel:  Recent Labs  09/28/16 1041  TIBC 256  IRON 32   Sepsis Labs:  Recent Labs Lab 09/27/16 2012 09/27/16 2017 09/27/16 2345 09/28/16 0519 09/29/16 0559  PROCALCITON 0.73  --   --  0.86 0.84  LATICACIDVEN  --  0.6 0.6  --   --      Recent Results (from the past 240 hour(s))  MRSA PCR Screening     Status: None   Collection Time: 09/27/16  8:01 PM  Result Value Ref Range Status   MRSA by PCR NEGATIVE NEGATIVE Final    Comment:        The GeneXpert MRSA Assay (FDA approved for NASAL specimens only), is one component of a comprehensive MRSA colonization surveillance program. It is not intended to diagnose MRSA infection nor to guide or monitor treatment for MRSA infections.          Radiology Studies: No results found.      Scheduled Meds: . amLODipine  10 mg Oral Daily  . carvedilol  25 mg  Oral BID WC  . cloNIDine  0.2 mg Oral TID  . furosemide  40 mg Oral BID  . heparin  5,000 Units Subcutaneous Q8H  . hydrALAZINE  50 mg Oral Q8H  . pantoprazole  40 mg Oral Q1200   Continuous Infusions:   LOS: 3 days    Time spent: 35 mins     Riata Ikeda Joline Maxcyhirag Sergio Zawislak, MD [facility] Pager [phone]   If 7PM-7AM, please contact night-coverage [email or web address] Password [email or web address] 09/30/2016, 10:27 AM

## 2016-10-01 ENCOUNTER — Inpatient Hospital Stay (HOSPITAL_COMMUNITY): Payer: Medicaid - Out of State

## 2016-10-01 DIAGNOSIS — N185 Chronic kidney disease, stage 5: Secondary | ICD-10-CM

## 2016-10-01 DIAGNOSIS — N189 Chronic kidney disease, unspecified: Secondary | ICD-10-CM

## 2016-10-01 LAB — HEPATITIS B SURFACE ANTIGEN: Hepatitis B Surface Ag: NEGATIVE

## 2016-10-01 LAB — TROPONIN I
TROPONIN I: 0.03 ng/mL — AB (ref <0.03)
Troponin I: 0.03 ng/mL (ref <0.03)
Troponin I: <0.03 ng/mL (ref <0.03)

## 2016-10-01 LAB — FERRITIN: FERRITIN: 93 ng/mL (ref 11–307)

## 2016-10-01 LAB — HEPATITIS B SURFACE ANTIBODY,QUALITATIVE: Hep B S Ab: NONREACTIVE

## 2016-10-01 LAB — HEPATITIS B CORE ANTIBODY, TOTAL: HEP B C TOTAL AB: NEGATIVE

## 2016-10-01 MED ORDER — DARBEPOETIN ALFA 150 MCG/0.3ML IJ SOSY
150.0000 ug | PREFILLED_SYRINGE | INTRAMUSCULAR | Status: DC
  Filled 2016-10-01: qty 0.3

## 2016-10-01 MED ORDER — LORAZEPAM 1 MG PO TABS
1.0000 mg | ORAL_TABLET | Freq: Three times a day (TID) | ORAL | Status: DC | PRN
  Administered 2016-10-07: 1 mg via ORAL
  Filled 2016-10-01: qty 1

## 2016-10-01 MED ORDER — ONDANSETRON HCL 4 MG/2ML IJ SOLN
4.0000 mg | Freq: Once | INTRAMUSCULAR | Status: AC
  Filled 2016-10-01: qty 2

## 2016-10-01 MED ORDER — NITROGLYCERIN 0.4 MG SL SUBL
0.4000 mg | SUBLINGUAL_TABLET | SUBLINGUAL | Status: DC | PRN
  Administered 2016-10-01 (×3): 0.4 mg via SUBLINGUAL

## 2016-10-01 MED ORDER — HYDRALAZINE HCL 20 MG/ML IJ SOLN
20.0000 mg | Freq: Four times a day (QID) | INTRAMUSCULAR | Status: DC | PRN
  Administered 2016-10-02 – 2016-10-06 (×6): 20 mg via INTRAVENOUS
  Filled 2016-10-01 (×7): qty 1

## 2016-10-01 MED ORDER — ONDANSETRON HCL 4 MG/2ML IJ SOLN
4.0000 mg | Freq: Once | INTRAMUSCULAR | Status: AC
  Administered 2016-10-01: 4 mg via INTRAVENOUS

## 2016-10-01 MED ORDER — PANTOPRAZOLE SODIUM 40 MG IV SOLR
40.0000 mg | INTRAVENOUS | Status: DC
  Administered 2016-10-01 – 2016-10-02 (×2): 40 mg via INTRAVENOUS
  Filled 2016-10-01 (×2): qty 40

## 2016-10-01 MED ORDER — NITROGLYCERIN 0.4 MG SL SUBL
SUBLINGUAL_TABLET | SUBLINGUAL | Status: AC
  Filled 2016-10-01: qty 1

## 2016-10-01 NOTE — Progress Notes (Addendum)
PROGRESS NOTE    Marcia Thornton  ZOX:[id number] DOB: 06/22/1980 DOA: 09/27/2016 PCP: No primary care provider on file.   Brief Narrative:   36 year old female with past medical history of chronic kidney disease, hypertension and medical noncompliance was [facility]. Subsequently she was transferred for possible HD and hypertensive urgency.   Assessment & Plan:   Active Problems:   Hypertensive emergency  Acute on chronic CKD; unknown stage previously -Currently she is agreeable for dialysis. Nephrology following -She will require dialysis access. Preference deferred to nephrology -Potassium is normal at this time. But her creatinine continues to rise along with BUN -Closely monitor urine output. - vascular consulted by nephrology for vein mapping and for av fistula placement.   Hypertensive emergency -Blood pressure remains elevated but it has improved. Resume lasix, clonidine, coreg, norvasc, labetalol and hydralazine.  -She will need dialysis, pt agreeable.   Acute hypoxic respiratory failure secondary to pulmonary edema -Currently has improved. Supplemental oxygen as needed -Dialysis should help her  Anemia chronic kidney disease -Continue to monitor hemoglobin -Iron studies done on 09/28/2016 - hemoglobin around 7. Transfuse if less than 7.   Medical noncompliance -Spoke with her extensively this morning in terms of importance of medical compliance including the need for dialysis. Explained her Refusing dialysis can lead to worsening of renal function and electrolyte imbalance which can ultimately lead to death. She is agreeable to HD today.   Chest pain: substernal non radiating, occurred at rest, EKG shows normal sinus rhythm, prolonged QT, t wave abnormality and lateral ischemia. troponins sent, ECHO, and repeat EKG ordered.  Will request cardiology consultation if echo abnormal.      DVT prophylaxis: heparin.  Code  Status: Full Family Communication:  Family at bedside.  Disposition Plan: Maintain stepdown at this time  Consultants:   Nephrology  Procedures:   None  Antimicrobials:   None    Subjective: Reported chest pain earlier today, resolved with ntg and ativan.  Currently chest pain free,  One episode of vomiting.   Objective: Vitals:   10/01/16 0930 10/01/16 0940 10/01/16 0943 10/01/16 1134  BP: (!) 169/135 (!) 174/118 (!) 163/110 (!) 150/100  Pulse: 89 87  80  Resp: 20 (!) 21  18  Temp:    97.7 F (36.5 C)  TempSrc:    Axillary  SpO2: 100% 93%  97%  Weight:        Intake/Output Summary (Last 24 hours) at 10/01/16 1443 Last data filed at 10/01/16 0900  Gross per 24 hour  Intake              190 ml  Output             1500 ml  Net            -1310 ml   Filed Weights   09/28/16 0500 09/29/16 0500 09/30/16 0428  Weight: 78.5 kg (173 lb 1 oz) 78.1 kg (172 lb 2.9 oz) 80 kg (176 lb 5.9 oz)    Examination:  General exam: Appears in mild distress from nausea.  Respiratory system: Clear to auscultation. Respiratory effort normal. Cardiovascular system: S1 & S2 heard, RRR. No JVD, murmurs, rubs, gallops or clicks. No pedal edema. Gastrointestinal system: Abdomen is nondistended, soft and nontender. No organomegaly or masses felt. Normal bowel sounds heard. Central nervous system: Alert and oriented. No focal neurological deficits. Extremities: Symmetric 5 x 5 power. Skin: No rashes, lesions or ulcers     Data  Reviewed:   CBC:  Recent Labs Lab 09/27/16 2012 09/28/16 0519 09/29/16 2035 09/30/16 0245  WBC 14.1* 11.3*  --  10.2  HGB 7.6* 6.2* 7.2* 7.3*  HCT 21.6* 17.7* 20.8* 21.2*  MCV 80.3 81.2  --  83.5  PLT 212 195  --  231   Basic Metabolic Panel:  Recent Labs Lab 09/27/16 2012 09/28/16 0519 09/29/16 0559 09/30/16 0245  NA 139 138 137 135  K 3.3* 3.5 3.8 3.5  CL 104 104 103 101  CO2 20* 22 22 22   GLUCOSE 104* 128* 103* 105*  BUN 76* 77* 81*  83*  CREATININE 9.75* 9.75* 9.72* 10.15*  CALCIUM 8.3* 7.9* 8.0* 7.8*  MG 2.2 2.1 2.0  --   PHOS 7.0* 7.3* 5.9*  --    GFR: CrCl cannot be calculated (Unknown ideal weight.). Liver Function Tests:  Recent Labs Lab 09/27/16 2012 09/30/16 0245  AST 16 12*  ALT 19 13*  ALKPHOS 63 52  BILITOT 0.8 0.5  PROT 6.4* 5.3*  ALBUMIN 3.2* 2.5*   No results for input(s): LIPASE, AMYLASE in the last 168 hours. No results for input(s): AMMONIA in the last 168 hours. Coagulation Profile:  Recent Labs Lab 09/28/16 0519  INR 1.12   Cardiac Enzymes:  Recent Labs Lab 09/27/16 2012 10/01/16 1031  TROPONINI 0.08* 0.03*   BNP (last 3 results) No results for input(s): PROBNP in the last 8760 hours. HbA1C: No results for input(s): HGBA1C in the last 72 hours. CBG:  Recent Labs Lab 09/28/16 1634 09/28/16 2051 09/29/16 0000 09/29/16 0429 09/29/16 0808  GLUCAP 125* 118* 134* 106* 98   Lipid Profile: No results for input(s): CHOL, HDL, LDLCALC, TRIG, CHOLHDL, LDLDIRECT in the last 72 hours. Thyroid Function Tests: No results for input(s): TSH, T4TOTAL, FREET4, T3FREE, THYROIDAB in the last 72 hours. Anemia Panel: No results for input(s): VITAMINB12, FOLATE, FERRITIN, TIBC, IRON, RETICCTPCT in the last 72 hours. Sepsis Labs:  Recent Labs Lab 09/27/16 2012 09/27/16 2017 09/27/16 2345 09/28/16 0519 09/29/16 0559  PROCALCITON 0.73  --   --  0.86 0.84  LATICACIDVEN  --  0.6 0.6  --   --     Recent Results (from the past 240 hour(s))  MRSA PCR Screening     Status: None   Collection Time: 09/27/16  8:01 PM  Result Value Ref Range Status   MRSA by PCR NEGATIVE NEGATIVE Final    Comment:        The GeneXpert MRSA Assay (FDA approved for NASAL specimens only), is one component of a comprehensive MRSA colonization surveillance program. It is not intended to diagnose MRSA infection nor to guide or monitor treatment for MRSA infections.          Radiology  Studies: No results found.      Scheduled Meds: . amLODipine  10 mg Oral Daily  . carvedilol  25 mg Oral BID WC  . cloNIDine  0.2 mg Oral TID  . darbepoetin (ARANESP) injection - DIALYSIS  150 mcg Intravenous Q Wed-HD  . furosemide  40 mg Oral BID  . heparin  5,000 Units Subcutaneous Q8H  . hydrALAZINE  50 mg Oral Q8H  . nitroGLYCERIN      . pantoprazole  40 mg Oral Q1200   Continuous Infusions:   LOS: 4 days    Time spent: 35 mins     Vinnie Gombert, MD [facility] Pager [phone]  If 7PM-7AM, please contact night-coverage [email or web address] Password [facility] 10/01/2016, 2:43 PM

## 2016-10-01 NOTE — Progress Notes (Signed)
Right  Upper Extremity Vein Map   Cephalic  Segment Diameter Depth Comment  1. Axilla 2.3 mm 8.6 mm   2. Mid upper arm 2 mm 3.9 mm   3. Above AC 0.1 mm 3.6 mm   4. In AC 1.7 mm 4.6 mm   5. Below AC 3.6 mm 4.6 mm   6. Mid forearm 3.1 mm 2.8 mm Branch  7. Wrist 2.2 mm 4.3 mm    Basilic  Segment Diameter Depth Comment  1. Axilla 3.2 mm 9.3 mm   2. Mid upper arm 3 mm 7.4 mm   3. Above AC 1.4 mm 5.5 mm   4. In AC 1.2 mm 4.8 mm   5. Below AC 2 mm 2.9 mm   6. Mid forearm 1.3 mm 2.2 mm   7. Wrist 1.6 mm 3.6 mm     Left Upper Extremity Vein Map  Cephalic  Segment Diameter Depth Comment  1. Axilla 2.8 mm 6.6 mm   2. Mid upper arm 1.8 mm 3.7 mm   3. Above AC 1.6 mm 3.5 mm   4. In AC 3 mm 3 mm   5. Below AC 2.6 mm 4 mm   6. Mid forearm 2.1 mm 3.2 mm Thrombosis  7. Wrist 2 mm 4.2 mm    Basilic  Segment Diameter Depth Comment  1. Axilla 3.4 mm 11.7 mm   2. Mid upper arm 3.1 mm 10 mm   3. Above AC 2 mm 9.2 mm   4. In AC 2.1 mm 8.7 mm   5. Below AC 1.6 mm 2.6 mm   6. Mid forearm 1.6 mm 2.8 mm   7. Wrist 1.4 mm 3.5 mm

## 2016-10-01 NOTE — Progress Notes (Signed)
12 Lead EKG done DR Hyman HopesWebb reviewed results DR Blake DivineAkula called to make her aware that pt is having chest pain 2nd NTG given 0.4 SL

## 2016-10-01 NOTE — Progress Notes (Signed)
Pt given 3rd NTG BP still elevated - chest pain down from original "8" to a "6" to possibly a "5" on 02 at 2 L n/c no c/o SOB  Boyfriend at bedside holding her hand - keeping eyes closed said - " why do u keep asking me how & where it hurts" ? Explained to pt we need a way to track effectiveness and relief using NTG SL. Was unable to give AM meds due to nausea - given Zofran IV Pt could not eat her breakfast - had some water

## 2016-10-01 NOTE — Progress Notes (Signed)
Pt unable to take her BP meds po - given PRN dose of Labetalol IV

## 2016-10-01 NOTE — Progress Notes (Signed)
   VASCULAR SURGERY ASSESSMENT & PLAN:   I have reviewed her vein map. The results are discussed below. Based on this, I think her best option for fistula would be a right radiocephalic fistula or a right basilic vein transposition. Her surgery is scheduled for Friday. We'll also place a tunneled dialysis catheter at that time. I will have her IV in the right arm removed.   SUBJECTIVE:   The patient was sleeping when I saw her this morning.  PHYSICAL EXAM:   Vitals:   10/01/16 0930 10/01/16 0940 10/01/16 0943 10/01/16 1134  BP: (!) 169/135 (!) 174/118 (!) 163/110 (!) 150/100  Pulse: 89 87  80  Resp: 20 (!) 21  18  Temp:    97.7 F (36.5 C)  TempSrc:    Axillary  SpO2: 100% 93%  97%  Weight:       She has a palpable right radial pulse.  LABS:   VEIN MAP: I reviewed the patient's vein map. On the left side the forearm and upper arm cephalic vein looks small and is likely not usable. The basilic vein on the left is also small. On the right side the forearm cephalic vein might potentially be usable for fistula. The basilic vein on the right looks reasonable in size. I suspect that the forearm cephalic vein empties into the basilic system on the right as the upper arm cephalic vein is very small.   Lab Results  Component Value Date   WBC 10.2 09/30/2016   HGB 7.3 (L) 09/30/2016   HCT 21.2 (L) 09/30/2016   MCV 83.5 09/30/2016   PLT 231 09/30/2016   Lab Results  Component Value Date   CREATININE 10.15 (H) 09/30/2016   Lab Results  Component Value Date   INR 1.12 09/28/2016   CBG (last 3)   Recent Labs  09/29/16 0000 09/29/16 0429 09/29/16 0808  GLUCAP 134* 106* 98    PROBLEM LIST:    Active Problems:   Hypertensive emergency   CURRENT MEDS:   . amLODipine  10 mg Oral Daily  . carvedilol  25 mg Oral BID WC  . cloNIDine  0.2 mg Oral TID  . darbepoetin (ARANESP) injection - DIALYSIS  150 mcg Intravenous Q Wed-HD  . furosemide  40 mg Oral BID  . heparin   5,000 Units Subcutaneous Q8H  . hydrALAZINE  50 mg Oral Q8H  . nitroGLYCERIN      . pantoprazole  40 mg Oral Q1200    Cari CarawayChris Stephaie Dardis Beeper: [phone] Office: [phone] 10/01/2016

## 2016-10-01 NOTE — Progress Notes (Signed)
Repeat EKG done - text paged  MD to make aware

## 2016-10-01 NOTE — Progress Notes (Signed)
Pt c/o headache - asked for tylenol po than threw it up less than 10 " later Pt had tried to eat 5 bites of lunch & sips of Ginger ale

## 2016-10-01 NOTE — Progress Notes (Signed)
Pt refused her Heparin SQ - explained what it is for & benefits of Heparin. Pt refused again.

## 2016-10-01 NOTE — Progress Notes (Signed)
Text page DR  Blake DivineAkula to make her aware that pt is unable to take most of he PO meds today due to nausea and vomiting

## 2016-10-01 NOTE — Progress Notes (Addendum)
Pt called said she has chest pain - on scale of 1 to 10 rated an 8 tightness in chest BP 174/118 HR SR at 89 ordered stat EKG  And Ntg given 0.4 mg SL Nephrologist here aware of pts chest pain Pt already has 02 on

## 2016-10-01 NOTE — Progress Notes (Signed)
[facility] ROUNDING NOTE   Subjective:   Interval History:36 year old female with past medical history of chronic kidney disease stage 5, hypertension and medical noncompliance was admitted from [facility] for hypertensive emergency. Subsequently she was [place]. She initially refused dialysis although now she states she will do it, She is planned for surgery this week   Objective:  Vital signs in last 24 hours:  Temp:  [97.7 F (36.5 C)-98.2 F (36.8 C)] 98.2 F (36.8 C) (05/23 0438) Pulse Rate:  [77-88] 80 (05/23 0438) Resp:  [15-19] 15 (05/23 0438) BP: (154-179)/(107-113) 162/113 (05/23 0438) SpO2:  [97 %-100 %] 98 % (05/23 0438)  Weight change:  Filed Weights   09/28/16 0500 09/29/16 0500 09/30/16 0428  Weight: 173 lb 1 oz (78.5 kg) 172 lb 2.9 oz (78.1 kg) 176 lb 5.9 oz (80 kg)    Intake/Output: I/O last 3 completed shifts: In: 770 [P.O.:770] Out: 2800 [Urine:2800]   Intake/Output this shift:  Total I/O In: -  Out: 350 [Urine:350]  CVS- RRR RS- CTA ABD- BS present soft non-distended EXT- no edema   Basic Metabolic Panel:  Recent Labs Lab 09/27/16 2012 09/28/16 0519 09/29/16 0559 09/30/16 0245  NA 139 138 137 135  K 3.3* 3.5 3.8 3.5  CL 104 104 103 101  CO2 20* 22 22 22   GLUCOSE 104* 128* 103* 105*  BUN 76* 77* 81* 83*  CREATININE 9.75* 9.75* 9.72* 10.15*  CALCIUM 8.3* 7.9* 8.0* 7.8*  MG 2.2 2.1 2.0  --   PHOS 7.0* 7.3* 5.9*  --     Liver Function Tests:  Recent Labs Lab 09/27/16 2012 09/30/16 0245  AST 16 12*  ALT 19 13*  ALKPHOS 63 52  BILITOT 0.8 0.5  PROT 6.4* 5.3*  ALBUMIN 3.2* 2.5*   No results for input(s): LIPASE, AMYLASE in the last 168 hours. No results for input(s): AMMONIA in the last 168 hours.  CBC:  Recent Labs Lab 09/27/16 2012 09/28/16 0519 09/29/16 2035 09/30/16 0245  WBC 14.1* 11.3*  --  10.2  HGB 7.6* 6.2* 7.2* 7.3*  HCT 21.6* 17.7* 20.8* 21.2*  MCV 80.3  81.2  --  83.5  PLT 212 195  --  231    Cardiac Enzymes:  Recent Labs Lab 09/27/16 2012  TROPONINI 0.08*    BNP: Invalid input(s): POCBNP  CBG:  Recent Labs Lab 09/28/16 1634 09/28/16 2051 09/29/16 0000 09/29/16 0429 09/29/16 0808  GLUCAP 125* 118* 134* 106* 98    Microbiology: Results for orders placed or performed during the hospital encounter of 09/27/16  MRSA PCR Screening     Status: None   Collection Time: 09/27/16  8:01 PM  Result Value Ref Range Status   MRSA by PCR NEGATIVE NEGATIVE Final    Comment:        The GeneXpert MRSA Assay (FDA approved for NASAL specimens only), is one component of a comprehensive MRSA colonization surveillance program. It is not intended to diagnose MRSA infection nor to guide or monitor treatment for MRSA infections.     Coagulation Studies: No results for input(s): LABPROT, INR in the last 72 hours.  Urinalysis: No results for input(s): COLORURINE, LABSPEC, PHURINE, GLUCOSEU, HGBUR, BILIRUBINUR, KETONESUR, PROTEINUR, UROBILINOGEN, NITRITE, LEUKOCYTESUR in the last 72 hours.  Invalid input(s): APPERANCEUR    Imaging: No results found.   Medications:    . amLODipine  10 mg Oral Daily  . carvedilol  25 mg Oral BID WC  . cloNIDine  0.2 mg Oral TID  . furosemide  40 mg Oral BID  . heparin  5,000 Units Subcutaneous Q8H  . hydrALAZINE  50 mg Oral Q8H  . pantoprazole  40 mg Oral Q1200   acetaminophen, acetaminophen, labetalol, ondansetron (ZOFRAN) IV  Assessment/ Plan:  1 Acute on CKD IV - no old labs here, but per pt baseline creat is around 5. [place]  He states that she is non compliant with follow up and has refused blood work . He states he has only seen her in the office a couple of times. She has refused dialysis here however when I signed off, she told her hospitalists that she would do dialysis. This morning she is sleeping and talked to Ed, her significant  other, he states she is just anxious but will do dialysis.We will obtain vein mapping and catheter placement. I have advised CLIP process to start  2 Vol overload/ hx pulm edema last week at outside facility - improved clinically  3 HTN - long standing, severe, on 4 oral BP meds now.  4 Anemia due to CKD - transfused    LOS: 4 Evalyne Cortopassi W @TODAY @7 :57 AM

## 2016-10-01 NOTE — Progress Notes (Signed)
Elevated troponin called to MD.

## 2016-10-02 ENCOUNTER — Inpatient Hospital Stay (HOSPITAL_COMMUNITY): Payer: Medicaid - Out of State

## 2016-10-02 ENCOUNTER — Encounter (HOSPITAL_COMMUNITY)
Admission: AD | Disposition: A | Discharge status: Home / Self Care | Payer: Self-pay | Source: Other Acute Inpatient Hospital | Attending: Internal Medicine

## 2016-10-02 ENCOUNTER — Encounter (HOSPITAL_COMMUNITY): Payer: Self-pay | Admitting: Anesthesiology

## 2016-10-02 ENCOUNTER — Inpatient Hospital Stay (HOSPITAL_COMMUNITY): Payer: Medicaid - Out of State | Admitting: Certified Registered Nurse Anesthetist

## 2016-10-02 DIAGNOSIS — R112 Nausea with vomiting, unspecified: Secondary | ICD-10-CM

## 2016-10-02 HISTORY — PX: INSERTION OF DIALYSIS CATHETER: SHX1324

## 2016-10-02 LAB — POCT I-STAT, CHEM 8
BUN: 82 mg/dL — ABNORMAL HIGH (ref 6–20)
Calcium, Ion: 1.02 mmol/L — ABNORMAL LOW (ref 1.15–1.40)
Chloride: 102 mmol/L (ref 101–111)
Creatinine, Ser: 11.3 mg/dL — ABNORMAL HIGH (ref 0.44–1.00)
Glucose, Bld: 118 mg/dL — ABNORMAL HIGH (ref 65–99)
HEMATOCRIT: 26 % — AB (ref 36.0–46.0)
HEMOGLOBIN: 8.8 g/dL — AB (ref 12.0–15.0)
POTASSIUM: 3.8 mmol/L (ref 3.5–5.1)
SODIUM: 138 mmol/L (ref 135–145)
TCO2: 24 mmol/L (ref 0–100)

## 2016-10-02 LAB — I-STAT BETA HCG BLOOD, ED (NOT ORDERABLE): HCG, QUANTITATIVE: 6.7 m[IU]/mL — AB (ref <5)

## 2016-10-02 LAB — CBC
HEMATOCRIT: 27.8 % — AB (ref 36.0–46.0)
HEMOGLOBIN: 9.4 g/dL — AB (ref 12.0–15.0)
MCH: 28.1 pg (ref 26.0–34.0)
MCHC: 33.8 g/dL (ref 30.0–36.0)
MCV: 83.2 fL (ref 78.0–100.0)
Platelets: 340 10*3/uL (ref 150–400)
RBC: 3.34 MIL/uL — AB (ref 3.87–5.11)
RDW: 14.1 % (ref 11.5–15.5)
WBC: 13.5 10*3/uL — ABNORMAL HIGH (ref 4.0–10.5)

## 2016-10-02 SURGERY — INSERTION OF DIALYSIS CATHETER
Anesthesia: General | Site: Neck | Laterality: Right

## 2016-10-02 MED ORDER — SODIUM CHLORIDE 0.9 % IV SOLN: 100.0000 mL | INTRAVENOUS | Status: DC | PRN

## 2016-10-02 MED ORDER — LABETALOL HCL 5 MG/ML IV SOLN
5.0000 mg | INTRAVENOUS | Status: DC | PRN
  Administered 2016-10-02 (×2): 10 mg via INTRAVENOUS

## 2016-10-02 MED ORDER — FENTANYL CITRATE (PF) 250 MCG/5ML IJ SOLN
INTRAMUSCULAR | Status: AC
  Filled 2016-10-02: qty 5

## 2016-10-02 MED ORDER — LACTATED RINGERS IV SOLN: INTRAVENOUS | Status: DC

## 2016-10-02 MED ORDER — CLONIDINE HCL 0.2 MG PO TABS: 0.2000 mg | ORAL_TABLET | Freq: Three times a day (TID) | ORAL | Status: DC

## 2016-10-02 MED ORDER — SODIUM CHLORIDE 0.9 % IV SOLN
INTRAVENOUS | Status: DC | PRN
  Administered 2016-10-02: 500 mL

## 2016-10-02 MED ORDER — LIDOCAINE-PRILOCAINE 2.5-2.5 % EX CREA: 1.0000 "application " | TOPICAL_CREAM | CUTANEOUS | Status: DC | PRN

## 2016-10-02 MED ORDER — 0.9 % SODIUM CHLORIDE (POUR BTL) OPTIME
TOPICAL | Status: DC | PRN
  Administered 2016-10-02: 1000 mL

## 2016-10-02 MED ORDER — PROMETHAZINE HCL 25 MG/ML IJ SOLN
6.2500 mg | INTRAMUSCULAR | Status: DC | PRN
Start: 2016-10-02 — End: 2016-10-02

## 2016-10-02 MED ORDER — HEPARIN SODIUM (PORCINE) 1000 UNIT/ML IJ SOLN
INTRAMUSCULAR | Status: DC | PRN
  Administered 2016-10-02: 3000 [IU]

## 2016-10-02 MED ORDER — MEPERIDINE HCL 25 MG/ML IJ SOLN: 6.2500 mg | INTRAMUSCULAR | Status: DC | PRN

## 2016-10-02 MED ORDER — HYDRALAZINE HCL 20 MG/ML IJ SOLN
INTRAMUSCULAR | Status: AC
  Administered 2016-10-02: 20 mg via INTRAVENOUS
  Filled 2016-10-02: qty 1

## 2016-10-02 MED ORDER — DARBEPOETIN ALFA 150 MCG/0.3ML IJ SOSY
150.0000 ug | PREFILLED_SYRINGE | INTRAMUSCULAR | Status: DC
  Filled 2016-10-02: qty 0.3

## 2016-10-02 MED ORDER — PROPOFOL 10 MG/ML IV BOLUS
INTRAVENOUS | Status: AC
  Filled 2016-10-02: qty 20

## 2016-10-02 MED ORDER — FUROSEMIDE 10 MG/ML IJ SOLN
40.0000 mg | Freq: Two times a day (BID) | INTRAMUSCULAR | Status: DC
  Administered 2016-10-03 – 2016-10-04 (×2): 40 mg via INTRAVENOUS
  Filled 2016-10-02 (×5): qty 4

## 2016-10-02 MED ORDER — SODIUM CHLORIDE 0.9 % IV SOLN
INTRAVENOUS | Status: DC
  Administered 2016-10-02 (×2): via INTRAVENOUS

## 2016-10-02 MED ORDER — FUROSEMIDE 10 MG/ML IJ SOLN
40.0000 mg | Freq: Two times a day (BID) | INTRAMUSCULAR | Status: DC
  Administered 2016-10-02: 40 mg via INTRAVENOUS
  Filled 2016-10-02: qty 4

## 2016-10-02 MED ORDER — LIDOCAINE HCL (PF) 1 % IJ SOLN: 5.0000 mL | INTRAMUSCULAR | Status: DC | PRN

## 2016-10-02 MED ORDER — ROCURONIUM 10MG/ML (10ML) SYRINGE FOR MEDFUSION PUMP - OPTIME
INTRAVENOUS | Status: DC | PRN
  Administered 2016-10-02: 20 mg via INTRAVENOUS

## 2016-10-02 MED ORDER — PROMETHAZINE HCL 25 MG/ML IJ SOLN
12.5000 mg | Freq: Once | INTRAMUSCULAR | Status: AC
  Administered 2016-10-02: 12.5 mg via INTRAVENOUS
  Filled 2016-10-02: qty 1

## 2016-10-02 MED ORDER — ALTEPLASE 2 MG IJ SOLR: 2.0000 mg | Freq: Once | INTRAMUSCULAR | Status: DC | PRN

## 2016-10-02 MED ORDER — LIDOCAINE-EPINEPHRINE (PF) 1 %-1:200000 IJ SOLN
INTRAMUSCULAR | Status: AC
  Filled 2016-10-02: qty 30

## 2016-10-02 MED ORDER — IOPAMIDOL (ISOVUE-300) INJECTION 61%
INTRAVENOUS | Status: AC
  Filled 2016-10-02: qty 30

## 2016-10-02 MED ORDER — MIDAZOLAM HCL 2 MG/2ML IJ SOLN
INTRAMUSCULAR | Status: AC
  Filled 2016-10-02: qty 2

## 2016-10-02 MED ORDER — HEPARIN SODIUM (PORCINE) 1000 UNIT/ML DIALYSIS: 1000.0000 [IU] | INTRAMUSCULAR | Status: DC | PRN

## 2016-10-02 MED ORDER — HYDRALAZINE HCL 20 MG/ML IJ SOLN
INTRAMUSCULAR | Status: AC
  Administered 2016-10-02: 5 mg via INTRAVENOUS
  Filled 2016-10-02: qty 1

## 2016-10-02 MED ORDER — CLONIDINE HCL 0.3 MG/24HR TD PTWK
0.3000 mg | MEDICATED_PATCH | TRANSDERMAL | Status: DC
  Administered 2016-10-02 – 2016-10-09 (×2): 0.3 mg via TRANSDERMAL
  Filled 2016-10-02 (×2): qty 1

## 2016-10-02 MED ORDER — CLONIDINE HCL 0.2 MG PO TABS
0.2000 mg | ORAL_TABLET | Freq: Four times a day (QID) | ORAL | Status: AC | PRN
  Administered 2016-10-03 – 2016-10-07 (×2): 0.2 mg via ORAL
  Filled 2016-10-02 (×3): qty 1

## 2016-10-02 MED ORDER — CEFAZOLIN SODIUM 1 G IJ SOLR
INTRAMUSCULAR | Status: DC | PRN
  Administered 2016-10-02: 1 g via INTRAMUSCULAR

## 2016-10-02 MED ORDER — FENTANYL CITRATE (PF) 100 MCG/2ML IJ SOLN: 25.0000 ug | INTRAMUSCULAR | Status: DC | PRN

## 2016-10-02 MED ORDER — HYDRALAZINE HCL 20 MG/ML IJ SOLN
5.0000 mg | INTRAMUSCULAR | Status: DC | PRN
  Administered 2016-10-02 (×2): 5 mg via INTRAVENOUS

## 2016-10-02 MED ORDER — HYDRALAZINE HCL 20 MG/ML IJ SOLN
20.0000 mg | Freq: Once | INTRAMUSCULAR | Status: AC
  Administered 2016-10-02: 20 mg via INTRAVENOUS
  Filled 2016-10-02: qty 1

## 2016-10-02 MED ORDER — PROTAMINE SULFATE 10 MG/ML IV SOLN
INTRAVENOUS | Status: AC
Start: 2016-10-02 — End: 2016-10-02
  Filled 2016-10-02: qty 5

## 2016-10-02 MED ORDER — CLONIDINE HCL 0.2 MG PO TABS
0.2000 mg | ORAL_TABLET | Freq: Once | ORAL | Status: AC
  Administered 2016-10-02: 0.2 mg via ORAL
  Filled 2016-10-02: qty 1

## 2016-10-02 MED ORDER — CLONIDINE HCL 0.2 MG PO TABS: 0.3000 mg | ORAL_TABLET | Freq: Three times a day (TID) | ORAL | Status: DC

## 2016-10-02 MED ORDER — LABETALOL HCL 5 MG/ML IV SOLN
INTRAVENOUS | Status: AC
  Filled 2016-10-02: qty 4

## 2016-10-02 MED ORDER — PENTAFLUOROPROP-TETRAFLUOROETH EX AERO: 1.0000 "application " | INHALATION_SPRAY | CUTANEOUS | Status: DC | PRN

## 2016-10-02 MED ORDER — HEPARIN SODIUM (PORCINE) 1000 UNIT/ML IJ SOLN
INTRAMUSCULAR | Status: AC
  Filled 2016-10-02: qty 1

## 2016-10-02 MED ORDER — HYDRALAZINE HCL 20 MG/ML IJ SOLN
INTRAMUSCULAR | Status: AC
  Filled 2016-10-02: qty 1

## 2016-10-02 MED ORDER — LORAZEPAM 2 MG/ML IJ SOLN
1.0000 mg | Freq: Once | INTRAMUSCULAR | Status: AC
  Administered 2016-10-02: 1 mg via INTRAVENOUS
  Filled 2016-10-02: qty 1

## 2016-10-02 MED ORDER — PROMETHAZINE HCL 25 MG/ML IJ SOLN: 6.2500 mg | INTRAMUSCULAR | Status: DC | PRN

## 2016-10-02 SURGICAL SUPPLY — 50 items
ARMBAND PINK RESTRICT EXTREMIT (MISCELLANEOUS) IMPLANT
BAG DECANTER FOR FLEXI CONT (MISCELLANEOUS) ×4 IMPLANT
BIOPATCH RED 1 DISK 7.0 (GAUZE/BANDAGES/DRESSINGS) ×3 IMPLANT
BIOPATCH RED 1IN DISK 7.0MM (GAUZE/BANDAGES/DRESSINGS) ×1
CANISTER SUCT 3000ML PPV (MISCELLANEOUS) ×4 IMPLANT
CATH PALINDROME RT-P 15FX19CM (CATHETERS) ×4 IMPLANT
CATH PALINDROME RT-P 15FX23CM (CATHETERS) IMPLANT
CATH PALINDROME RT-P 15FX28CM (CATHETERS) IMPLANT
CATH PALINDROME RT-P 15FX55CM (CATHETERS) IMPLANT
CLIP TI MEDIUM 6 (CLIP) IMPLANT
CLIP TI WIDE RED SMALL 6 (CLIP) IMPLANT
COVER PROBE W GEL 5X96 (DRAPES) ×4 IMPLANT
COVER SURGICAL LIGHT HANDLE (MISCELLANEOUS) ×4 IMPLANT
DERMABOND ADVANCED (GAUZE/BANDAGES/DRESSINGS) ×2
DERMABOND ADVANCED .7 DNX12 (GAUZE/BANDAGES/DRESSINGS) ×2 IMPLANT
DRAPE C-ARM 42X72 X-RAY (DRAPES) ×4 IMPLANT
DRAPE CHEST BREAST 15X10 FENES (DRAPES) ×4 IMPLANT
ELECT REM PT RETURN 9FT ADLT (ELECTROSURGICAL) ×4
ELECTRODE REM PT RTRN 9FT ADLT (ELECTROSURGICAL) ×2 IMPLANT
GAUZE SPONGE 4X4 16PLY XRAY LF (GAUZE/BANDAGES/DRESSINGS) ×4 IMPLANT
GLOVE BIO SURGEON STRL SZ7.5 (GLOVE) ×4 IMPLANT
GLOVE BIOGEL PI IND STRL 6.5 (GLOVE) ×4 IMPLANT
GLOVE BIOGEL PI INDICATOR 6.5 (GLOVE) ×4
GLOVE ECLIPSE 6.5 STRL STRAW (GLOVE) ×4 IMPLANT
GOWN STRL REUS W/ TWL LRG LVL3 (GOWN DISPOSABLE) ×4 IMPLANT
GOWN STRL REUS W/ TWL XL LVL3 (GOWN DISPOSABLE) ×2 IMPLANT
GOWN STRL REUS W/TWL LRG LVL3 (GOWN DISPOSABLE) ×4
GOWN STRL REUS W/TWL XL LVL3 (GOWN DISPOSABLE) ×2
KIT BASIN OR (CUSTOM PROCEDURE TRAY) ×4 IMPLANT
KIT ROOM TURNOVER OR (KITS) ×4 IMPLANT
NEEDLE 18GX1X1/2 (RX/OR ONLY) (NEEDLE) IMPLANT
NEEDLE HYPO 25GX1X1/2 BEV (NEEDLE) IMPLANT
NS IRRIG 1000ML POUR BTL (IV SOLUTION) ×4 IMPLANT
PACK CV ACCESS (CUSTOM PROCEDURE TRAY) IMPLANT
PACK SURGICAL SETUP 50X90 (CUSTOM PROCEDURE TRAY) ×4 IMPLANT
PAD ARMBOARD 7.5X6 YLW CONV (MISCELLANEOUS) ×8 IMPLANT
SOAP 2 % CHG 4 OZ (WOUND CARE) ×4 IMPLANT
SUT ETHILON 3 0 PS 1 (SUTURE) ×4 IMPLANT
SUT MNCRL AB 4-0 PS2 18 (SUTURE) ×4 IMPLANT
SUT PROLENE 6 0 BV (SUTURE) IMPLANT
SUT VIC AB 3-0 SH 27 (SUTURE)
SUT VIC AB 3-0 SH 27X BRD (SUTURE) IMPLANT
SYR 10ML LL (SYRINGE) ×4 IMPLANT
SYR 20CC LL (SYRINGE) ×8 IMPLANT
SYR 5ML LL (SYRINGE) ×4 IMPLANT
SYR CONTROL 10ML LL (SYRINGE) ×4 IMPLANT
TOWEL OR 17X24 6PK STRL BLUE (TOWEL DISPOSABLE) ×4 IMPLANT
TOWEL OR 17X26 4PK STRL BLUE (TOWEL DISPOSABLE) ×4 IMPLANT
UNDERPAD 30X30 (UNDERPADS AND DIAPERS) ×4 IMPLANT
WATER STERILE IRR 1000ML POUR (IV SOLUTION) ×4 IMPLANT

## 2016-10-02 NOTE — Transfer of Care (Signed)
Immediate Anesthesia Transfer of Care Note  Patient: Marcia Rogueiffany Thornton  Procedure(s) Performed: Procedure(s): INSERTION OF RIGHT INTERNAL JUGULAR DIALYSIS CATHETER (Right)  Patient Location: PACU  Anesthesia Type:General  Level of Consciousness: drowsy  Airway & Oxygen Therapy: Patient Spontanous Breathing and Patient connected to nasal cannula oxygen  Post-op Assessment: Report given to RN and Post -op Vital signs reviewed and stable  Post vital signs: Reviewed and stable  Last Vitals:  Vitals:   10/02/16 1423 10/02/16 1424  BP: (!) 158/109   Pulse: 92   Resp: (!) 25   Temp:  36.9 C    Last Pain:  Vitals:   10/02/16 1424  TempSrc:   PainSc: Asleep      Patients Stated Pain Goal: 0 (09/28/16 1600)  Complications: No apparent anesthesia complications

## 2016-10-02 NOTE — Progress Notes (Signed)
  Progress Note    10/02/2016 12:47 PM Day of Surgery  Subjective:  Feeling overall unwell  Vitals:   10/02/16 0645 10/02/16 0758  BP: (!) 163/113 (!) 169/105  Pulse: (!) 101 (!) 101  Resp: (!) 22 13  Temp:  99.1 F (37.3 C)    Physical Exam: Oriented Non labored respirations Abdomen is soft No edema bue  CBC    Component Value Date/Time   WBC 10.2 09/30/2016 0245   RBC 2.54 (L) 09/30/2016 0245   HGB 7.3 (L) 09/30/2016 0245   HCT 21.2 (L) 09/30/2016 0245   PLT 231 09/30/2016 0245   MCV 83.5 09/30/2016 0245   MCH 28.7 09/30/2016 0245   MCHC 34.4 09/30/2016 0245   RDW 14.7 09/30/2016 0245    BMET    Component Value Date/Time   NA 135 09/30/2016 0245   K 3.5 09/30/2016 0245   CL 101 09/30/2016 0245   CO2 22 09/30/2016 0245   GLUCOSE 105 (H) 09/30/2016 0245   BUN 83 (H) 09/30/2016 0245   CREATININE 10.15 (H) 09/30/2016 0245   CALCIUM 7.8 (L) 09/30/2016 0245   GFRNONAA 4 (L) 09/30/2016 0245   GFRAA 5 (L) 09/30/2016 0245    INR    Component Value Date/Time   INR 1.12 09/28/2016 0519     Intake/Output Summary (Last 24 hours) at 10/02/16 1247 Last data filed at 10/02/16 0900  Gross per 24 hour  Intake              500 ml  Output             2050 ml  Net            -1550 ml     Assessment:  36 y.o. female is here with esrd, in need of access  Plan: OR today for tunneled dialysis catheter. I discussed risk and benefits with her and significant other demonstrated good understanding and consent signed.   Raegan Sipp C. Randie Heinzain, MD [facility] Office: [phone] Pager: [phone]  10/02/2016 12:47 PM

## 2016-10-02 NOTE — Progress Notes (Signed)
[facility] ROUNDING NOTE   Subjective:   Interval History:  Interval History:36 year old female with past medical history of chronic kidney disease stage 5, hypertension and medical noncompliance was admitted [facility] for [facility] emergency. Subsequently she was [place]. She initially refused dialysis although now she states she will do it, She is planned for  AVF and permcath  Hopefully this afternoon. Ms Smitty CordsBruce appears miserable with increased BP and vomiting all night. Some of this may be from uremia and have discussed with Doreatha MassedSamantha Rhyne with [facility] and they will try to place catheter and fistula this afternoon   Objective:  Vital signs in last 24 hours:  Temp:  [97.7 F (36.5 C)-99.1 F (37.3 C)] 99.1 F (37.3 C) (05/24 0758) Pulse Rate:  [77-101] 101 (05/24 0758) Resp:  [13-24] 13 (05/24 0758) BP: (144-196)/(86-135) 169/105 (05/24 0758) SpO2:  [93 %-100 %] 96 % (05/24 0758) Weight:  [169 lb 15.6 oz (77.1 kg)] 169 lb 15.6 oz (77.1 kg) (05/24 0500)  Weight change:  Filed Weights   09/29/16 0500 09/30/16 0428 10/02/16 0500  Weight: 172 lb 2.9 oz (78.1 kg) 176 lb 5.9 oz (80 kg) 169 lb 15.6 oz (77.1 kg)    Intake/Output: I/O last 3 completed shifts: In: 790 [P.O.:790] Out: 2850 [Urine:2650; Emesis/NG output:200]   Intake/Output this shift:  No intake/output data recorded.  CVS- RRR RS- CTA ABD- BS present soft non-distended EXT- no edema   Basic Metabolic Panel:  Recent Labs Lab 09/27/16 2012 09/28/16 0519 09/29/16 0559 09/30/16 0245  NA 139 138 137 135  K 3.3* 3.5 3.8 3.5  CL 104 104 103 101  CO2 20* 22 22 22   GLUCOSE 104* 128* 103* 105*  BUN 76* 77* 81* 83*  CREATININE 9.75* 9.75* 9.72* 10.15*  CALCIUM 8.3* 7.9* 8.0* 7.8*  MG 2.2 2.1 2.0  --   PHOS 7.0* 7.3* 5.9*  --     Liver Function Tests:  Recent Labs Lab 09/27/16 2012 09/30/16 0245  AST 16 12*  ALT 19 13*  ALKPHOS 63 52  BILITOT  0.8 0.5  PROT 6.4* 5.3*  ALBUMIN 3.2* 2.5*   No results for input(s): LIPASE, AMYLASE in the last 168 hours. No results for input(s): AMMONIA in the last 168 hours.  CBC:  Recent Labs Lab 09/27/16 2012 09/28/16 0519 09/29/16 2035 09/30/16 0245  WBC 14.1* 11.3*  --  10.2  HGB 7.6* 6.2* 7.2* 7.3*  HCT 21.6* 17.7* 20.8* 21.2*  MCV 80.3 81.2  --  83.5  PLT 212 195  --  231    Cardiac Enzymes:  Recent Labs Lab 09/27/16 2012 10/01/16 1031 10/01/16 1453 10/01/16 2207  TROPONINI 0.08* 0.03* 0.03* <0.03    BNP: Invalid input(s): POCBNP  CBG:  Recent Labs Lab 09/28/16 1634 09/28/16 2051 09/29/16 0000 09/29/16 0429 09/29/16 0808  GLUCAP 125* 118* 134* 106* 98    Microbiology: Results for orders placed or performed during the hospital encounter of 09/27/16  MRSA PCR Screening     Status: None   Collection Time: 09/27/16  8:01 PM  Result Value Ref Range Status   MRSA by PCR NEGATIVE NEGATIVE Final    Comment:        The GeneXpert MRSA Assay (FDA approved for NASAL specimens only), is one component of a comprehensive MRSA colonization surveillance program. It is not intended to diagnose MRSA infection nor to guide or monitor treatment for MRSA infections.     Coagulation Studies: No results for  input(s): LABPROT, INR in the last 72 hours.  Urinalysis: No results for input(s): COLORURINE, LABSPEC, PHURINE, GLUCOSEU, HGBUR, BILIRUBINUR, KETONESUR, PROTEINUR, UROBILINOGEN, NITRITE, LEUKOCYTESUR in the last 72 hours.  Invalid input(s): APPERANCEUR    Imaging: US Abdomen Complete  Result Date: 10/01/2016 CLINICAL DATA:  Chronic kidney disease, nausea and vomiting for 10 days EXAM: ABDOMEN ULTRASOUND COMPLETE COMPARISON:  None. FINDINGS: Gallbladder: No gallstones or wall thickening visualized. No sonographic Murphy sign noted by sonographer. Common bile duct: Diameter: 4.2 mm Liver: No focal lesion identified. Within normal limits in parenchymal  echogenicity. IVC: No abnormality visualized. Pancreas: Visualized portion unremarkable. Spleen: Size and appearance within normal limits. Right Kidney: Length: 8.9 cm. Increased cortical echogenicity. No hydronephrosis. Left Kidney: Length: 10.1 cm. Increased cortical echogenicity. No hydronephrosis. Abdominal aorta: No aneurysm visualized. Other findings: Incidental note made of small left pleural effusion IMPRESSION: 1. Negative for gallstones or biliary dilatation 2. Echogenic kidneys consistent with medical renal disease. No hydronephrosis 3. Incidental trace left pleural effusion Electronically Signed   By: Jasmine Pang M.D.   On: 10/01/2016 22:25     Medications:    . amLODipine  10 mg Oral Daily  . carvedilol  25 mg Oral BID WC  . cloNIDine  0.3 mg Transdermal Weekly  . cloNIDine  0.2 mg Oral TID  . darbepoetin (ARANESP) injection - DIALYSIS  150 mcg Intravenous Q Wed-HD  . furosemide  40 mg Oral BID  . heparin  5,000 Units Subcutaneous Q8H  . hydrALAZINE  50 mg Oral Q8H  . pantoprazole (PROTONIX) IV  40 mg Intravenous Q24H   acetaminophen, acetaminophen, hydrALAZINE, labetalol, LORazepam, nitroGLYCERIN, ondansetron (ZOFRAN) IV  Assessment/ Plan:  1 Acute on CKD IV - no old labs here, but per pt baseline creat is around 5. Discussed with her nephrologist in [place] He states that she is non compliant with follow up and has refused blood work . He states he has only seen her in the office a couple of times. She has refused dialysis here however when I signed off, she told her hospitalists that she would do dialysis. This morning she is sleeping and talked to Ed, her significant other, he states she is just anxious but will do dialysis.Hopefully once catheter placed we can proceed with dialysis . I have advised CLIP process to start  2 Vol overload/ hx pulm edema last week at outside facility - improved clinically  3 HTN - long standing, severe, on 4 oral BP meds now.  4  Anemia due to CKD - transfused    LOS: 5 Yaneliz Radebaugh W @TODAY @8 :55 AM

## 2016-10-02 NOTE — Progress Notes (Signed)
Dr. Hyman HopesWebb talked to me about getting her surgery done today ([facility]  The OR schedule is very busy and could possibly add her on for the end of the day.  Will make npo and if able to get her done today we will, if not, we will have to keep her on the schedule tomorrow.  Dr. Hyman HopesWebb is thinking about getting CCM to place a temp cath to dialyze her and if that is the case, we will leave her on the schedule for tomorrow.  Tried to page Dr. Hyman HopesWebb after speaking with Dr. Randie Heinzain.  Marcia Thornton, [facility] 10/02/2016 7:47 AM

## 2016-10-02 NOTE — Anesthesia Postprocedure Evaluation (Addendum)
Anesthesia Post Note  Patient: Marcia Rogueiffany Thornton  Procedure(s) Performed: Procedure(s) (LRB): INSERTION OF RIGHT INTERNAL JUGULAR DIALYSIS CATHETER (Right)  Patient location during evaluation: PACU Anesthesia Type: General Level of consciousness: awake and alert Pain management: pain level controlled Vital Signs Assessment: post-procedure vital signs reviewed and stable Respiratory status: spontaneous breathing, nonlabored ventilation, respiratory function stable and patient connected to nasal cannula oxygen Cardiovascular status: blood pressure returned to baseline and stable Postop Assessment: no signs of nausea or vomiting Anesthetic complications: no Comments: Transferred to dialysis       Last Vitals:  Vitals:   10/02/16 1541 10/02/16 1600  BP:  (!) 174/115  Pulse:  98  Resp: 19 20  Temp:      Last Pain:  Vitals:   10/02/16 1455  TempSrc:   PainSc: 0-No pain                 Shelton SilvasKevin D Hollis

## 2016-10-02 NOTE — Progress Notes (Signed)
PROGRESS NOTE    Marcia Thornton  UJW:[id number] DOB: February 17, 1981 DOA: 09/27/2016 PCP: No primary care provider on file.   Brief Narrative:   36 year old female with past medical history of chronic kidney disease, hypertension and medical noncompliance was admitted from [facility] for hypertensive emergency. Subsequently she was transferred for possible HD and hypertensive urgency. She developed uremic symptoms starting on 5/23, and an urgent right IJ hd catheter placed by vascular today.  Plan for HD as per nephrology.   Assessment & Plan:   Active Problems:   Hypertensive emergency  Acute on chronic CKD; unknown stage previously -Currently she is agreeable for dialysis. Nephrology following -She will require dialysis access. Preference deferred to nephrology -Potassium is normal at this time. But her creatinine continues to rise along with BUN -Closely monitor urine output. - vascular consulted by nephrology for vein mapping and for HD catheter placement.  - she developed uremic symptoms with persistent nausea, vomiting .  - urgent placement of right IJ HD catheter on 5/24. Plan for HD as per renal .   Hypertensive emergency -Blood pressure remains elevated as she is not able to take any po. Changed most of her bp meds to IV, patch. Resume lasix, clonidine, coreg, norvasc, labetalol and hydralazine. Once she is able to take po, we will resume her oral meds.  -She will need dialysis, pt agreeable.   Acute hypoxic respiratory failure secondary to pulmonary edema -Currently has improved. Supplemental oxygen as needed -Dialysis should help her - resume LASIX.    Anemia chronic kidney disease -Continue to monitor hemoglobin -Iron studies done on 09/28/2016 - hemoglobin around 7. S/p  prbc transfusion.    Medical noncompliance Discussed with the patient by multiple providers and she is agreeable to HD and blood draws..   Chest pain: substernal non radiating, occurred at  rest, EKG shows normal sinus rhythm, prolonged QT, t wave abnormality and lateral ischemia. troponins sent, ECHO, and repeat EKG unchanged.  Will request cardiology consultation if echo abnormal.  Pt refused echocardiogram. No more chest pain today.   Nausea. Vomiting: secondary to uremia.  Mild abd discomfort.  US abdomen ordered, does not show any acute pathology.  As symptoms are persistent, follow up with CT abd      DVT prophylaxis: heparin.  Code Status: Full Family Communication:  None at bedside.  Disposition Plan: Maintain stepdown at this time  Consultants:   Nephrology  Vascular surgery.   Procedures:   Right IJ catheter   Antimicrobials:   None    Subjective: Nausea, vomiting.persistent.   Objective: Vitals:   10/02/16 1431 10/02/16 1433 10/02/16 1448 10/02/16 1504  BP: (!) 164/111 (!) 171/113 (!) 170/115 (!) 169/110  Pulse: 93 91 92 94  Resp: (!) 22 18 (!) 23 (!) 21  Temp:      TempSrc:      SpO2: 98% 98% 98% 99%  Weight:        Intake/Output Summary (Last 24 hours) at 10/02/16 1546 Last data filed at 10/02/16 0900  Gross per 24 hour  Intake              400 ml  Output             1600 ml  Net            -1200 ml   Filed Weights   09/29/16 0500 09/30/16 0428 10/02/16 0500  Weight: 78.1 kg (172 lb 2.9 oz) 80 kg (176 lb 5.9 oz) 77.1 kg (169  lb 15.6 oz)    Examination:  General exam: Appears in mild distress from nausea.  Respiratory system: Clear to auscultation. Respiratory effort normal. Cardiovascular system: S1 & S2 heard, RRR. No JVD, murmurs, rubs, gallops or clicks. No pedal edema. Gastrointestinal system: Abdomen is nondistended, soft and nontender. No organomegaly or masses felt. Normal bowel sounds heard. Central nervous system: Alert and oriented. No focal neurological deficits. Extremities: Symmetric 5 x 5 power. Skin: No rashes, lesions or ulcers     Data Reviewed:   CBC:  Recent Labs Lab 09/27/16 2012 09/28/16 0519  09/29/16 2035 09/30/16 0245 10/02/16 1312 10/02/16 1319  WBC 14.1* 11.3*  --  10.2  --  13.5*  HGB 7.6* 6.2* 7.2* 7.3* 8.8* 9.4*  HCT 21.6* 17.7* 20.8* 21.2* 26.0* 27.8*  MCV 80.3 81.2  --  83.5  --  83.2  PLT 212 195  --  231  --  340   Basic Metabolic Panel:  Recent Labs Lab 09/27/16 2012 09/28/16 0519 09/29/16 0559 09/30/16 0245 10/02/16 1312  NA 139 138 137 135 138  K 3.3* 3.5 3.8 3.5 3.8  CL 104 104 103 101 102  CO2 20* 22 22 22   --   GLUCOSE 104* 128* 103* 105* 118*  BUN 76* 77* 81* 83* 82*  CREATININE 9.75* 9.75* 9.72* 10.15* 11.30*  CALCIUM 8.3* 7.9* 8.0* 7.8*  --   MG 2.2 2.1 2.0  --   --   PHOS 7.0* 7.3* 5.9*  --   --    GFR: CrCl cannot be calculated (Unknown ideal weight.). Liver Function Tests:  Recent Labs Lab 09/27/16 2012 09/30/16 0245  AST 16 12*  ALT 19 13*  ALKPHOS 63 52  BILITOT 0.8 0.5  PROT 6.4* 5.3*  ALBUMIN 3.2* 2.5*   No results for input(s): LIPASE, AMYLASE in the last 168 hours. No results for input(s): AMMONIA in the last 168 hours. Coagulation Profile:  Recent Labs Lab 09/28/16 0519  INR 1.12   Cardiac Enzymes:  Recent Labs Lab 09/27/16 2012 10/01/16 1031 10/01/16 1453 10/01/16 2207  TROPONINI 0.08* 0.03* 0.03* <0.03   BNP (last 3 results) No results for input(s): PROBNP in the last 8760 hours. HbA1C: No results for input(s): HGBA1C in the last 72 hours. CBG:  Recent Labs Lab 09/28/16 1634 09/28/16 2051 09/29/16 0000 09/29/16 0429 09/29/16 0808  GLUCAP 125* 118* 134* 106* 98   Lipid Profile: No results for input(s): CHOL, HDL, LDLCALC, TRIG, CHOLHDL, LDLDIRECT in the last 72 hours. Thyroid Function Tests: No results for input(s): TSH, T4TOTAL, FREET4, T3FREE, THYROIDAB in the last 72 hours. Anemia Panel:  Recent Labs  10/01/16 1453  FERRITIN 93   Sepsis Labs:  Recent Labs Lab 09/27/16 2012 09/27/16 2017 09/27/16 2345 09/28/16 0519 09/29/16 0559  PROCALCITON 0.73  --   --  0.86 0.84    LATICACIDVEN  --  0.6 0.6  --   --     Recent Results (from the past 240 hour(s))  MRSA PCR Screening     Status: None   Collection Time: 09/27/16  8:01 PM  Result Value Ref Range Status   MRSA by PCR NEGATIVE NEGATIVE Final    Comment:        The GeneXpert MRSA Assay (FDA approved for NASAL specimens only), is one component of a comprehensive MRSA colonization surveillance program. It is not intended to diagnose MRSA infection nor to guide or monitor treatment for MRSA infections.  Radiology Studies: [place] Abdomen Complete  Result Date: 10/01/2016 CLINICAL DATA:  Chronic kidney disease, nausea and vomiting for 10 days EXAM: ABDOMEN ULTRASOUND COMPLETE COMPARISON:  None. FINDINGS: Gallbladder: No gallstones or wall thickening visualized. No sonographic Murphy sign noted by sonographer. Common bile duct: Diameter: 4.2 mm Liver: No focal lesion identified. Within normal limits in parenchymal echogenicity. IVC: No abnormality visualized. Pancreas: Visualized portion unremarkable. Spleen: Size and appearance within normal limits. Right Kidney: Length: 8.9 cm. Increased cortical echogenicity. No hydronephrosis. Left Kidney: Length: 10.1 cm. Increased cortical echogenicity. No hydronephrosis. Abdominal aorta: No aneurysm visualized. Other findings: Incidental note made of small left pleural effusion IMPRESSION: 1. Negative for gallstones or biliary dilatation 2. Echogenic kidneys consistent with medical renal disease. No hydronephrosis 3. Incidental trace left pleural effusion Electronically Signed   By: Jasmine PangKim  Fujinaga M.D.   On: 10/01/2016 22:25   Dg Chest Port 1 View  Result Date: 10/02/2016 CLINICAL DATA:  Dialysis catheter insertion EXAM: PORTABLE CHEST 1 VIEW COMPARISON:  09/27/2016 FINDINGS: Stable cardiomegaly. Low volumes with atelectasis greater on the left. Permacatheter on the right with tip at the upper right atrium. No pneumothorax. IMPRESSION: 1. No acute finding after  permacatheter placement. Tip is at the upper cavoatrial junction. 2. Chronic cardiomegaly. 3. Low volume chest with atelectasis. Electronically Signed   By: Marnee SpringJonathon  Watts M.D.   On: 10/02/2016 15:19   Dg Fluoro Guide Cv Line-no Report  Result Date: 10/02/2016 Fluoroscopy was utilized by the requesting physician.  No radiographic interpretation.        Scheduled Meds: . [MAR Hold] amLODipine  10 mg Oral Daily  . [MAR Hold] carvedilol  25 mg Oral BID WC  . [MAR Hold] cloNIDine  0.3 mg Transdermal Weekly  . [MAR Hold] cloNIDine  0.2 mg Oral TID  . [START ON 10/08/2016] darbepoetin (ARANESP) injection - DIALYSIS  150 mcg Subcutaneous Q Wed  . [MAR Hold] furosemide  40 mg Intravenous Q12H  . [MAR Hold] heparin  5,000 Units Subcutaneous Q8H  . [MAR Hold] hydrALAZINE  50 mg Oral Q8H  . iopamidol      . [MAR Hold] pantoprazole (PROTONIX) IV  40 mg Intravenous Q24H   Continuous Infusions: . sodium chloride 10 mL/hr at 10/02/16 1301  . lactated ringers       LOS: 5 days    Time spent: 35 mins     Zia Najera, MD [facility] Pager [phone]  If 7PM-7AM, please contact night-coverage [email or web address] Password [email or web address] 10/02/2016, 3:46 PM

## 2016-10-02 NOTE — Progress Notes (Signed)
@  [id number] Paged Kirtland BouchardK. Schorr of [facility] regarding pt's BPs (190s/130s) despite IV PRNs. Order received for 1 time dose of Catapres and IV Hydralazine and administered. SBP down to 148

## 2016-10-02 NOTE — Anesthesia Preprocedure Evaluation (Addendum)
Anesthesia Evaluation  Patient identified by MRN, date of birth, ID band Patient awake    Reviewed: Allergy & Precautions, NPO status , Patient's Chart, lab work & pertinent test results  Airway Mallampati: II  TM Distance: >3 FB Neck ROM: Full    Dental  (+) Teeth Intact, Dental Advisory Given   Pulmonary neg pulmonary ROS,    breath sounds clear to auscultation       Cardiovascular hypertension, Pt. on medications  Rhythm:Regular Rate:Tachycardia     Neuro/Psych negative neurological ROS  negative psych ROS   GI/Hepatic negative GI ROS,   Endo/Other  negative endocrine ROS  Renal/GU CRFRenal disease  negative genitourinary   Musculoskeletal negative musculoskeletal ROS (+)   Abdominal   Peds negative pediatric ROS (+)  Hematology negative hematology ROS (+)   Anesthesia Other Findings   Reproductive/Obstetrics negative OB ROS                            Anesthesia Physical Anesthesia Plan  ASA: III  Anesthesia Plan: General   Post-op Pain Management:    Induction: Intravenous, Rapid sequence and Cricoid pressure planned  Airway Management Planned: Oral ETT  Additional Equipment:   Intra-op Plan:   Post-operative Plan: Extubation in OR  Informed Consent: I have reviewed the patients History and Physical, chart, labs and discussed the procedure including the risks, benefits and alternatives for the proposed anesthesia with the patient or authorized representative who has indicated his/her understanding and acceptance.   Dental advisory given  Plan Discussed with:   Anesthesia Plan Comments: (hcg 6.7, slightly above normal limits to rule out pregnancy. Pt denies sexual activity and accepts the risk of proceeding with general anesthesia for procedure. Ms. Avallone is not a candidate for MAC due to her continuous vomiting and inability to lay flat with during the procedure. )         Anesthesia Quick Evaluation

## 2016-10-02 NOTE — Anesthesia Procedure Notes (Signed)
Procedure Name: Intubation Date/Time: 10/02/2016 1:37 PM Performed by: Teressa Lower Pre-anesthesia Checklist: Patient identified, Emergency Drugs available, Suction available and Patient being monitored Patient Re-evaluated:Patient Re-evaluated prior to inductionOxygen Delivery Method: Circle System Utilized Preoxygenation: Pre-oxygenation with 100% oxygen Intubation Type: IV induction Ventilation: Mask ventilation without difficulty Laryngoscope Size: Mac and 3 Grade View: Grade I Tube type: Oral Tube size: 7.0 mm Number of attempts: 1 Airway Equipment and Method: Stylet and Oral airway Placement Confirmation: ETT inserted through vocal cords under direct vision,  positive ETCO2 and breath sounds checked- equal and bilateral Secured at: 21 cm Tube secured with: Tape Dental Injury: Teeth and Oropharynx as per pre-operative assessment

## 2016-10-02 NOTE — CV Procedure (Signed)
Echocardiogram was not completed due to patients refusal. This is our third attempt and patient still doesn't want exam. Patients family member states "the EKG is normal I see no need to look at her heart".   Marcia Junglingiffany Judith Demps [facility] 10/02/2016 10:19 AM

## 2016-10-02 NOTE — Anesthesia Preprocedure Evaluation (Addendum)
Anesthesia Evaluation  Patient identified by MRN, date of birth, ID band Patient awake    Reviewed: Allergy & Precautions, NPO status , Patient's Chart, lab work & pertinent test results  Airway Mallampati: III  TM Distance: >3 FB Neck ROM: Full    Dental  (+) Poor Dentition   Pulmonary neg pulmonary ROS,    Pulmonary exam normal breath sounds clear to auscultation       Cardiovascular hypertension, Pt. on medications and Pt. on home beta blockers Normal cardiovascular exam Rhythm:Regular Rate:Normal     Neuro/Psych negative neurological ROS  negative psych ROS   GI/Hepatic negative GI ROS, Neg liver ROS,   Endo/Other  negative endocrine ROS  Renal/GU ESRF, Dialysis and CRFRenal disease  negative genitourinary   Musculoskeletal negative musculoskeletal ROS (+)   Abdominal   Peds  Hematology  (+) anemia ,   Anesthesia Other Findings   Reproductive/Obstetrics                            Anesthesia Physical Anesthesia Plan  ASA: III  Anesthesia Plan: MAC   Post-op Pain Management:    Induction: Intravenous  Airway Management Planned: Natural Airway, LMA and Mask  Additional Equipment:   Intra-op Plan:   Post-operative Plan: Extubation in OR  Informed Consent: I have reviewed the patients History and Physical, chart, labs and discussed the procedure including the risks, benefits and alternatives for the proposed anesthesia with the patient or authorized representative who has indicated his/her understanding and acceptance.   Dental advisory given  Plan Discussed with: CRNA, Anesthesiologist and Surgeon  Anesthesia Plan Comments:        Anesthesia Quick Evaluation

## 2016-10-02 NOTE — Op Note (Signed)
    OPERATIVE NOTE  PROCEDURE: Right internal jugular vein tunneled dialysis catheter placement  PRE-OPERATIVE DIAGNOSIS: end-stage renal failure  POST-OPERATIVE DIAGNOSIS: same as above  SURGEON: Jacquis Paxton C. Randie Heinzain, MD  ANESTHESIA: general  ESTIMATED BLOOD LOSS: 10 cc  FINDING(S): 1.  Tips of the catheter in the right atrium on fluoroscopy 2.  No obvious pneumothorax on fluoroscopy  SPECIMEN(S):  none  INDICATIONS:   Marcia Rogueiffany Newsom is a 36 y.o. female who presents with end stage renal disease.  The patient presents for tunneled dialysis catheter placement.  The patient is aware the risks of tunneled dialysis catheter placement include but are not limited to: bleeding, infection, central venous injury, pneumothorax, possible venous stenosis, possible malpositioning in the venous system, and possible infections related to long-term catheter presence.  The patient was aware of these risks and agreed to proceed.  DESCRIPTION: After written full informed consent was obtained from the patient, the patient was taken back to the operating room.  Prior to induction, the patient was given IV antibiotics.  After obtaining general anesthesia, the patient was prepped and draped in the standard fashion for a chest or neck tunneled dialysis catheter placement.  Under ultrasound guidance, the right internal jugular vein was cannulated with the 18 gauge needle.  A J-wire was then placed. The wire was then secured in place with a clamp to the drapes.  I then made stab incisions at the neck and exit sites.   I dissected from the exit site to the cannulation site with a tunneler.   The subcutaneous tunnel was dilated by passing a plastic dilator over the metal dissector. The wire was then unclamped and I removed the needle.  The skin tract and venotomy was dilated serially with dilators.  Finally, the dilator-sheath was placed under fluoroscopic guidance into the superior vena cava.  The dilator and wire were  removed.  A 19 cm Diatek catheter was placed under fluoroscopic guidance down into the right atrium.  The sheath was broken and peeled away while holding the catheter cuff at the level of the skin.  The back end of this catheter was transected, and docked onto the tunneler.  The distal catheter was delivered through the subcutaneous tunnel.  The catheter was transected a second time, revealing the two lumens of this catheter.  The ports were docked onto these two lumens.  The catheter collar was then snapped into place.  Each port was tested by aspirating and flushing.  No resistance was noted.  Each port was then thoroughly flushed with heparinized saline.  The catheter was secured in placed with two interrupted stitches of 3-0 Nylon tied to the catheter.  The neck incision was closed with a U-stitch of 4-0 Monocryl.  The neck and chest incision were cleaned and sterile bandages applied.  Each port was then loaded with concentrated heparin and sterile caps were applied to each port. On completion fluoroscopy, the tips of the catheter were in the right atrium, and there was no evidence of pneumothorax. Chest xray will be checked in pacu.   Flouro time: 10 seconds    Marcia Thornton C. Randie Heinzain, MD [facility] Office: [phone] Pager: [phone]   10/02/2016, 2:08 PM

## 2016-10-03 ENCOUNTER — Encounter (HOSPITAL_COMMUNITY): Payer: Self-pay | Admitting: Vascular Surgery

## 2016-10-03 ENCOUNTER — Inpatient Hospital Stay (HOSPITAL_COMMUNITY): Payer: Medicaid - Out of State | Admitting: Anesthesiology

## 2016-10-03 ENCOUNTER — Encounter (HOSPITAL_COMMUNITY)
Admission: AD | Disposition: A | Discharge status: Home / Self Care | Payer: Self-pay | Source: Other Acute Inpatient Hospital | Attending: Internal Medicine

## 2016-10-03 ENCOUNTER — Telehealth: Payer: Self-pay | Admitting: Vascular Surgery

## 2016-10-03 HISTORY — PX: AV FISTULA PLACEMENT: SHX1204

## 2016-10-03 LAB — HEPATITIS B CORE ANTIBODY, TOTAL: HEP B C TOTAL AB: NEGATIVE

## 2016-10-03 LAB — HEPATITIS B SURFACE ANTIBODY,QUALITATIVE: HEP B S AB: NONREACTIVE

## 2016-10-03 LAB — HEPATITIS B SURFACE ANTIGEN: Hepatitis B Surface Ag: NEGATIVE

## 2016-10-03 SURGERY — ARTERIOVENOUS (AV) FISTULA CREATION
Anesthesia: General | Site: Arm Lower

## 2016-10-03 MED ORDER — LABETALOL HCL 5 MG/ML IV SOLN
INTRAVENOUS | Status: DC | PRN
  Administered 2016-10-03 (×4): 10 mg via INTRAVENOUS

## 2016-10-03 MED ORDER — LIDOCAINE HCL (PF) 1 % IJ SOLN: 5.0000 mL | INTRAMUSCULAR | Status: DC | PRN

## 2016-10-03 MED ORDER — DEXAMETHASONE SODIUM PHOSPHATE 10 MG/ML IJ SOLN
INTRAMUSCULAR | Status: DC | PRN
  Administered 2016-10-03: 10 mg via INTRAVENOUS

## 2016-10-03 MED ORDER — FENTANYL CITRATE (PF) 250 MCG/5ML IJ SOLN
INTRAMUSCULAR | Status: AC
  Filled 2016-10-03: qty 5

## 2016-10-03 MED ORDER — DEXAMETHASONE SODIUM PHOSPHATE 10 MG/ML IJ SOLN
INTRAMUSCULAR | Status: AC
  Filled 2016-10-03: qty 1

## 2016-10-03 MED ORDER — HEPARIN SODIUM (PORCINE) 1000 UNIT/ML IJ SOLN
INTRAMUSCULAR | Status: DC | PRN
  Administered 2016-10-03: 5000 [IU] via INTRAVENOUS

## 2016-10-03 MED ORDER — SODIUM CHLORIDE 0.9 % IV SOLN: 100.0000 mL | INTRAVENOUS | Status: DC | PRN

## 2016-10-03 MED ORDER — METOPROLOL TARTRATE 5 MG/5ML IV SOLN
INTRAVENOUS | Status: AC
  Filled 2016-10-03: qty 10

## 2016-10-03 MED ORDER — PROMETHAZINE HCL 25 MG/ML IJ SOLN
12.5000 mg | Freq: Four times a day (QID) | INTRAMUSCULAR | Status: DC | PRN
  Administered 2016-10-03 – 2016-10-10 (×5): 25 mg via INTRAVENOUS
  Filled 2016-10-03 (×6): qty 1

## 2016-10-03 MED ORDER — ONDANSETRON HCL 4 MG/2ML IJ SOLN
INTRAMUSCULAR | Status: AC
  Filled 2016-10-03: qty 2

## 2016-10-03 MED ORDER — ONDANSETRON HCL 4 MG/2ML IJ SOLN: 4.0000 mg | Freq: Once | INTRAMUSCULAR | Status: DC | PRN

## 2016-10-03 MED ORDER — PROPOFOL 10 MG/ML IV BOLUS
INTRAVENOUS | Status: AC
  Filled 2016-10-03: qty 20

## 2016-10-03 MED ORDER — PHENYLEPHRINE 40 MCG/ML (10ML) SYRINGE FOR IV PUSH (FOR BLOOD PRESSURE SUPPORT)
PREFILLED_SYRINGE | INTRAVENOUS | Status: AC
  Filled 2016-10-03: qty 10

## 2016-10-03 MED ORDER — HYDROMORPHONE HCL 1 MG/ML IJ SOLN: 0.2500 mg | INTRAMUSCULAR | Status: DC | PRN

## 2016-10-03 MED ORDER — DEXTROSE 5 % IV SOLN
INTRAVENOUS | Status: DC | PRN
  Administered 2016-10-03: 1.5 g via INTRAVENOUS

## 2016-10-03 MED ORDER — ALTEPLASE 2 MG IJ SOLR: 2.0000 mg | Freq: Once | INTRAMUSCULAR | Status: DC | PRN

## 2016-10-03 MED ORDER — 0.9 % SODIUM CHLORIDE (POUR BTL) OPTIME
TOPICAL | Status: DC | PRN
  Administered 2016-10-03: 1000 mL

## 2016-10-03 MED ORDER — LIDOCAINE HCL 1 % IJ SOLN
INTRAMUSCULAR | Status: AC
  Filled 2016-10-03: qty 20

## 2016-10-03 MED ORDER — FENTANYL CITRATE (PF) 100 MCG/2ML IJ SOLN
INTRAMUSCULAR | Status: DC | PRN
  Administered 2016-10-03 (×2): 50 ug via INTRAVENOUS
  Administered 2016-10-03: 100 ug via INTRAVENOUS
  Administered 2016-10-03: 50 ug via INTRAVENOUS

## 2016-10-03 MED ORDER — PENTAFLUOROPROP-TETRAFLUOROETH EX AERO: 1.0000 "application " | INHALATION_SPRAY | CUTANEOUS | Status: DC | PRN

## 2016-10-03 MED ORDER — LIDOCAINE 2% (20 MG/ML) 5 ML SYRINGE
INTRAMUSCULAR | Status: DC | PRN
  Administered 2016-10-03: 70 mg via INTRAVENOUS

## 2016-10-03 MED ORDER — SODIUM CHLORIDE 0.9 % IV SOLN
INTRAVENOUS | Status: DC | PRN
  Administered 2016-10-03: 07:00:00 via INTRAVENOUS

## 2016-10-03 MED ORDER — DEXTROSE 5 % IV SOLN
INTRAVENOUS | Status: AC
  Filled 2016-10-03: qty 1.5

## 2016-10-03 MED ORDER — ACETAMINOPHEN 325 MG PO TABS
ORAL_TABLET | ORAL | Status: AC
  Filled 2016-10-03: qty 2

## 2016-10-03 MED ORDER — LIDOCAINE 2% (20 MG/ML) 5 ML SYRINGE
INTRAMUSCULAR | Status: AC
  Filled 2016-10-03: qty 5

## 2016-10-03 MED ORDER — LIDOCAINE-PRILOCAINE 2.5-2.5 % EX CREA: 1.0000 "application " | TOPICAL_CREAM | CUTANEOUS | Status: DC | PRN

## 2016-10-03 MED ORDER — LABETALOL HCL 5 MG/ML IV SOLN
INTRAVENOUS | Status: AC
  Filled 2016-10-03: qty 4

## 2016-10-03 MED ORDER — MIDAZOLAM HCL 2 MG/2ML IJ SOLN
INTRAMUSCULAR | Status: AC
  Filled 2016-10-03: qty 2

## 2016-10-03 MED ORDER — HEPARIN SODIUM (PORCINE) 1000 UNIT/ML DIALYSIS
1000.0000 [IU] | INTRAMUSCULAR | Status: DC | PRN
  Filled 2016-10-03: qty 1

## 2016-10-03 MED ORDER — PROPOFOL 10 MG/ML IV BOLUS
INTRAVENOUS | Status: DC | PRN
  Administered 2016-10-03: 150 mg via INTRAVENOUS

## 2016-10-03 MED ORDER — HEPARIN SODIUM (PORCINE) 5000 UNIT/ML IJ SOLN
INTRAMUSCULAR | Status: DC | PRN
  Administered 2016-10-03: 08:00:00

## 2016-10-03 MED ORDER — ONDANSETRON HCL 4 MG/2ML IJ SOLN
INTRAMUSCULAR | Status: DC | PRN
  Administered 2016-10-03: 4 mg via INTRAVENOUS

## 2016-10-03 MED ORDER — OXYCODONE HCL 5 MG PO TABS
5.0000 mg | ORAL_TABLET | Freq: Four times a day (QID) | ORAL | Status: DC | PRN
  Administered 2016-10-04 (×2): 5 mg via ORAL
  Filled 2016-10-03 (×2): qty 1

## 2016-10-03 MED ORDER — HYDRALAZINE HCL 50 MG PO TABS: 50.0000 mg | ORAL_TABLET | Freq: Three times a day (TID) | ORAL | Status: DC

## 2016-10-03 SURGICAL SUPPLY — 58 items
ARMBAND PINK RESTRICT EXTREMIT (MISCELLANEOUS) ×3 IMPLANT
BAG DECANTER FOR FLEXI CONT (MISCELLANEOUS) IMPLANT
BIOPATCH RED 1 DISK 7.0 (GAUZE/BANDAGES/DRESSINGS) IMPLANT
CANISTER SUCT 3000ML PPV (MISCELLANEOUS) ×3 IMPLANT
CANNULA VESSEL 3MM 2 BLNT TIP (CANNULA) ×3 IMPLANT
CATH PALINDROME RT-P 15FX19CM (CATHETERS) IMPLANT
CATH PALINDROME RT-P 15FX23CM (CATHETERS) IMPLANT
CATH PALINDROME RT-P 15FX28CM (CATHETERS) IMPLANT
CATH PALINDROME RT-P 15FX55CM (CATHETERS) IMPLANT
CATH STRAIGHT 5FR 65CM (CATHETERS) IMPLANT
CHLORAPREP W/TINT 26ML (MISCELLANEOUS) IMPLANT
CLIP TI MEDIUM 6 (CLIP) ×3 IMPLANT
CLIP TI WIDE RED SMALL 6 (CLIP) ×3 IMPLANT
COVER PROBE W GEL 5X96 (DRAPES) ×3 IMPLANT
COVER SURGICAL LIGHT HANDLE (MISCELLANEOUS) ×3 IMPLANT
DECANTER SPIKE VIAL GLASS SM (MISCELLANEOUS) IMPLANT
DERMABOND ADVANCED (GAUZE/BANDAGES/DRESSINGS) ×1
DERMABOND ADVANCED .7 DNX12 (GAUZE/BANDAGES/DRESSINGS) ×2 IMPLANT
DRAIN PENROSE 1/4X12 LTX STRL (WOUND CARE) ×3 IMPLANT
DRAPE C-ARM 42X72 X-RAY (DRAPES) IMPLANT
DRAPE CHEST BREAST 15X10 FENES (DRAPES) IMPLANT
ELECT REM PT RETURN 9FT ADLT (ELECTROSURGICAL) ×3
ELECTRODE REM PT RTRN 9FT ADLT (ELECTROSURGICAL) ×2 IMPLANT
GAUZE SPONGE 4X4 16PLY XRAY LF (GAUZE/BANDAGES/DRESSINGS) IMPLANT
GLOVE BIO SURGEON STRL SZ 6.5 (GLOVE) ×6 IMPLANT
GLOVE BIO SURGEON STRL SZ7.5 (GLOVE) ×3 IMPLANT
GLOVE BIOGEL PI IND STRL 6.5 (GLOVE) ×8 IMPLANT
GLOVE BIOGEL PI INDICATOR 6.5 (GLOVE) ×4
GLOVE SURG SS PI 6.0 STRL IVOR (GLOVE) ×3 IMPLANT
GLOVE SURG SS PI 6.5 STRL IVOR (GLOVE) ×3 IMPLANT
GOWN STRL REUS W/ TWL LRG LVL3 (GOWN DISPOSABLE) ×8 IMPLANT
GOWN STRL REUS W/TWL LRG LVL3 (GOWN DISPOSABLE) ×4
KIT BASIN OR (CUSTOM PROCEDURE TRAY) ×3 IMPLANT
KIT ROOM TURNOVER OR (KITS) ×3 IMPLANT
LOOP VESSEL MINI RED (MISCELLANEOUS) IMPLANT
NEEDLE 18GX1X1/2 (RX/OR ONLY) (NEEDLE) IMPLANT
NEEDLE HYPO 25GX1X1/2 BEV (NEEDLE) IMPLANT
NS IRRIG 1000ML POUR BTL (IV SOLUTION) ×3 IMPLANT
PACK CV ACCESS (CUSTOM PROCEDURE TRAY) ×3 IMPLANT
PACK SURGICAL SETUP 50X90 (CUSTOM PROCEDURE TRAY) IMPLANT
PAD ARMBOARD 7.5X6 YLW CONV (MISCELLANEOUS) ×6 IMPLANT
SET MICROPUNCTURE 5F STIFF (MISCELLANEOUS) IMPLANT
SPONGE SURGIFOAM ABS GEL 100 (HEMOSTASIS) IMPLANT
SUT ETHILON 3 0 PS 1 (SUTURE) IMPLANT
SUT PROLENE 7 0 BV 1 (SUTURE) ×3 IMPLANT
SUT SILK 0 FSL (SUTURE) IMPLANT
SUT VIC AB 3-0 SH 27 (SUTURE) ×1
SUT VIC AB 3-0 SH 27X BRD (SUTURE) ×2 IMPLANT
SUT VICRYL 4-0 PS2 18IN ABS (SUTURE) ×3 IMPLANT
SYR 10ML LL (SYRINGE) IMPLANT
SYR 20CC LL (SYRINGE) IMPLANT
SYR 5ML LL (SYRINGE) IMPLANT
SYR CONTROL 10ML LL (SYRINGE) IMPLANT
TOWEL OR 17X24 6PK STRL BLUE (TOWEL DISPOSABLE) IMPLANT
TOWEL OR 17X26 10 PK STRL BLUE (TOWEL DISPOSABLE) ×3 IMPLANT
UNDERPAD 30X30 (UNDERPADS AND DIAPERS) ×3 IMPLANT
WATER STERILE IRR 1000ML POUR (IV SOLUTION) ×3 IMPLANT
WIRE AMPLATZ SS-J .035X180CM (WIRE) IMPLANT

## 2016-10-03 NOTE — Progress Notes (Signed)
Patient is has IV access in right arm, refusing for IV team to place new IV in left arm at this time, IV team states that OR can place one if necessary. Patient also refusing to sign consent for the AVF/graft placement until she speaks with the MD about need for the procedure.

## 2016-10-03 NOTE — Op Note (Signed)
Procedure: Left Radial Cephalic AV fistula   Preop: ESRD   Postop: ESRD   Anesthesia: General  Assistant:  Doreatha MassedSamantha Rhyne, PA-c   Findings: 2.5 mm cephalic vein       2.5 mm radial artery   Procedure Details:  After administering general anesthesia, the left upper extremity was prepped and draped in usual sterile fashion. e vein was not palpable due to the patients obesity. Ultrasound was used to identify the location of the vein. This seemed of reasonable caliber for fistula creation.  A longitudinal skin incision was then made in this location at the distal left forearm. The incision was carried into the subcutaneous tissues down to level cephalic vein. The vein had some spasm but was overall reasonable quality about 2.5 mm. The vein was dissected free circumferentially and small side branches ligated and divided between silk ties. This was gently distended with heparinized saline, spatulated, and marked for orientation. Next the radial artery was dissected free in the medial portion incision. The artery was 2.5 mm in diameter but had a reasonable pulse. The vessel loops were placed proximal and distal to the planned site of arteriotomy. The patient was given 5000 units of intravenous heparin. After appropriate circulation time, small bulldog clamps were used to control the artery. A longitudinal opening was made in the left radial artery.  The vein was then swung over to the artery and sewn end of vein to side of artery using a running 7-0 Prolene suture. Just prior to completion, the anastomosis was fore bled back bled and thoroughly flushed. The anastomosis was secured, vessel loops released, and there was a palpable thrill in the fistula immediately. After hemostasis was obtained, the subcutaneous tissues were reapproximated using a running 3-0 Vicryl suture. The skin was then closed with a 4 0 Vicryl subcuticular stitch. Dermabond was applied to the skin incision. The patient tolerated the  procedure well and there were no complications. Instrument sponge and needle count were correct at the end of the case. The patient was taken to PACU in stable condition.   Fabienne Brunsharles Fields, MD  [facility] [place]  Office: [phone]  Pager: [phone]

## 2016-10-03 NOTE — Telephone Encounter (Signed)
-----   Message from Sharee PimpleMarilyn K McChesney, RN sent at 10/03/2016  9:23 AM EDT -----   ----- Message ----- From: Dara Lordshyne, Samantha J, PA-C Sent: 10/03/2016   9:13 AM To: Vvs Charge Pool  S/p left RC AVF 10/03/16.  F/u with CEF in 4-6 weeks with duplex.  Thanks, Lelon MastSamantha

## 2016-10-03 NOTE — Telephone Encounter (Signed)
LVM on home #, mailed lttr for appt on 7/5

## 2016-10-03 NOTE — Anesthesia Procedure Notes (Signed)
Procedure Name: LMA Insertion Date/Time: 10/03/2016 7:50 AM Performed by: Orlinda BlalockMCMILLEN, Robinn Overholt L Pre-anesthesia Checklist: Patient identified, Emergency Drugs available, Suction available and Patient being monitored Patient Re-evaluated:Patient Re-evaluated prior to inductionOxygen Delivery Method: Circle System Utilized Preoxygenation: Pre-oxygenation with 100% oxygen Intubation Type: IV induction Ventilation: Mask ventilation without difficulty LMA: LMA inserted LMA Size: 4.0 Number of attempts: 1 Airway Equipment and Method: Bite block Placement Confirmation: positive ETCO2 Tube secured with: Tape Dental Injury: Teeth and Oropharynx as per pre-operative assessment

## 2016-10-03 NOTE — Transfer of Care (Signed)
Immediate Anesthesia Transfer of Care Note  Patient: Marcia Thornton  Procedure(s) Performed: Procedure(s): LEFT ARM ARTERIOVENOUS (AV) FISTULA CREATION (Left)  Patient Location: PACU  Anesthesia Type:General  Level of Consciousness: awake, alert , oriented and patient cooperative  Airway & Oxygen Therapy: Patient Spontanous Breathing  Post-op Assessment: Report given to RN, Post -op Vital signs reviewed and stable and Patient moving all extremities  Post vital signs: Reviewed and stable  Last Vitals:  Vitals:   10/03/16 0630 10/03/16 0923  BP: (!) 170/113   Pulse:    Resp:    Temp:  36.8 C    Last Pain:  Vitals:   10/03/16 0405  TempSrc: Axillary  PainSc:       Patients Stated Pain Goal: 0 (09/28/16 1600)  Complications: No apparent anesthesia complications

## 2016-10-03 NOTE — H&P (View-Only) (Signed)
   VASCULAR SURGERY ASSESSMENT & PLAN:   I have reviewed her vein map. The results are discussed below. Based on this, I think her best option for fistula would be a right radiocephalic fistula or a right basilic vein transposition. Her surgery is scheduled for Friday. We'll also place a tunneled dialysis catheter at that time. I will have her IV in the right arm removed.   SUBJECTIVE:   The patient was sleeping when I saw her this morning.  PHYSICAL EXAM:   Vitals:   10/01/16 0930 10/01/16 0940 10/01/16 0943 10/01/16 1134  BP: (!) 169/135 (!) 174/118 (!) 163/110 (!) 150/100  Pulse: 89 87  80  Resp: 20 (!) 21  18  Temp:    97.7 F (36.5 C)  TempSrc:    Axillary  SpO2: 100% 93%  97%  Weight:       She has a palpable right radial pulse.  LABS:   VEIN MAP: I reviewed the patient's vein map. On the left side the forearm and upper arm cephalic vein looks small and is likely not usable. The basilic vein on the left is also small. On the right side the forearm cephalic vein might potentially be usable for fistula. The basilic vein on the right looks reasonable in size. I suspect that the forearm cephalic vein empties into the basilic system on the right as the upper arm cephalic vein is very small.   Lab Results  Component Value Date   WBC 10.2 09/30/2016   HGB 7.3 (L) 09/30/2016   HCT 21.2 (L) 09/30/2016   MCV 83.5 09/30/2016   PLT 231 09/30/2016   Lab Results  Component Value Date   CREATININE 10.15 (H) 09/30/2016   Lab Results  Component Value Date   INR 1.12 09/28/2016   CBG (last 3)   Recent Labs  09/29/16 0000 09/29/16 0429 09/29/16 0808  GLUCAP 134* 106* 98    PROBLEM LIST:    Active Problems:   Hypertensive emergency   CURRENT MEDS:   . amLODipine  10 mg Oral Daily  . carvedilol  25 mg Oral BID WC  . cloNIDine  0.2 mg Oral TID  . darbepoetin (ARANESP) injection - DIALYSIS  150 mcg Intravenous Q Wed-HD  . furosemide  40 mg Oral BID  . heparin   5,000 Units Subcutaneous Q8H  . hydrALAZINE  50 mg Oral Q8H  . nitroGLYCERIN      . pantoprazole  40 mg Oral Q1200    Cari CarawayChris Higinio Grow Beeper: [phone] Office: [phone] 10/01/2016

## 2016-10-03 NOTE — Anesthesia Postprocedure Evaluation (Signed)
Anesthesia Post Note  Patient: Marcia Thornton  Procedure(s) Performed: Procedure(s) (LRB): LEFT ARM ARTERIOVENOUS (AV) FISTULA CREATION (Left)  Patient location during evaluation: PACU Anesthesia Type: General Level of consciousness: awake and alert and oriented Pain management: pain level controlled Vital Signs Assessment: post-procedure vital signs reviewed and stable Respiratory status: spontaneous breathing, nonlabored ventilation, respiratory function stable and patient connected to nasal cannula oxygen Cardiovascular status: blood pressure returned to baseline and stable Postop Assessment: no signs of nausea or vomiting Anesthetic complications: no       Last Vitals:  Vitals:   10/03/16 0930 10/03/16 0938  BP:  (!) 178/113  Pulse: 88 87  Resp: (!) 21 14  Temp:      Last Pain:  Vitals:   10/03/16 0405  TempSrc: Axillary  PainSc:                  Marcia Thornton A.

## 2016-10-03 NOTE — Interval H&P Note (Signed)
History and Physical Interval Note:  10/03/2016 7:30 AM  Marcia Thornton  has presented today for surgery, with the diagnosis of end stage renal disease  The various methods of treatment have been discussed with the patient and family. After consideration of risks, benefits and other options for treatment, the patient has consented to  Procedure(s): ARTERIOVENOUS (AV) FISTULA CREATION VS GRAFT (N/A) INSERTION OF DIALYSIS CATHETER (N/A) as a surgical intervention .  The patient's history has been reviewed, patient examined, no change in status, stable for surgery.  I have reviewed the patient's chart and labs.  Questions were answered to the patient's satisfaction.  I reviewed pt vein map.  Veins are quite marginal bilaterally.  Will look at vein in OR but most likely left arm AV graft.  Risk benefit procedure details complications discusssed including but not limited to bleeding infection ischemic steal 1-2%, graft thrombosis 25% 1 year.   Fabienne BrunsFields, Lenni Reckner

## 2016-10-03 NOTE — Progress Notes (Signed)
[facility] ROUNDING NOTE   Subjective:   Interval History:  Interval History:36 year old female with past medical history of chronic kidney disease stage 5, hypertension and medical noncompliance was admitted from [facility] for hypertensive emergency. Subsequently she was [id number]. She initially refused dialysis although now she states she will do it, She is planned for  AVF this morning will plan dialysis today after procedure    Objective:  Vital signs in last 24 hours:  Temp:  [98.2 F (36.8 C)-99.2 F (37.3 C)] 99.2 F (37.3 C) (05/25 0405) Pulse Rate:  [34-101] 100 (05/25 0530) Resp:  [14-37] 15 (05/25 0530) BP: (151-220)/(103-130) 170/113 (05/25 0630) SpO2:  [89 %-100 %] 96 % (05/25 0530) Weight:  [158 lb 11.7 oz (72 kg)-163 lb 5.8 oz (74.1 kg)] 158 lb 11.7 oz (72 kg) (05/25 0500)  Weight change: -6 lb 9.8 oz (-3 kg) Filed Weights   10/02/16 1524 10/02/16 1736 10/03/16 0500  Weight: 163 lb 5.8 oz (74.1 kg) 158 lb 11.7 oz (72 kg) 158 lb 11.7 oz (72 kg)    Intake/Output: I/O last 3 completed shifts: In: 780 [P.O.:780] Out: 3000 [Urine:2000; Other:1000]   Intake/Output this shift:  No intake/output data recorded.  CVS- RRR RS- CTA ABD- BS present soft non-distended EXT- no edema   Basic Metabolic Panel:  Recent Labs Lab 09/27/16 2012 09/28/16 0519 09/29/16 0559 09/30/16 0245 10/02/16 1312  NA 139 138 137 135 138  K 3.3* 3.5 3.8 3.5 3.8  CL 104 104 103 101 102  CO2 20* 22 22 22   --   GLUCOSE 104* 128* 103* 105* 118*  BUN 76* 77* 81* 83* 82*  CREATININE 9.75* 9.75* 9.72* 10.15* 11.30*  CALCIUM 8.3* 7.9* 8.0* 7.8*  --   MG 2.2 2.1 2.0  --   --   PHOS 7.0* 7.3* 5.9*  --   --     Liver Function Tests:  Recent Labs Lab 09/27/16 2012 09/30/16 0245  AST 16 12*  ALT 19 13*  ALKPHOS 63 52  BILITOT 0.8 0.5  PROT 6.4* 5.3*  ALBUMIN 3.2* 2.5*   No results for input(s): LIPASE, AMYLASE in the last 168  hours. No results for input(s): AMMONIA in the last 168 hours.  CBC:  Recent Labs Lab 09/27/16 2012 09/28/16 0519 09/29/16 2035 09/30/16 0245 10/02/16 1312 10/02/16 1319  WBC 14.1* 11.3*  --  10.2  --  13.5*  HGB 7.6* 6.2* 7.2* 7.3* 8.8* 9.4*  HCT 21.6* 17.7* 20.8* 21.2* 26.0* 27.8*  MCV 80.3 81.2  --  83.5  --  83.2  PLT 212 195  --  231  --  340    Cardiac Enzymes:  Recent Labs Lab 09/27/16 2012 10/01/16 1031 10/01/16 1453 10/01/16 2207  TROPONINI 0.08* 0.03* 0.03* <0.03    BNP: Invalid input(s): POCBNP  CBG:  Recent Labs Lab 09/28/16 1634 09/28/16 2051 09/29/16 0000 09/29/16 0429 09/29/16 0808  GLUCAP 125* 118* 134* 106* 98    Microbiology: Results for orders placed or performed during the hospital encounter of 09/27/16  MRSA PCR Screening     Status: None   Collection Time: 09/27/16  8:01 PM  Result Value Ref Range Status   MRSA by PCR NEGATIVE NEGATIVE Final    Comment:        The GeneXpert MRSA Assay (FDA approved for NASAL specimens only), is one component of a comprehensive MRSA colonization surveillance program. It is not intended to diagnose MRSA infection nor to guide  or monitor treatment for MRSA infections.     Coagulation Studies: No results for input(s): LABPROT, INR in the last 72 hours.  Urinalysis: No results for input(s): COLORURINE, LABSPEC, PHURINE, GLUCOSEU, HGBUR, BILIRUBINUR, KETONESUR, PROTEINUR, UROBILINOGEN, NITRITE, LEUKOCYTESUR in the last 72 hours.  Invalid input(s): APPERANCEUR    Imaging: US Abdomen Complete  Result Date: 10/01/2016 CLINICAL DATA:  Chronic kidney disease, nausea and vomiting for 10 days EXAM: ABDOMEN ULTRASOUND COMPLETE COMPARISON:  None. FINDINGS: Gallbladder: No gallstones or wall thickening visualized. No sonographic Murphy sign noted by sonographer. Common bile duct: Diameter: 4.2 mm Liver: No focal lesion identified. Within normal limits in parenchymal echogenicity. IVC: No abnormality  visualized. Pancreas: Visualized portion unremarkable. Spleen: Size and appearance within normal limits. Right Kidney: Length: 8.9 cm. Increased cortical echogenicity. No hydronephrosis. Left Kidney: Length: 10.1 cm. Increased cortical echogenicity. No hydronephrosis. Abdominal aorta: No aneurysm visualized. Other findings: Incidental note made of small left pleural effusion IMPRESSION: 1. Negative for gallstones or biliary dilatation 2. Echogenic kidneys consistent with medical renal disease. No hydronephrosis 3. Incidental trace left pleural effusion Electronically Signed   By: Jasmine Pang M.D.   On: 10/01/2016 22:25   Dg Chest Port 1 View  Result Date: 10/02/2016 CLINICAL DATA:  Dialysis catheter insertion EXAM: PORTABLE CHEST 1 VIEW COMPARISON:  09/27/2016 FINDINGS: Stable cardiomegaly. Low volumes with atelectasis greater on the left. Permacatheter on the right with tip at the upper right atrium. No pneumothorax. IMPRESSION: 1. No acute finding after permacatheter placement. Tip is at the upper cavoatrial junction. 2. Chronic cardiomegaly. 3. Low volume chest with atelectasis. Electronically Signed   By: Marnee Spring M.D.   On: 10/02/2016 15:19   Dg Fluoro Guide Cv Line-no Report  Result Date: 10/02/2016 Fluoroscopy was utilized by the requesting physician.  No radiographic interpretation.     Medications:   . [MAR Hold] sodium chloride    . [MAR Hold] sodium chloride    . lactated ringers     . [MAR Hold] amLODipine  10 mg Oral Daily  . [MAR Hold] carvedilol  25 mg Oral BID WC  . [MAR Hold] cloNIDine  0.3 mg Transdermal Weekly  . [MAR Hold] darbepoetin (ARANESP) injection - DIALYSIS  150 mcg Subcutaneous Q Wed  . [MAR Hold] furosemide  40 mg Intravenous Q12H  . [MAR Hold] heparin  5,000 Units Subcutaneous Q8H  . [MAR Hold] hydrALAZINE  50 mg Oral Q8H  . [MAR Hold] pantoprazole (PROTONIX) IV  40 mg Intravenous Q24H   [MAR Hold] sodium chloride, [MAR Hold] sodium chloride, 0.9 %  irrigation (POUR BTL), [MAR Hold] acetaminophen, [MAR Hold] acetaminophen, [MAR Hold] alteplase, [MAR Hold] cloNIDine, fentaNYL (SUBLIMAZE) injection, heparin 6000 unit irrigation, [MAR Hold] heparin, [MAR Hold] hydrALAZINE, [MAR Hold] labetalol, [MAR Hold] lidocaine (PF), [MAR Hold] lidocaine-prilocaine, [MAR Hold] LORazepam, [MAR Hold] nitroGLYCERIN, [MAR Hold] ondansetron (ZOFRAN) IV, [MAR Hold] pentafluoroprop-tetrafluoroeth, [MAR Hold] promethazine  Assessment/ Plan:  1 Acute on CKD IV - no old labs here, but per pt baseline creat is around 5. Discussed with her nephrologist in [place] He states that she is non compliant with follow up and has refused blood work . He states he has only seen her in the office a couple of times. She has refused dialysis here however when I signed off, she told her hospitalists that she would do dialysis. This morning she is sleeping and talked to Ed, her significant other, he states she is just anxious but will do dialysis We shall continue dialysis .  I have advised CLIP process to start  2 Vol overload/ hx pulm edema last week at outside facility - improved clinically  3 HTN - long standing, severe, on 4 oral BP meds now. Added transdermal clonidine  4 Anemia due to CKD - transfused    LOS: 6 [place] Crance W @TODAY @8 :47 AM

## 2016-10-03 NOTE — Progress Notes (Signed)
PROGRESS NOTE    Marcia Rogueiffany Thornton  ZOX:[id number] DOB: 09/23/1980 DOA: 09/27/2016 PCP: No primary care provider on file.   Brief Narrative:   36 year old female with past medical history of chronic kidney disease, hypertension and medical noncompliance [facility]. Subsequently she was transferred for possible HD and hypertensive urgency. She developed uremic symptoms starting on 5/23, and an urgent right IJ hd catheter placed by vascular today.  Plan for HD as per nephrology.   Assessment & Plan:   Active Problems:   Hypertensive emergency  Acute on chronic CKD; unknown stage previously -Currently she is agreeable for dialysis. Nephrology following -She will require dialysis access. Preference deferred to nephrology -Potassium is normal at this time. But her creatinine continues to rise along with BUN -Closely monitor urine output. - vascular consulted by nephrology for vein mapping and for HD catheter placement.  - she developed uremic symptoms with persistent nausea, vomiting .  - urgent placement of right IJ HD catheter on 5/24. Plan for HD as per renal .  - she also underwent left radial cephalic AV fistula on 5/25.  - currently she denies any chest pain, nausea or vomiting or headache.    Hypertensive emergency -Blood pressure remains elevated as she is not able to take any po. Changed most of her bp meds to IV, patch. Resume lasix, clonidine, coreg, norvasc, labetalol and hydralazine. Once she is able to take po, we will resume her oral meds.  - her bp is down to 140/100 mmhg after restarting her meds.  -She will need dialysis, pt agreeable.   Acute hypoxic respiratory failure secondary to pulmonary edema -Currently has improved. Supplemental oxygen as needed -Dialysis should help her - resume LASIX. Changed to IV as she was not able to tolerate it po.  - has diuresed about 5 liters since admission.     Anemia chronic  kidney disease -Continue to monitor hemoglobin -Iron studies done on 09/28/2016 - hemoglobin around 7. S/p  prbc transfusion.   - repeat hemoglobin around 9.    Medical noncompliance Discussed with the patient by multiple providers and she is agreeable to HD and blood draws..   Chest pain: substernal non radiating, occurred at rest, EKG shows normal sinus rhythm, prolonged QT, t wave abnormality and lateral ischemia. troponins sent, ECHO, and repeat EKG unchanged.  Will request cardiology consultation if echo abnormal.  Pt refused echocardiogram. No more chest pain today.   Nausea. Vomiting: secondary to uremia.  Mild abd discomfort.  US abdomen ordered, does not show any acute pathology.  Symptoms have improved today.  Plan for HD this afternoon.      DVT prophylaxis: heparin.  Code Status: Full Family Communication:  None at bedside.  Disposition Plan: Maintain stepdown at this time  Consultants:   Nephrology  Vascular surgery.   Procedures:   Right IJ catheter on 5/23  Left radial cephalic AV fistula on 5/24.   Antimicrobials:   None    Subjective: No nausea, vomiting.  No chest pain  No headaches.  .   Objective: Vitals:   10/03/16 0630 10/03/16 0923 10/03/16 0930 10/03/16 0938  BP: (!) 170/113 (!) 182/119  (!) 178/113  Pulse:  85 88 87  Resp:  16 (!) 21 14  Temp:  98.3 F (36.8 C)    TempSrc:      SpO2:  93% 90% 99%  Weight:        Intake/Output Summary (Last 24 hours) at 10/03/16  1152 Last data filed at 10/03/16 0908  Gross per 24 hour  Intake              680 ml  Output             1410 ml  Net             -730 ml   Filed Weights   10/02/16 1524 10/02/16 1736 10/03/16 0500  Weight: 74.1 kg (163 lb 5.8 oz) 72 kg (158 lb 11.7 oz) 72 kg (158 lb 11.7 oz)    Examination:  General exam: comfortable.  Respiratory system: Clear to auscultation. Respiratory effort normal. Diminished at bases.  Cardiovascular system: S1 & S2 heard, RRR. No  JVD, murmurs, rubs, gallops or clicks.trace pedal edema. . Gastrointestinal system: Abdomen is nondistended, soft and nontender. No organomegaly or masses felt. Normal bowel sounds heard. Central nervous system: Alert and oriented. No focal neurological deficits. Extremities: Symmetric 5 x 5 power. Skin: No rashes, lesions or ulcers     Data Reviewed:   CBC:  Recent Labs Lab 09/27/16 2012 09/28/16 0519 09/29/16 2035 09/30/16 0245 10/02/16 1312 10/02/16 1319  WBC 14.1* 11.3*  --  10.2  --  13.5*  HGB 7.6* 6.2* 7.2* 7.3* 8.8* 9.4*  HCT 21.6* 17.7* 20.8* 21.2* 26.0* 27.8*  MCV 80.3 81.2  --  83.5  --  83.2  PLT 212 195  --  231  --  340   Basic Metabolic Panel:  Recent Labs Lab 09/27/16 2012 09/28/16 0519 09/29/16 0559 09/30/16 0245 10/02/16 1312  NA 139 138 137 135 138  K 3.3* 3.5 3.8 3.5 3.8  CL 104 104 103 101 102  CO2 20* 22 22 22   --   GLUCOSE 104* 128* 103* 105* 118*  BUN 76* 77* 81* 83* 82*  CREATININE 9.75* 9.75* 9.72* 10.15* 11.30*  CALCIUM 8.3* 7.9* 8.0* 7.8*  --   MG 2.2 2.1 2.0  --   --   PHOS 7.0* 7.3* 5.9*  --   --    GFR: CrCl cannot be calculated (Unknown ideal weight.). Liver Function Tests:  Recent Labs Lab 09/27/16 2012 09/30/16 0245  AST 16 12*  ALT 19 13*  ALKPHOS 63 52  BILITOT 0.8 0.5  PROT 6.4* 5.3*  ALBUMIN 3.2* 2.5*   No results for input(s): LIPASE, AMYLASE in the last 168 hours. No results for input(s): AMMONIA in the last 168 hours. Coagulation Profile:  Recent Labs Lab 09/28/16 0519  INR 1.12   Cardiac Enzymes:  Recent Labs Lab 09/27/16 2012 10/01/16 1031 10/01/16 1453 10/01/16 2207  TROPONINI 0.08* 0.03* 0.03* <0.03   BNP (last 3 results) No results for input(s): PROBNP in the last 8760 hours. HbA1C: No results for input(s): HGBA1C in the last 72 hours. CBG:  Recent Labs Lab 09/28/16 1634 09/28/16 2051 09/29/16 0000 09/29/16 0429 09/29/16 0808  GLUCAP 125* 118* 134* 106* 98   Lipid Profile: No  results for input(s): CHOL, HDL, LDLCALC, TRIG, CHOLHDL, LDLDIRECT in the last 72 hours. Thyroid Function Tests: No results for input(s): TSH, T4TOTAL, FREET4, T3FREE, THYROIDAB in the last 72 hours. Anemia Panel:  Recent Labs  10/01/16 1453  FERRITIN 93   Sepsis Labs:  Recent Labs Lab 09/27/16 2012 09/27/16 2017 09/27/16 2345 09/28/16 0519 09/29/16 0559  PROCALCITON 0.73  --   --  0.86 0.84  LATICACIDVEN  --  0.6 0.6  --   --     Recent Results (from the past 240 hour(s))  MRSA PCR Screening     Status: None   Collection Time: 09/27/16  8:01 PM  Result Value Ref Range Status   MRSA by PCR NEGATIVE NEGATIVE Final    Comment:        The GeneXpert MRSA Assay (FDA approved for NASAL specimens only), is one component of a comprehensive MRSA colonization surveillance program. It is not intended to diagnose MRSA infection nor to guide or monitor treatment for MRSA infections.          Radiology Studies: US Abdomen Complete  Result Date: 10/01/2016 CLINICAL DATA:  Chronic kidney disease, nausea and vomiting for 10 days EXAM: ABDOMEN ULTRASOUND COMPLETE COMPARISON:  None. FINDINGS: Gallbladder: No gallstones or wall thickening visualized. No sonographic Murphy sign noted by sonographer. Common bile duct: Diameter: 4.2 mm Liver: No focal lesion identified. Within normal limits in parenchymal echogenicity. IVC: No abnormality visualized. Pancreas: Visualized portion unremarkable. Spleen: Size and appearance within normal limits. Right Kidney: Length: 8.9 cm. Increased cortical echogenicity. No hydronephrosis. Left Kidney: Length: 10.1 cm. Increased cortical echogenicity. No hydronephrosis. Abdominal aorta: No aneurysm visualized. Other findings: Incidental note made of small left pleural effusion IMPRESSION: 1. Negative for gallstones or biliary dilatation 2. Echogenic kidneys consistent with medical renal disease. No hydronephrosis 3. Incidental trace left pleural effusion  Electronically Signed   By: Jasmine Pang M.D.   On: 10/01/2016 22:25   Dg Chest Port 1 View  Result Date: 10/02/2016 CLINICAL DATA:  Dialysis catheter insertion EXAM: PORTABLE CHEST 1 VIEW COMPARISON:  09/27/2016 FINDINGS: Stable cardiomegaly. Low volumes with atelectasis greater on the left. Permacatheter on the right with tip at the upper right atrium. No pneumothorax. IMPRESSION: 1. No acute finding after permacatheter placement. Tip is at the upper cavoatrial junction. 2. Chronic cardiomegaly. 3. Low volume chest with atelectasis. Electronically Signed   By: Marnee Spring M.D.   On: 10/02/2016 15:19   Dg Fluoro Guide Cv Line-no Report  Result Date: 10/02/2016 Fluoroscopy was utilized by the requesting physician.  No radiographic interpretation.        Scheduled Meds: . amLODipine  10 mg Oral Daily  . carvedilol  25 mg Oral BID WC  . cloNIDine  0.3 mg Transdermal Weekly  . [START ON 10/08/2016] darbepoetin (ARANESP) injection - DIALYSIS  150 mcg Subcutaneous Q Wed  . furosemide  40 mg Intravenous Q12H  . heparin  5,000 Units Subcutaneous Q8H  . hydrALAZINE  50 mg Oral Q8H  . pantoprazole (PROTONIX) IV  40 mg Intravenous Q24H   Continuous Infusions: . sodium chloride    . sodium chloride       LOS: 6 days    Time spent: 35 mins     Haiven Nardone, MD [facility] Pager [phone]  If 7PM-7AM, please contact night-coverage [email or web address] Password [facility] 10/03/2016, 11:52 AM

## 2016-10-04 ENCOUNTER — Encounter (HOSPITAL_COMMUNITY): Payer: Self-pay | Admitting: Vascular Surgery

## 2016-10-04 LAB — RENAL FUNCTION PANEL
ALBUMIN: 3.3 g/dL — AB (ref 3.5–5.0)
Anion gap: 13 (ref 5–15)
BUN: 52 mg/dL — AB (ref 6–20)
CALCIUM: 8.7 mg/dL — AB (ref 8.9–10.3)
CO2: 24 mmol/L (ref 22–32)
CREATININE: 7.39 mg/dL — AB (ref 0.44–1.00)
Chloride: 94 mmol/L — ABNORMAL LOW (ref 101–111)
GFR calc Af Amer: 7 mL/min — ABNORMAL LOW (ref >60)
GFR calc non Af Amer: 6 mL/min — ABNORMAL LOW (ref >60)
GLUCOSE: 99 mg/dL (ref 65–99)
Phosphorus: 5.7 mg/dL — ABNORMAL HIGH (ref 2.5–4.6)
Potassium: 3.6 mmol/L (ref 3.5–5.1)
SODIUM: 131 mmol/L — AB (ref 135–145)

## 2016-10-04 LAB — CBC
HCT: 24.5 % — ABNORMAL LOW (ref 36.0–46.0)
HEMOGLOBIN: 8.3 g/dL — AB (ref 12.0–15.0)
MCH: 28.1 pg (ref 26.0–34.0)
MCHC: 33.9 g/dL (ref 30.0–36.0)
MCV: 83.1 fL (ref 78.0–100.0)
Platelets: 355 10*3/uL (ref 150–400)
RBC: 2.95 MIL/uL — ABNORMAL LOW (ref 3.87–5.11)
RDW: 13.5 % (ref 11.5–15.5)
WBC: 13.4 10*3/uL — ABNORMAL HIGH (ref 4.0–10.5)

## 2016-10-04 LAB — HCG, QUANTITATIVE, PREGNANCY: HCG, BETA CHAIN, QUANT, S: 2 m[IU]/mL (ref <5)

## 2016-10-04 MED ORDER — PANTOPRAZOLE SODIUM 40 MG PO TBEC
40.0000 mg | DELAYED_RELEASE_TABLET | Freq: Every day | ORAL | Status: DC
  Administered 2016-10-04 – 2016-10-08 (×4): 40 mg via ORAL
  Filled 2016-10-04 (×5): qty 1

## 2016-10-04 MED ORDER — POLYETHYLENE GLYCOL 3350 17 G PO PACK
17.0000 g | PACK | Freq: Every day | ORAL | Status: DC
  Administered 2016-10-04 – 2016-10-11 (×3): 17 g via ORAL
  Filled 2016-10-04 (×6): qty 1

## 2016-10-04 MED ORDER — OXYCODONE HCL 5 MG PO TABS
10.0000 mg | ORAL_TABLET | Freq: Four times a day (QID) | ORAL | Status: DC | PRN
  Administered 2016-10-05 – 2016-10-07 (×3): 10 mg via ORAL
  Filled 2016-10-04 (×6): qty 2

## 2016-10-04 MED ORDER — HYDRALAZINE HCL 50 MG PO TABS
100.0000 mg | ORAL_TABLET | Freq: Three times a day (TID) | ORAL | Status: DC
  Administered 2016-10-05 – 2016-10-11 (×13): 100 mg via ORAL
  Filled 2016-10-04 (×19): qty 2

## 2016-10-04 MED ORDER — LIDOCAINE-PRILOCAINE 2.5-2.5 % EX CREA: 1.0000 "application " | TOPICAL_CREAM | CUTANEOUS | Status: DC | PRN

## 2016-10-04 MED ORDER — ONDANSETRON HCL 4 MG/2ML IJ SOLN
INTRAMUSCULAR | Status: AC
Start: 2016-10-04 — End: 2016-10-04
  Filled 2016-10-04: qty 2

## 2016-10-04 MED ORDER — SENNOSIDES-DOCUSATE SODIUM 8.6-50 MG PO TABS
2.0000 | ORAL_TABLET | Freq: Two times a day (BID) | ORAL | Status: DC
  Administered 2016-10-04 – 2016-10-11 (×12): 2 via ORAL
  Filled 2016-10-04 (×17): qty 2

## 2016-10-04 MED ORDER — PENTAFLUOROPROP-TETRAFLUOROETH EX AERO: 1.0000 "application " | INHALATION_SPRAY | CUTANEOUS | Status: DC | PRN

## 2016-10-04 MED ORDER — ALTEPLASE 2 MG IJ SOLR: 2.0000 mg | Freq: Once | INTRAMUSCULAR | Status: DC | PRN

## 2016-10-04 MED ORDER — ACETAMINOPHEN 325 MG PO TABS
ORAL_TABLET | ORAL | Status: AC
  Filled 2016-10-04: qty 2

## 2016-10-04 MED ORDER — SODIUM CHLORIDE 0.9 % IV SOLN: 100.0000 mL | INTRAVENOUS | Status: DC | PRN

## 2016-10-04 MED ORDER — HEPARIN SODIUM (PORCINE) 1000 UNIT/ML DIALYSIS
1000.0000 [IU] | INTRAMUSCULAR | Status: DC | PRN
  Filled 2016-10-04: qty 1

## 2016-10-04 MED ORDER — LIDOCAINE HCL (PF) 1 % IJ SOLN
5.0000 mL | INTRAMUSCULAR | Status: DC | PRN
  Filled 2016-10-04: qty 5

## 2016-10-04 MED ORDER — LABETALOL HCL 200 MG PO TABS
200.0000 mg | ORAL_TABLET | Freq: Two times a day (BID) | ORAL | Status: DC
  Administered 2016-10-05 – 2016-10-07 (×6): 200 mg via ORAL
  Filled 2016-10-04 (×7): qty 1

## 2016-10-04 MED ORDER — DARBEPOETIN ALFA 150 MCG/0.3ML IJ SOSY: 150.0000 ug | PREFILLED_SYRINGE | INTRAMUSCULAR | Status: DC

## 2016-10-04 NOTE — Progress Notes (Signed)
  Postoperative hemodialysis access     Date of Surgery:  10/03/16 Surgeon: Darrick PennaFields  Subjective:  Sleeping but awakes easily; denies pain in her hand.  PHYSICAL EXAMINATION:  Vitals:   10/04/16 0400 10/04/16 0500  BP: (!) 180/113 (!) 149/96  Pulse: 87 90  Resp: (!) 22 18  Temp: 98.7 F (37.1 C)     Incision is clean and dry Sensation in digits is intact;  There is  Thrill  There is bruit. The graft/fistula is palpable; the left hand is warm; good hand grip.   ASSESSMENT/PLAN:  Marcia Thornton is a 36 y.o. year old female who is s/p left radial cephalic AVF.  -graft/fistula is patent -pt does not have evidence of steal sx -f/u with Dr. Darrick PennaFields in 4-6 weeks to check maturation of AVF ([place] maturity of fistula) -will sign off-call as needed.   Doreatha MassedSamantha Elizah Lydon, PA-C [facility] [phone]

## 2016-10-04 NOTE — Progress Notes (Signed)
PROGRESS NOTE    Marcia Thornton  WUJ:[id number] DOB: [birth year] DOA: 09/27/2016 PCP: No primary care provider on file.   Brief Narrative:   36 year old female with past medical history of chronic kidney disease, hypertension and medical noncompliance was admitted from [facility] for hypertensive emergency. Subsequently she was transferred for possible HD and hypertensive urgency. She developed uremic symptoms starting on 5/23, and an urgent right IJ hd catheter placed by vascular today.  Plan for HD as per nephrology.   Assessment & Plan:   Active Problems:   Hypertensive emergency  Acute on chronic CKD; unknown stage previously -Currently she is agreeable for dialysis. Nephrology following -Potassium is normal at this time. But her creatinine continues to rise along with BUN -Closely monitor urine output. - vascular consulted by nephrology for vein mapping and for HD catheter placement.  - she developed uremic symptoms with persistent nausea, vomiting . Which have slightly improved. - urgent placement of right IJ HD catheter on 5/24. Plan for HD as per renal .  - she also underwent left radial cephalic AV fistula on 5/25.  - currently she denies any chest pain, nausea or vomiting or headache.  - plan for HD today   Hypertensive emergency -bp remains elevated on IV lasix, clonidine patch, amlodipine, hydralazine and labetalol po.   - hope HD will decrease the bp.  - increased hydralazine to 100 mg TID.    Acute hypoxic respiratory failure secondary to pulmonary edema -Currently has improved. Supplemental oxygen as needed -Dialysis should help her - resume LASIX. Changed to IV as she was not able to tolerate it po.  - has diuresed about 7.3 liters since admission.     Anemia chronic kidney disease -Continue to monitor hemoglobin -Iron studies done on 09/28/2016 - hemoglobin around 7. S/p  prbc transfusion.   - repeat hemoglobin around 9.    Medical  noncompliance Discussed with the patient by multiple providers and she is agreeable to HD and blood draws..   Chest pain: substernal non radiating, occurred at rest, EKG shows normal sinus rhythm, prolonged QT, t wave abnormality and lateral ischemia. troponins sent, ECHO, and repeat EKG unchanged.  Will request cardiology consultation if echo abnormal.  Pt refused echocardiogram. No more chest pain today.   Nausea. Vomiting: secondary to uremia.  Mild abd discomfort.  US abdomen ordered, does not show any acute pathology.  Symptoms have improved today.  Plan for HD this afternoon.   Back pain:  appears muskulo skeletal. Added oxy and increased to 10 mg every 6 hours.   Elevated  I stat quantitative HCG: Minimally elevated, pt states she is not pregnant,  Will get repeat quanti hcg and get US pelvis for further eval.    DVT prophylaxis: heparin.  Code Status: Full Family Communication:  Partner at bedside.  Disposition Plan: Maintain stepdown at this time  Consultants:   Nephrology  Vascular surgery.   Procedures:   Right IJ catheter on 5/23  Left radial cephalic AV fistula on 5/24.   Antimicrobials:   None    Subjective: Reports back pain.  .   Objective: Vitals:   10/04/16 0800 10/04/16 0903 10/04/16 1300 10/04/16 1600  BP: (!) 175/106 (!) 192/129 (!) 162/117   Pulse: 90  85   Resp: (!) 29  17   Temp: 98.7 F (37.1 C)  98.2 F (36.8 C) 98.6 F (37 C)  TempSrc: Oral  Oral Oral  SpO2: 97%  99%   Weight:  Intake/Output Summary (Last 24 hours) at 10/04/16 1712 Last data filed at 10/04/16 0930  Gross per 24 hour  Intake                0 ml  Output              600 ml  Net             -600 ml   Filed Weights   10/03/16 0500 10/03/16 1442 10/04/16 0500  Weight: 72 kg (158 lb 11.7 oz) 72 kg (158 lb 11.7 oz) 71.8 kg (158 lb 4.6 oz)    Examination:  General exam: comfortable.  Respiratory system: Clear to auscultation. Respiratory effort  normal. Diminished at bases.  Cardiovascular system: S1 & S2 heard, RRR. No JVD, murmurs, rubs, gallops or clicks.trace pedal edema. . Gastrointestinal system: Abdomen is nondistended, soft and nontender. No organomegaly or masses felt. Normal bowel sounds heard. Central nervous system: Alert and oriented. No focal neurological deficits. Extremities: Symmetric 5 x 5 power. Skin: No rashes, lesions or ulcers     Data Reviewed:   CBC:  Recent Labs Lab 09/27/16 2012 09/28/16 0519 09/29/16 2035 09/30/16 0245 10/02/16 1312 10/02/16 1319  WBC 14.1* 11.3*  --  10.2  --  13.5*  HGB 7.6* 6.2* 7.2* 7.3* 8.8* 9.4*  HCT 21.6* 17.7* 20.8* 21.2* 26.0* 27.8*  MCV 80.3 81.2  --  83.5  --  83.2  PLT 212 195  --  231  --  340   Basic Metabolic Panel:  Recent Labs Lab 09/27/16 2012 09/28/16 0519 09/29/16 0559 09/30/16 0245 10/02/16 1312  NA 139 138 137 135 138  K 3.3* 3.5 3.8 3.5 3.8  CL 104 104 103 101 102  CO2 20* 22 22 22   --   GLUCOSE 104* 128* 103* 105* 118*  BUN 76* 77* 81* 83* 82*  CREATININE 9.75* 9.75* 9.72* 10.15* 11.30*  CALCIUM 8.3* 7.9* 8.0* 7.8*  --   MG 2.2 2.1 2.0  --   --   PHOS 7.0* 7.3* 5.9*  --   --    GFR: CrCl cannot be calculated (Unknown ideal weight.). Liver Function Tests:  Recent Labs Lab 09/27/16 2012 09/30/16 0245  AST 16 12*  ALT 19 13*  ALKPHOS 63 52  BILITOT 0.8 0.5  PROT 6.4* 5.3*  ALBUMIN 3.2* 2.5*   No results for input(s): LIPASE, AMYLASE in the last 168 hours. No results for input(s): AMMONIA in the last 168 hours. Coagulation Profile:  Recent Labs Lab 09/28/16 0519  INR 1.12   Cardiac Enzymes:  Recent Labs Lab 09/27/16 2012 10/01/16 1031 10/01/16 1453 10/01/16 2207  TROPONINI 0.08* 0.03* 0.03* <0.03   BNP (last 3 results) No results for input(s): PROBNP in the last 8760 hours. HbA1C: No results for input(s): HGBA1C in the last 72 hours. CBG:  Recent Labs Lab 09/28/16 1634 09/28/16 2051 09/29/16 0000  09/29/16 0429 09/29/16 0808  GLUCAP 125* 118* 134* 106* 98   Lipid Profile: No results for input(s): CHOL, HDL, LDLCALC, TRIG, CHOLHDL, LDLDIRECT in the last 72 hours. Thyroid Function Tests: No results for input(s): TSH, T4TOTAL, FREET4, T3FREE, THYROIDAB in the last 72 hours. Anemia Panel: No results for input(s): VITAMINB12, FOLATE, FERRITIN, TIBC, IRON, RETICCTPCT in the last 72 hours. Sepsis Labs:  Recent Labs Lab 09/27/16 2012 09/27/16 2017 09/27/16 2345 09/28/16 0519 09/29/16 0559  PROCALCITON 0.73  --   --  0.86 0.84  LATICACIDVEN  --  0.6 0.6  --   --  Recent Results (from the past 240 hour(s))  MRSA PCR Screening     Status: None   Collection Time: 09/27/16  8:01 PM  Result Value Ref Range Status   MRSA by PCR NEGATIVE NEGATIVE Final    Comment:        The GeneXpert MRSA Assay (FDA approved for NASAL specimens only), is one component of a comprehensive MRSA colonization surveillance program. It is not intended to diagnose MRSA infection nor to guide or monitor treatment for MRSA infections.          Radiology Studies: No results found.      Scheduled Meds: . amLODipine  10 mg Oral Daily  . cloNIDine  0.3 mg Transdermal Weekly  . [START ON 10/08/2016] darbepoetin (ARANESP) injection - DIALYSIS  150 mcg Intravenous Q Wed-HD  . furosemide  40 mg Intravenous Q12H  . heparin  5,000 Units Subcutaneous Q8H  . hydrALAZINE  100 mg Oral Q8H  . labetalol  200 mg Oral BID  . pantoprazole  40 mg Oral q1800  . polyethylene glycol  17 g Oral Daily  . senna-docusate  2 tablet Oral BID   Continuous Infusions:    LOS: 7 days    Time spent: 35 mins     Angelos Wasco, MD [facility] Pager [phone]  If 7PM-7AM, please contact night-coverage [email or web address] Password [facility] 10/04/2016, 5:12 PM

## 2016-10-04 NOTE — Progress Notes (Signed)
[facility] ROUNDING NOTE   Subjective:   Interval History: Interval History:36 year old female with past medical history of chronic kidney disease stage 5, hypertension and medical noncompliance was [facility]. Subsequently she was transferred [place]. She initially refused dialysis although now she states she will do it, She will have dialysis today --- she had a permcath placed yesterday 5/24  5/25     Objective:  Vital signs in last 24 hours:  Temp:  [98 F (36.7 C)-98.8 F (37.1 C)] 98.7 F (37.1 C) (05/26 0800) Pulse Rate:  [81-95] 90 (05/26 0800) Resp:  [14-29] 29 (05/26 0800) BP: (140-192)/(90-129) 192/129 (05/26 0903) SpO2:  [95 %-99 %] 97 % (05/26 0800) Weight:  [158 lb 4.6 oz (71.8 kg)-158 lb 11.7 oz (72 kg)] 158 lb 4.6 oz (71.8 kg) (05/26 0500)  Weight change: -4 lb 10.1 oz (-2.1 kg) Filed Weights   10/03/16 0500 10/03/16 1442 10/04/16 0500  Weight: 158 lb 11.7 oz (72 kg) 158 lb 11.7 oz (72 kg) 158 lb 4.6 oz (71.8 kg)    Intake/Output: I/O last 3 completed shifts: In: 680 [P.O.:480; I.V.:200] Out: 2410 [Urine:400; Other:2000; Blood:10]   Intake/Output this shift:  No intake/output data recorded.  CVS- RRR RS- CTA ABD- BS present soft non-distended EXT- no edema   Basic Metabolic Panel:  Recent Labs Lab 09/27/16 2012 09/28/16 0519 09/29/16 0559 09/30/16 0245 10/02/16 1312  NA 139 138 137 135 138  K 3.3* 3.5 3.8 3.5 3.8  CL 104 104 103 101 102  CO2 20* 22 22 22   --   GLUCOSE 104* 128* 103* 105* 118*  BUN 76* 77* 81* 83* 82*  CREATININE 9.75* 9.75* 9.72* 10.15* 11.30*  CALCIUM 8.3* 7.9* 8.0* 7.8*  --   MG 2.2 2.1 2.0  --   --   PHOS 7.0* 7.3* 5.9*  --   --     Liver Function Tests:  Recent Labs Lab 09/27/16 2012 09/30/16 0245  AST 16 12*  ALT 19 13*  ALKPHOS 63 52  BILITOT 0.8 0.5  PROT 6.4* 5.3*  ALBUMIN 3.2* 2.5*   No results for input(s): LIPASE,  AMYLASE in the last 168 hours. No results for input(s): AMMONIA in the last 168 hours.  CBC:  Recent Labs Lab 09/27/16 2012 09/28/16 0519 09/29/16 2035 09/30/16 0245 10/02/16 1312 10/02/16 1319  WBC 14.1* 11.3*  --  10.2  --  13.5*  HGB 7.6* 6.2* 7.2* 7.3* 8.8* 9.4*  HCT 21.6* 17.7* 20.8* 21.2* 26.0* 27.8*  MCV 80.3 81.2  --  83.5  --  83.2  PLT 212 195  --  231  --  340    Cardiac Enzymes:  Recent Labs Lab 09/27/16 2012 10/01/16 1031 10/01/16 1453 10/01/16 2207  TROPONINI 0.08* 0.03* 0.03* <0.03    BNP: Invalid input(s): POCBNP  CBG:  Recent Labs Lab 09/28/16 1634 09/28/16 2051 09/29/16 0000 09/29/16 0429 09/29/16 0808  GLUCAP 125* 118* 134* 106* 98    Microbiology: Results for orders placed or performed during the hospital encounter of 09/27/16  MRSA PCR Screening     Status: None   Collection Time: 09/27/16  8:01 PM  Result Value Ref Range Status   MRSA by PCR NEGATIVE NEGATIVE Final    Comment:        The GeneXpert MRSA Assay (FDA approved for NASAL specimens only), is one component of a comprehensive MRSA colonization surveillance program. It is not intended to diagnose MRSA infection  nor to guide or monitor treatment for MRSA infections.     Coagulation Studies: No results for input(s): LABPROT, INR in the last 72 hours.  Urinalysis: No results for input(s): COLORURINE, LABSPEC, PHURINE, GLUCOSEU, HGBUR, BILIRUBINUR, KETONESUR, PROTEINUR, UROBILINOGEN, NITRITE, LEUKOCYTESUR in the last 72 hours.  Invalid input(s): APPERANCEUR    Imaging: Dg Chest Port 1 View  Result Date: 10/02/2016 CLINICAL DATA:  Dialysis catheter insertion EXAM: PORTABLE CHEST 1 VIEW COMPARISON:  09/27/2016 FINDINGS: Stable cardiomegaly. Low volumes with atelectasis greater on the left. Permacatheter on the right with tip at the upper right atrium. No pneumothorax. IMPRESSION: 1. No acute finding after permacatheter placement. Tip is at the upper cavoatrial  junction. 2. Chronic cardiomegaly. 3. Low volume chest with atelectasis. Electronically Signed   By: Marnee Spring M.D.   On: 10/02/2016 15:19   Dg Fluoro Guide Cv Line-no Report  Result Date: 10/02/2016 Fluoroscopy was utilized by the requesting physician.  No radiographic interpretation.     Medications:    . amLODipine  10 mg Oral Daily  . carvedilol  25 mg Oral BID WC  . cloNIDine  0.3 mg Transdermal Weekly  . [START ON 10/08/2016] darbepoetin (ARANESP) injection - DIALYSIS  150 mcg Subcutaneous Q Wed  . furosemide  40 mg Intravenous Q12H  . heparin  5,000 Units Subcutaneous Q8H  . hydrALAZINE  50 mg Oral Q8H  . pantoprazole (PROTONIX) IV  40 mg Intravenous Q24H   acetaminophen, acetaminophen, cloNIDine, hydrALAZINE, LORazepam, nitroGLYCERIN, ondansetron (ZOFRAN) IV, oxyCODONE, promethazine  Assessment/ Plan:  1 Acute on CKD IV - no old labs here, but per pt baseline creat is around 5. Discussed with her nephrologist in [place] He states that she is non compliant with follow up and has refused blood work . He states he has only seen her in the office a couple of times. She has refused dialysis here however when I signed off, she told her hospitalists that she would do dialysis. This morning she is sleeping and talked to Ed, her significant other, he states she is just anxious but will do dialysis We shall continue dialysis . I have advised CLIP process to start  She had successful dialysis 5/24 and 5/25  We shall plan today 5/26  2 Vol overload/ hx pulm edema last week at outside facility - improved clinically  3 HTN - long standing, severe, on 4 oral BP meds now. Added transdermal clonidine   Change carvedilol to labetalol 200mg  BID 4 Anemia due to CKD - transfused      LOS: 7 Leiland Mihelich W @TODAY @10 :55 AM

## 2016-10-04 NOTE — Progress Notes (Signed)
Dr. Blake DivineAkula on floor to speak to patient. BP 158/118. Instructed to give prn dose of hydralazine.

## 2016-10-05 ENCOUNTER — Encounter (HOSPITAL_COMMUNITY): Payer: Self-pay | Admitting: *Deleted

## 2016-10-05 LAB — PREGNANCY, URINE: Preg Test, Ur: NEGATIVE

## 2016-10-05 NOTE — Progress Notes (Signed)
PROGRESS NOTE    Marcia Thornton  GQQ:[id number] DOB: [birth year] DOA: 09/27/2016 PCP: No primary care provider on file.   Brief Narrative:   36 year old female with past medical history of chronic kidney disease, hypertension and medical noncompliance was admitted from [facility] for hypertensive emergency. Subsequently she was transferred for possible HD and hypertensive urgency. She developed uremic symptoms starting on 5/23, and an urgent right IJ hd catheter placed by vascular today.  Plan for HD as per nephrology.   Assessment & Plan:   Active Problems:   Hypertensive emergency  Acute on chronic CKD; unknown stage previously Currently undergoing HD started on 5/25, 5/26. Nephrology following. - vascular consulted by nephrology for vein mapping and for HD catheter placement.  - urgent placement of right IJ HD catheter on 5/24. Plan for HD as per renal .  - she also underwent left radial cephalic AV fistula on 5/25.  - currently she denies any chest pain, nausea or vomiting or headache.     Hypertensive emergency Much better bp parameters today.  currently on IV lasix, clonidine patch, amlodipine, hydralazine and labetalol po.   - hope HD will decrease the bp.  - increased hydralazine to 100 mg TID on 5/26 , last bp is 140/90 mmhg. .    Acute hypoxic respiratory failure secondary to pulmonary edema -Currently has improved. Supplemental oxygen as needed -Dialysis should help her - resume LASIX. Changed to IV as she was not able to tolerate it po.  - has diuresed about 10 liters since admission.     Anemia chronic kidney disease -Continue to monitor hemoglobin -Iron studies done on 09/28/2016 - hemoglobin around 7. S/p  prbc transfusion.   - repeat hemoglobin around 9.    Medical noncompliance Discussed with the patient by multiple providers and she is agreeable to HD and blood draws..   Chest pain: substernal non radiating, occurred at rest, EKG shows normal  sinus rhythm, prolonged QT, t wave abnormality and lateral ischemia. troponins sent, ECHO, and repeat EKG unchanged.  Will request cardiology consultation if echo abnormal.  Pt refused echocardiogram. No more chest pain today.   Nausea. Vomiting: secondary to uremia.  Mild abd discomfort.  US abdomen ordered, does not show any acute pathology.  Symptoms have improved today.    Back pain:  appears muskulo skeletal. Added oxy and increased to 10 mg every 6 hours.   Elevated  I stat quantitative HCG: Minimally elevated, pt states she is not pregnant,  Repeat test is negative.    DVT prophylaxis: heparin.  Code Status: Full Family Communication:  Partner at bedside.  Disposition Plan: Maintain stepdown at this time  Consultants:   Nephrology  Vascular surgery.   Procedures:   Right IJ catheter on 5/23  Left radial cephalic AV fistula on 5/24.   Antimicrobials:   None    Subjective: Back pain is better.  .   Objective: Vitals:   10/05/16 0736 10/05/16 0900 10/05/16 1034 10/05/16 1100  BP:  (!) 149/90 (!) 156/102 (!) 154/107  Pulse: 87 86 91 84  Resp: 18 17  20   Temp: 98.2 F (36.8 C)     TempSrc: Oral     SpO2: 99% 96%  100%  Weight:        Intake/Output Summary (Last 24 hours) at 10/05/16 1131 Last data filed at 10/05/16 0937  Gross per 24 hour  Intake                0 ml  Output             2700 ml  Net            -2700 ml   Filed Weights   10/04/16 1848 10/04/16 2148 10/05/16 0428  Weight: 72 kg (158 lb 11.7 oz) 69.8 kg (153 lb 14.1 oz) 69.8 kg (153 lb 14.1 oz)    Examination:  General exam: comfortable.  Respiratory system: Clear to auscultation. Respiratory effort normal. Diminished at bases.  Cardiovascular system: S1 & S2 heard, RRR. No JVD, murmurs, rubs, gallops or clicks.trace pedal edema. . Gastrointestinal system: Abdomen is nondistended, soft and nontender. No organomegaly or masses felt. Normal bowel sounds heard. Central nervous  system: Alert and oriented. No focal neurological deficits. Extremities: Symmetric 5 x 5 power. Skin: No rashes, lesions or ulcers     Data Reviewed:   CBC:  Recent Labs Lab 09/29/16 2035 09/30/16 0245 10/02/16 1312 10/02/16 1319 10/04/16 1912  WBC  --  10.2  --  13.5* 13.4*  HGB 7.2* 7.3* 8.8* 9.4* 8.3*  HCT 20.8* 21.2* 26.0* 27.8* 24.5*  MCV  --  83.5  --  83.2 83.1  PLT  --  231  --  340 355   Basic Metabolic Panel:  Recent Labs Lab 09/29/16 0559 09/30/16 0245 10/02/16 1312 10/04/16 1911  NA 137 135 138 131*  K 3.8 3.5 3.8 3.6  CL 103 101 102 94*  CO2 22 22  --  24  GLUCOSE 103* 105* 118* 99  BUN 81* 83* 82* 52*  CREATININE 9.72* 10.15* 11.30* 7.39*  CALCIUM 8.0* 7.8*  --  8.7*  MG 2.0  --   --   --   PHOS 5.9*  --   --  5.7*   GFR: CrCl cannot be calculated (Unknown ideal weight.). Liver Function Tests:  Recent Labs Lab 09/30/16 0245 10/04/16 1911  AST 12*  --   ALT 13*  --   ALKPHOS 52  --   BILITOT 0.5  --   PROT 5.3*  --   ALBUMIN 2.5* 3.3*   No results for input(s): LIPASE, AMYLASE in the last 168 hours. No results for input(s): AMMONIA in the last 168 hours. Coagulation Profile: No results for input(s): INR, PROTIME in the last 168 hours. Cardiac Enzymes:  Recent Labs Lab 10/01/16 1031 10/01/16 1453 10/01/16 2207  TROPONINI 0.03* 0.03* <0.03   BNP (last 3 results) No results for input(s): PROBNP in the last 8760 hours. HbA1C: No results for input(s): HGBA1C in the last 72 hours. CBG:  Recent Labs Lab 09/28/16 1634 09/28/16 2051 09/29/16 0000 09/29/16 0429 09/29/16 0808  GLUCAP 125* 118* 134* 106* 98   Lipid Profile: No results for input(s): CHOL, HDL, LDLCALC, TRIG, CHOLHDL, LDLDIRECT in the last 72 hours. Thyroid Function Tests: No results for input(s): TSH, T4TOTAL, FREET4, T3FREE, THYROIDAB in the last 72 hours. Anemia Panel: No results for input(s): VITAMINB12, FOLATE, FERRITIN, TIBC, IRON, RETICCTPCT in the last  72 hours. Sepsis Labs:  Recent Labs Lab 09/29/16 0559  PROCALCITON 0.84    Recent Results (from the past 240 hour(s))  MRSA PCR Screening     Status: None   Collection Time: 09/27/16  8:01 PM  Result Value Ref Range Status   MRSA by PCR NEGATIVE NEGATIVE Final    Comment:        The GeneXpert MRSA Assay (FDA approved for NASAL specimens only), is one component of a comprehensive MRSA colonization surveillance program. It is not intended  to diagnose MRSA infection nor to guide or monitor treatment for MRSA infections.          Radiology Studies: No results found.      Scheduled Meds: . amLODipine  10 mg Oral Daily  . cloNIDine  0.3 mg Transdermal Weekly  . [START ON 10/08/2016] darbepoetin (ARANESP) injection - DIALYSIS  150 mcg Intravenous Q Wed-HD  . furosemide  40 mg Intravenous Q12H  . heparin  5,000 Units Subcutaneous Q8H  . hydrALAZINE  100 mg Oral Q8H  . labetalol  200 mg Oral BID  . pantoprazole  40 mg Oral q1800  . polyethylene glycol  17 g Oral Daily  . senna-docusate  2 tablet Oral BID   Continuous Infusions: . sodium chloride    . sodium chloride       LOS: 8 days    Time spent: 35 mins     Tracer Gutridge, MD [facility] Pager [phone]  If 7PM-7AM, please contact night-coverage [email or web address] Password [email or web address] 10/05/2016, 11:31 AM

## 2016-10-05 NOTE — Progress Notes (Signed)
[name] ROUNDING NOTE   Subjective:   InInterval History:36 year old female with past medical history of chronic kidney disease stage 5, hypertension and medical noncompliance was [facility]. Subsequently she was transferred ZO:[place]ialysis. She initially refused dialysis although now she states she will do it, She will have dialysis  --- she had a permcath placed  5/24  and underwent dialysis 5/24  5/25 and 5/26 ( third dialysis ) . She will need to be CL[facility] [place] [facility] she has a nephrologist there.   Objective:  Vital signs in last 24 hours:  Temp:  [98 F (36.7 C)-98.6 F (37 C)] 98.2 F (36.8 C) (05/27 0736) Pulse Rate:  [84-101] 87 (05/27 0736) Resp:  [16-23] 18 (05/27 0736) BP: (142-192)/(90-129) 154/111 (05/27 0407) SpO2:  [97 %-99 %] 99 % (05/27 0736) Weight:  [153 lb 14.1 oz (69.8 kg)-158 lb 11.7 oz (72 kg)] 153 lb 14.1 oz (69.8 kg) (05/27 0428)  Weight change: 0 lb (0 kg) Filed Weights   10/04/16 1848 10/04/16 2148 10/05/16 0428  Weight: 158 lb 11.7 oz (72 kg) 153 lb 14.1 oz (69.8 kg) 153 lb 14.1 oz (69.8 kg)    Intake/Output: I/O last 3 completed shifts: In: -  Out: 3100 [Urine:600; Other:2500]   Intake/Output this shift:  No intake/output data recorded.  CVS- RRR RS- CTA ABD- BS present soft non-distended EXT- no edema   Basic Metabolic Panel:  Recent Labs Lab 09/29/16 0559 09/30/16 0245 10/02/16 1312 10/04/16 1911  NA 137 135 138 131*  K 3.8 3.5 3.8 3.6  CL 103 101 102 94*  CO2 22 22  --  24  GLUCOSE 103* 105* 118* 99  BUN 81* 83* 82* 52*  CREATININE 9.72* 10.15* 11.30* 7.39*  CALCIUM 8.0* 7.8*  --  8.7*  MG 2.0  --   --   --   PHOS 5.9*  --   --  5.7*    Liver Function Tests:  Recent Labs Lab 09/30/16 0245 10/04/16 1911  AST 12*  --   ALT 13*  --   ALKPHOS 52  --   BILITOT 0.5  --   PROT 5.3*  --   ALBUMIN 2.5* 3.3*   No results for input(s):  LIPASE, AMYLASE in the last 168 hours. No results for input(s): AMMONIA in the last 168 hours.  CBC:  Recent Labs Lab 09/29/16 2035 09/30/16 0245 10/02/16 1312 10/02/16 1319 10/04/16 1912  WBC  --  10.2  --  13.5* 13.4*  HGB 7.2* 7.3* 8.8* 9.4* 8.3*  HCT 20.8* 21.2* 26.0* 27.8* 24.5*  MCV  --  83.5  --  83.2 83.1  PLT  --  231  --  340 355    Cardiac Enzymes:  Recent Labs Lab 10/01/16 1031 10/01/16 1453 10/01/16 2207  TROPONINI 0.03* 0.03* <0.03    BNP: Invalid input(s): POCBNP  CBG:  Recent Labs Lab 09/28/16 1634 09/28/16 2051 09/29/16 0000 09/29/16 0429 09/29/16 0808  GLUCAP 125* 118* 134* 106* 98    Microbiology: Results for orders placed or performed during the hospital encounter of 09/27/16  MRSA PCR Screening     Status: None   Collection Time: 09/27/16  8:01 PM  Result Value Ref Range Status   MRSA by PCR NEGATIVE NEGATIVE Final    Comment:        The GeneXpert MRSA Assay (FDA approved for NASAL specimens only), is one component of a comprehensive MRSA colonization surveillance program.  It is not intended to diagnose MRSA infection nor to guide or monitor treatment for MRSA infections.     Coagulation Studies: No results for input(s): LABPROT, INR in the last 72 hours.  Urinalysis: No results for input(s): COLORURINE, LABSPEC, PHURINE, GLUCOSEU, HGBUR, BILIRUBINUR, KETONESUR, PROTEINUR, UROBILINOGEN, NITRITE, LEUKOCYTESUR in the last 72 hours.  Invalid input(s): APPERANCEUR    Imaging: No results found.   Medications:   . sodium chloride    . sodium chloride     . amLODipine  10 mg Oral Daily  . cloNIDine  0.3 mg Transdermal Weekly  . [START ON 10/08/2016] darbepoetin (ARANESP) injection - DIALYSIS  150 mcg Intravenous Q Wed-HD  . furosemide  40 mg Intravenous Q12H  . heparin  5,000 Units Subcutaneous Q8H  . hydrALAZINE  100 mg Oral Q8H  . labetalol  200 mg Oral BID  . pantoprazole  40 mg Oral q1800  . polyethylene glycol   17 g Oral Daily  . senna-docusate  2 tablet Oral BID   sodium chloride, sodium chloride, acetaminophen, acetaminophen, alteplase, cloNIDine, heparin, hydrALAZINE, lidocaine (PF), lidocaine-prilocaine, LORazepam, nitroGLYCERIN, ondansetron (ZOFRAN) IV, oxyCODONE, pentafluoroprop-tetrafluoroeth, promethazine  Assessment/ Plan:  1 Acute on CKD IV - no old labs here, but per pt baseline creat is around 5. Discussed with her nephrologist in [place] He states that she is non compliant with follow up and has refused blood work . He states he has only seen her in the office a couple of times. She has refused dialysis here however when I signed off, she told her hospitalists that she would do dialysis. This morning she appearing improved   We shall continue dialysis . I have advised CLIP process to start  last Friday She had successful dialysis 5/24 and 5/25 and 5/26  2 Vol overload/ hx pulm edema last week at outside facility - improved clinically  3 HTN - long standing, severe, on 4 oral BP meds now. Added transdermal clonidine   Change carvedilol to labetalol 200mg  BID  Her response has been quite satisfactory 4 Anemia due to CKD - transfused and darbepoietin given  Hb 8.3   LOS: 8 Marcia Thornton W @TODAY @8 :57 AM

## 2016-10-05 NOTE — Progress Notes (Signed)
Patient transferred to 5W06. Report given to receiving nurse [facility]. All questions answered. Patient's friend at bedside. All belongings transferred with patient. Patient denied any pain or discomfort at transfer.

## 2016-10-06 ENCOUNTER — Inpatient Hospital Stay (HOSPITAL_COMMUNITY): Payer: Medicaid - Out of State

## 2016-10-06 MED ORDER — SODIUM CHLORIDE 0.9 % IV SOLN
125.0000 mg | INTRAVENOUS | Status: DC
  Administered 2016-10-07: 125 mg via INTRAVENOUS
  Filled 2016-10-06 (×5): qty 10

## 2016-10-06 MED ORDER — METHOCARBAMOL 500 MG PO TABS
500.0000 mg | ORAL_TABLET | Freq: Three times a day (TID) | ORAL | Status: DC
  Administered 2016-10-06 – 2016-10-08 (×5): 500 mg via ORAL
  Filled 2016-10-06 (×5): qty 1

## 2016-10-06 MED ORDER — RENA-VITE PO TABS
1.0000 | ORAL_TABLET | Freq: Every day | ORAL | Status: DC
  Administered 2016-10-06 – 2016-10-11 (×5): 1 via ORAL
  Filled 2016-10-06 (×6): qty 1

## 2016-10-06 MED ORDER — CALCIUM ACETATE (PHOS BINDER) 667 MG PO CAPS
667.0000 mg | ORAL_CAPSULE | Freq: Three times a day (TID) | ORAL | Status: DC
  Administered 2016-10-06 – 2016-10-08 (×5): 667 mg via ORAL
  Filled 2016-10-06 (×5): qty 1

## 2016-10-06 MED ORDER — LABETALOL HCL 5 MG/ML IV SOLN
10.0000 mg | INTRAVENOUS | Status: DC | PRN
  Administered 2016-10-06 – 2016-10-07 (×2): 10 mg via INTRAVENOUS
  Filled 2016-10-06 (×4): qty 4

## 2016-10-06 MED ORDER — FUROSEMIDE 80 MG PO TABS
80.0000 mg | ORAL_TABLET | Freq: Every day | ORAL | Status: DC
  Administered 2016-10-06 – 2016-10-11 (×6): 80 mg via ORAL
  Filled 2016-10-06 (×7): qty 1

## 2016-10-06 NOTE — Progress Notes (Signed)
Obtained order for labetolol IV. Patient refused all medications this am. I stressed the importance of getting her BP down, and she said "not now". Will report this to oncoming nurse.

## 2016-10-06 NOTE — Progress Notes (Signed)
[facility] Progress Note  Patient: Marcia Thornton ZOX:[id number]   PCP: No primary care provider on file. DOB: 10/04/1980   DOA: 09/27/2016   DOS: 10/06/2016   Date of Service: the patient was seen and examined on 10/06/2016  Subjective: Patient complains about right flank pain. Also had episode of vomiting earlier. On my evaluation patient was not having any pain nor having any vomiting. Patient tells me that high blood pressure makes her vomit. Denies having any issues with any medication that she is ascending here in the hospital.  Brief hospital course: Pt. with PMH of CKD stage III, hypertension, medical noncompliance; admitted on 09/27/2016, presented with complaint of elevated blood pressure, was found to have hypertensive emergency. Patient was initially admitted and will Hospital and was transferred from there for requirement for hemodialysis. Nausea and vomiting were main complain and she was started on urgent hemodialysis with right IJ catheter. Clip process has been initiated. Currently further plan is continue hemodialysis.  Assessment and Plan: 1. Acute on chronic kidney disease. Currently ESRD. Agreeing for hemodialysis, patient will be clipped out in [place]. Having uremic symptoms with nausea and vomiting. Treat symptomatically. Appreciate nephrology input.  2. Right flank pain. Ultrasound renal unremarkable, lumbar spine x-ray unremarkable. At present I do not suspect that the patient has any organic etiology of her pain. At worst this could be musculoskeletal pain. Continue medical Robaxin. No indication for IV narcotics.  3. Hypertensive emergency. Patient noncompliant with medical regimen. Refusing antihypertensive medication multiple times due to complaints of nausea. Currently blood pressure elevated, recommend to continue taking her current medication.  4. Acute hypoxic respiratory failure. I'll overload probably acute diastolic dysfunction. Require  oxygen, currently I'll on room air. Dialysis is helping. Received IV Lasix, currently changed to by mouth. Monitor.  5. Chest pain. Currently pain-free, no abnormality refused echocardiogram.  Diet: Renal diet DVT Prophylaxis: subcutaneous Heparin  Advance goals of care discussion: Full code  Family Communication: family was present at bedside, at the time of interview. The pt provided permission to discuss medical plan with the family. Opportunity was given to ask question and all questions were answered satisfactorily.   Disposition:  Discharge to be decide.  Consultants: Nephrology, vascular surgery Procedures: HD  Antibiotics: Anti-infectives    Start     Dose/Rate Route Frequency Ordered Stop   10/03/16 0735  dextrose 5 % with cefUROXime (ZINACEF) ADS Med    Comments:  Ferol Luz   : cabinet override      10/03/16 0735 10/03/16 0755       Objective: Physical Exam: Vitals:   10/06/16 0511 10/06/16 0512 10/06/16 0848 10/06/16 1614  BP: (!) 153/105  (!) 168/104 134/82  Pulse: 99  85 85  Resp: 18   18  Temp: 98.7 F (37.1 C)   98.9 F (37.2 C)  TempSrc: Oral   Oral  SpO2: 99%   99%  Weight:  65.8 kg (145 lb)    Height:        Intake/Output Summary (Last 24 hours) at 10/06/16 1754 Last data filed at 10/06/16 1612  Gross per 24 hour  Intake              240 ml  Output             1110 ml  Net             -870 ml   Filed Weights   10/04/16 2148 10/05/16 0428 10/06/16 0512  Weight: 69.8 kg (  153 lb 14.1 oz) 69.8 kg (153 lb 14.1 oz) 65.8 kg (145 lb)   General: Alert, Awake and Oriented to Time, Place and Person. Appear in moderate distress, affect flat ENT: Oral Mucosa clear moist. Neck: difficult to assess JVD, no Abnormal Mass Or lumps Cardiovascular: S1 and S2 Present, no Murmur, Peripheral Pulses Present Respiratory: no respiratory effort, Bilateral Air entry equal and Decreased, no use of accessory muscle, Clear to Auscultation, no Crackles, no  wheezes Abdomen: Bowel Sound present, Soft and no tenderness,  Skin: no redness, no Rash, no induration Extremities: no Pedal edema, no calf tenderness Neurologic: Grossly no focal neuro deficit. Bilaterally Equal motor strength  Data Reviewed: CBC:  Recent Labs Lab 09/29/16 2035 09/30/16 0245 10/02/16 1312 10/02/16 1319 10/04/16 1912  WBC  --  10.2  --  13.5* 13.4*  HGB 7.2* 7.3* 8.8* 9.4* 8.3*  HCT 20.8* 21.2* 26.0* 27.8* 24.5*  MCV  --  83.5  --  83.2 83.1  PLT  --  231  --  340 355   Basic Metabolic Panel:  Recent Labs Lab 09/30/16 0245 10/02/16 1312 10/04/16 1911  NA 135 138 131*  K 3.5 3.8 3.6  CL 101 102 94*  CO2 22  --  24  GLUCOSE 105* 118* 99  BUN 83* 82* 52*  CREATININE 10.15* 11.30* 7.39*  CALCIUM 7.8*  --  8.7*  PHOS  --   --  5.7*    Liver Function Tests:  Recent Labs Lab 09/30/16 0245 10/04/16 1911  AST 12*  --   ALT 13*  --   ALKPHOS 52  --   BILITOT 0.5  --   PROT 5.3*  --   ALBUMIN 2.5* 3.3*   No results for input(s): LIPASE, AMYLASE in the last 168 hours. No results for input(s): AMMONIA in the last 168 hours. Coagulation Profile: No results for input(s): INR, PROTIME in the last 168 hours. Cardiac Enzymes:  Recent Labs Lab 10/01/16 1031 10/01/16 1453 10/01/16 2207  TROPONINI 0.03* 0.03* <0.03   BNP (last 3 results) No results for input(s): PROBNP in the last 8760 hours. CBG: No results for input(s): GLUCAP in the last 168 hours. Studies: Dg Lumbar Spine Complete  Result Date: 10/06/2016 CLINICAL DATA:  RIGHT flank pain and discomfort, kidney disease, recently started dialysis EXAM: LUMBAR SPINE - COMPLETE 4+ VIEW COMPARISON:  None FINDINGS: Five non-rib-bearing lumbar vertebra. Osseous mineralization normal. Vertebral body and disc space heights maintained. Short pedicles of L5. No acute fracture, subluxation, or bone destruction. No spondylolysis. SI joints symmetric. IMPRESSION: No acute osseous abnormalities.  Electronically Signed   By: Ulyses Southward M.D.   On: 10/06/2016 13:07   US Renal  Result Date: 10/06/2016 CLINICAL DATA:  Right flank pain for 1 week. History of hypertension chronic kidney disease. EXAM: RENAL / URINARY TRACT ULTRASOUND COMPLETE COMPARISON:  Abdominal ultrasound 10/01/2016. FINDINGS: Right Kidney: Length: 10.4 cm. There is diffusely increased cortical echogenicity as before. No focal lesion or hydronephrosis. Left Kidney: Length: 9.8 cm. There is diffusely increased cortical echogenicity as before. No focal lesion or hydronephrosis. Bladder: Mild nonspecific bladder wall thickening. Bilateral ureteral jets are noted. IMPRESSION: 1. No acute findings or hydronephrosis. 2. Echogenic kidneys consistent with chronic medical renal disease. 3. Nonspecific bladder wall thickening.  Correlate clinically. Electronically Signed   By: Carey Bullocks M.D.   On: 10/06/2016 13:37    Scheduled Meds: . amLODipine  10 mg Oral Daily  . calcium acetate  667 mg Oral TID WC  .  cloNIDine  0.3 mg Transdermal Weekly  . [START ON 10/08/2016] darbepoetin (ARANESP) injection - DIALYSIS  150 mcg Intravenous Q Wed-HD  . furosemide  80 mg Oral Daily  . heparin  5,000 Units Subcutaneous Q8H  . hydrALAZINE  100 mg Oral Q8H  . labetalol  200 mg Oral BID  . methocarbamol  500 mg Oral TID  . multivitamin  1 tablet Oral QHS  . pantoprazole  40 mg Oral q1800  . polyethylene glycol  17 g Oral Daily  . senna-docusate  2 tablet Oral BID   Continuous Infusions: . sodium chloride    . sodium chloride    . [START ON 10/07/2016] ferric gluconate (FERRLECIT/NULECIT) IV     PRN Meds: sodium chloride, sodium chloride, acetaminophen, acetaminophen, alteplase, cloNIDine, labetalol, lidocaine (PF), lidocaine-prilocaine, LORazepam, nitroGLYCERIN, ondansetron (ZOFRAN) IV, oxyCODONE, pentafluoroprop-tetrafluoroeth, promethazine  Time spent: 35 minutes  Author: Lynden OxfordPranav Draeden Kellman, MD [facility] Pager:  [phone] 10/06/2016 5:54 PM  If 7PM-7AM, please contact night-coverage at [email or web address], password [email or web address]

## 2016-10-06 NOTE — Progress Notes (Signed)
Patient complained of Nausea.  Emesis green/mucous measurered at 110 cc.  zofran IV given.  Will monitor for effectiveness

## 2016-10-06 NOTE — Progress Notes (Signed)
Patient ID: Marcia Thornton, female   DOB: 05/18/1980, 36 y.o.   MRN: [id number]  [facility] Progress Note   Assessment/ Plan:   1. Volume overload/hypertensive emergency: Continues to have intermittent problems with tolerance antihypertensive agents-elevated blood pressures noted this morning after she refused medications. Discussed options for management and risks of poorly controlled blood pressure. We'll convert her to oral furosemide from intravenous.  2. ESRD: New start to hemodialysis-process initiated for [place] dialysis [place]. Next hemodialysis tomorrow (while here, TTS). 3. Anemia: Status post PRBC transfusion, currently on Aranesp. Low iron saturation, begin intravenous iron 4. CKD-MBD: Mildly hyperphosphatemic on last labs-begin calcium acetate 3 times a day before meals.  5. Nutrition: Begin on renal vitamin  Subjective:   Emesis noted from overnight/earlier this morning-suspected to be from hydralazine.    Objective:   BP (!) 168/104 (BP Location: Right Arm)   Pulse 85   Temp 98.7 F (37.1 C) (Oral)   Resp 18   Ht 5\' 4"  (1.626 m)   Wt 65.8 kg (145 lb)   LMP 09/27/2016 (Exact Date) Comment: HCG of 6.7, but pt states she is not sexually active.  SpO2 99%   BMI 24.89 kg/m   Physical Exam: WUJ:[id number]:Appears to be comfortable-somnolent resting in bed CVS: Pulse regular rhythm, normal rate, S1 and S2 with ejection systolic murmur Resp: Clear to auscultation-no distinct rales Abd: Soft, obese, nontender Ext: Trace ankle edema  Labs: BMET  Recent Labs Lab 09/30/16 0245 10/02/16 1312 10/04/16 1911  NA 135 138 131*  K 3.5 3.8 3.6  CL 101 102 94*  CO2 22  --  24  GLUCOSE 105* 118* 99  BUN 83* 82* 52*  CREATININE 10.15* 11.30* 7.39*  CALCIUM 7.8*  --  8.7*  PHOS  --   --  5.7*   CBC  Recent Labs Lab 09/30/16 0245 10/02/16 1312 10/02/16 1319 10/04/16 1912  WBC 10.2  --  13.5* 13.4*  HGB 7.3* 8.8* 9.4* 8.3*  HCT  21.2* 26.0* 27.8* 24.5*  MCV 83.5  --  83.2 83.1  PLT 231  --  340 355    Medications:    . amLODipine  10 mg Oral Daily  . cloNIDine  0.3 mg Transdermal Weekly  . [START ON 10/08/2016] darbepoetin (ARANESP) injection - DIALYSIS  150 mcg Intravenous Q Wed-HD  . furosemide  40 mg Intravenous Q12H  . heparin  5,000 Units Subcutaneous Q8H  . hydrALAZINE  100 mg Oral Q8H  . labetalol  200 mg Oral BID  . pantoprazole  40 mg Oral q1800  . polyethylene glycol  17 g Oral Daily  . senna-docusate  2 tablet Oral BID     Zetta BillsJay Livan Hires, MD 10/06/2016, 9:50 AM

## 2016-10-06 NOTE — Progress Notes (Signed)
Patients B/P  153/106. Patient is nauseated. Feels like the hydralazine may have made her feel nauseated. Will contact MD  [id number] Smitty CordsBruce, Kayleah nauseated. Pos.for emesis,feels like the apresoline might factor. BP is 153/105. I was wondering about labetolol? Thank You  Awaiting call back.

## 2016-10-07 LAB — CBC
HEMATOCRIT: 23.3 % — AB (ref 36.0–46.0)
Hemoglobin: 8 g/dL — ABNORMAL LOW (ref 12.0–15.0)
MCH: 28 pg (ref 26.0–34.0)
MCHC: 34.3 g/dL (ref 30.0–36.0)
MCV: 81.5 fL (ref 78.0–100.0)
Platelets: 336 10*3/uL (ref 150–400)
RBC: 2.86 MIL/uL — AB (ref 3.87–5.11)
RDW: 13 % (ref 11.5–15.5)
WBC: 10.2 10*3/uL (ref 4.0–10.5)

## 2016-10-07 LAB — RENAL FUNCTION PANEL
Albumin: 3.3 g/dL — ABNORMAL LOW (ref 3.5–5.0)
Anion gap: 14 (ref 5–15)
BUN: 69 mg/dL — ABNORMAL HIGH (ref 6–20)
CHLORIDE: 92 mmol/L — AB (ref 101–111)
CO2: 23 mmol/L (ref 22–32)
Calcium: 8.5 mg/dL — ABNORMAL LOW (ref 8.9–10.3)
Creatinine, Ser: 8.58 mg/dL — ABNORMAL HIGH (ref 0.44–1.00)
GFR, EST AFRICAN AMERICAN: 6 mL/min — AB (ref >60)
GFR, EST NON AFRICAN AMERICAN: 5 mL/min — AB (ref >60)
Glucose, Bld: 105 mg/dL — ABNORMAL HIGH (ref 65–99)
POTASSIUM: 4 mmol/L (ref 3.5–5.1)
Phosphorus: 7.8 mg/dL — ABNORMAL HIGH (ref 2.5–4.6)
Sodium: 129 mmol/L — ABNORMAL LOW (ref 135–145)

## 2016-10-07 MED ORDER — DARBEPOETIN ALFA 150 MCG/0.3ML IJ SOSY
150.0000 ug | PREFILLED_SYRINGE | INTRAMUSCULAR | Status: DC
  Administered 2016-10-07: 150 ug via INTRAVENOUS
  Filled 2016-10-07: qty 0.3

## 2016-10-07 MED ORDER — DARBEPOETIN ALFA 150 MCG/0.3ML IJ SOSY
PREFILLED_SYRINGE | INTRAMUSCULAR | Status: AC
  Administered 2016-10-07: 150 ug via INTRAVENOUS
  Filled 2016-10-07: qty 0.3

## 2016-10-07 MED ORDER — CARVEDILOL 25 MG PO TABS
25.0000 mg | ORAL_TABLET | Freq: Two times a day (BID) | ORAL | Status: DC
  Administered 2016-10-08 – 2016-10-11 (×4): 25 mg via ORAL
  Filled 2016-10-07 (×7): qty 1

## 2016-10-07 MED ORDER — TRAMADOL HCL 50 MG PO TABS
50.0000 mg | ORAL_TABLET | Freq: Four times a day (QID) | ORAL | Status: DC | PRN
  Administered 2016-10-07 – 2016-10-11 (×6): 50 mg via ORAL
  Filled 2016-10-07 (×4): qty 1

## 2016-10-07 MED ORDER — OXYCODONE HCL 5 MG PO TABS
ORAL_TABLET | ORAL | Status: AC
  Filled 2016-10-07: qty 2

## 2016-10-07 MED ORDER — HEPARIN SODIUM (PORCINE) 1000 UNIT/ML DIALYSIS
40.0000 [IU]/kg | INTRAMUSCULAR | Status: DC | PRN
  Administered 2016-10-07: 2600 [IU] via INTRAVENOUS_CENTRAL
  Filled 2016-10-07 (×2): qty 3

## 2016-10-07 MED ORDER — TRAMADOL HCL 50 MG PO TABS
ORAL_TABLET | ORAL | Status: AC
  Filled 2016-10-07: qty 1

## 2016-10-07 MED ORDER — LORAZEPAM 0.5 MG PO TABS
ORAL_TABLET | ORAL | Status: AC
  Filled 2016-10-07: qty 2

## 2016-10-07 NOTE — Progress Notes (Signed)
[facility] Progress Note  Patient: Marcia Rogueiffany Thornton OZH:[id number]   PCP: No primary care provider on file. DOB: 05/31/1980   DOA: 09/27/2016   DOS: 10/07/2016   Date of Service: the patient was seen and examined on 10/07/2016  Subjective: Denies any Complaint. No flank pain. No nausea no vomiting. No fever no chills.  Brief hospital course: 36 year old female with past medical history of chronic kidney disease, hypertension and medical noncompliance was [facility]. Subsequently she was transferred for possible HD and hypertensive urgency. She developed uremic symptoms starting on 5/23, and an urgent right IJ hd catheter placed by vascular today.  Plan for HD as per nephrology  Assessment and Plan: Acute on chronic CKD; unknown stage previously Currently undergoing HD started on 5/25, 5/26. Nephrology following. - vascular consulted by nephrology for vein mapping and for HD catheter placement.  - urgent placement of right IJ HD catheter on 5/24. - she also underwent left radial cephalic AV fistula on 5/25.  - HD in the hospital on TTS. CLIP process initiated.  Hypertensive emergency currently on lasix, clonidine patch, amlodipine, hydralazine and labetalol po.   - hope HD will decrease the bp.   Acute hypoxic respiratory failure secondary to pulmonary edema -Currently has improved. Supplemental oxygen as needed  Anemia chronic kidney disease -Continue to monitor hemoglobin -Iron studies done on 09/28/2016 - S/p  prbc transfusion.    Medical noncompliance Discussed with the patient by multiple providers and she is agreeable to HD and blood draws..   Chest pain: substernal non radiating, occurred at rest, EKG shows normal sinus rhythm, prolonged QT, t wave abnormality and lateral ischemia.  Troponin negative, Pt refused echocardiogram. No more chest pain.  Nausea. Vomiting: secondary to uremia.  Mild abd discomfort.  US  abdomen ordered, does not show any acute pathology. Currently resolved  Back pain: appears muskulo skeletal.  Earlier provider added Added oxy and increased to 10 mg every 6 hours.  Currently ultrasound renal negative, axial spine negative. No indication to provide intensive narcotic therapy. I would reduce to tramadol when necessary. Continue scheduled Robaxin.  Elevated  I stat quantitative HCG: Minimally elevated, pt states she is not pregnant,  Repeat test is negative.  Diet: Renal diet DVT Prophylaxis: subcutaneous Heparin  Advance goals of care discussion:   Family Communication: no family was present at bedside, at the time of interview.  Disposition:  Discharge to home.  Consultants: Nephrology, vascular surgery, IR Procedures: HD  Antibiotics: Anti-infectives    Start     Dose/Rate Route Frequency Ordered Stop   10/03/16 0735  dextrose 5 % with cefUROXime (ZINACEF) ADS Med    Comments:  Ferol LuzMcmillen, Michael   : cabinet override      10/03/16 0735 10/03/16 0755       Objective: Physical Exam: Vitals:   10/07/16 1643 10/07/16 1650 10/07/16 1700 10/07/16 1730  BP: (!) 154/96 (!) 148/88 (!) 152/100 (!) 146/94  Pulse: 79 79 85 82  Resp: 18 19 13 13   Temp: 98.5 F (36.9 C)     TempSrc: Oral     SpO2: 100% 100% 100% 99%  Weight: 70.1 kg (154 lb 8.7 oz)     Height:        Intake/Output Summary (Last 24 hours) at 10/07/16 1742 Last data filed at 10/07/16 1457  Gross per 24 hour  Intake              600 ml  Output  0 ml  Net              600 ml   Filed Weights   10/06/16 0512 10/07/16 0700 10/07/16 1643  Weight: 65.8 kg (145 lb) 71.4 kg (157 lb 6.5 oz) 70.1 kg (154 lb 8.7 oz)   General: Alert, Awake and Oriented to Time, Place and Person. Appear in mild distress, affect flat Eyes: PERRL, Conjunctiva normal ENT: Oral Mucosa clear moist. Neck: difficult to assess JVD, no Abnormal Mass Or lumps Cardiovascular: S1 and S2 Present, no Murmur,  Peripheral Pulses Present Respiratory: normal respiratory effort, Bilateral Air entry equal and Decreased, no use of accessory muscle, Clear to Auscultation, no Crackles, no wheezes Abdomen: Bowel Sound present, Soft and no tenderness,  Skin: no redness, no Rash, no induration Extremities: on Pedal edema, no calf tenderness Neurologic: Grossly no focal neuro deficit. Bilaterally Equal motor strength  Data Reviewed: CBC:  Recent Labs Lab 10/02/16 1312 10/02/16 1319 10/04/16 1912  WBC  --  13.5* 13.4*  HGB 8.8* 9.4* 8.3*  HCT 26.0* 27.8* 24.5*  MCV  --  83.2 83.1  PLT  --  340 355   Basic Metabolic Panel:  Recent Labs Lab 10/02/16 1312 10/04/16 1911  NA 138 131*  K 3.8 3.6  CL 102 94*  CO2  --  24  GLUCOSE 118* 99  BUN 82* 52*  CREATININE 11.30* 7.39*  CALCIUM  --  8.7*  PHOS  --  5.7*    Liver Function Tests:  Recent Labs Lab 10/04/16 1911  ALBUMIN 3.3*   No results for input(s): LIPASE, AMYLASE in the last 168 hours. No results for input(s): AMMONIA in the last 168 hours. Coagulation Profile: No results for input(s): INR, PROTIME in the last 168 hours. Cardiac Enzymes:  Recent Labs Lab 10/01/16 1031 10/01/16 1453 10/01/16 2207  TROPONINI 0.03* 0.03* <0.03   BNP (last 3 results) No results for input(s): PROBNP in the last 8760 hours. CBG: No results for input(s): GLUCAP in the last 168 hours. Studies: No results found.  Scheduled Meds: . amLODipine  10 mg Oral Daily  . calcium acetate  667 mg Oral TID WC  . carvedilol  25 mg Oral BID WC  . cloNIDine  0.3 mg Transdermal Weekly  . darbepoetin (ARANESP) injection - DIALYSIS  150 mcg Intravenous Q Tue-HD  . furosemide  80 mg Oral Daily  . heparin  5,000 Units Subcutaneous Q8H  . hydrALAZINE  100 mg Oral Q8H  . methocarbamol  500 mg Oral TID  . multivitamin  1 tablet Oral QHS  . pantoprazole  40 mg Oral q1800  . polyethylene glycol  17 g Oral Daily  . senna-docusate  2 tablet Oral BID    Continuous Infusions: . sodium chloride    . sodium chloride    . ferric gluconate (FERRLECIT/NULECIT) IV     PRN Meds: sodium chloride, sodium chloride, acetaminophen, acetaminophen, alteplase, cloNIDine, heparin, labetalol, lidocaine (PF), lidocaine-prilocaine, LORazepam, nitroGLYCERIN, ondansetron (ZOFRAN) IV, oxyCODONE, pentafluoroprop-tetrafluoroeth, promethazine  Time spent: 30 minutes  Author: Lynden Oxford, MD [facility] Pager: [phone] 10/07/2016 5:42 PM  If 7PM-7AM, please contact night-coverage at [email or web address], password [facility]

## 2016-10-07 NOTE — Progress Notes (Signed)
HD tx initiated via HD cath w/o problem, bilat ports pull/push/flush w/o problem, will cont to monitor while on HD tx

## 2016-10-07 NOTE — Progress Notes (Signed)
Patient ID: Marcia Thornton, female   DOB: 08/06/1980, 36 y.o.   MRN: [id number]   [facility] Progress Note   Assessment/ Plan:   1. Volume overload/hypertensive emergency: Volume status improved with ultrafiltration/hemodialysis and she continues to have intermittent elevated blood pressure which may be tied into her pain/discomfort from various sources.  Prior to admission, she was on carvedilol-will switch her from labetalol to carvedilol today. 2. ESRD: New start to hemodialysis-process initiated for outpatient dialysis [place]. Next hemodialysis today (while here, TTS). 3. Anemia: Status post PRBC transfusion, currently on Aranesp. On IV Fe.  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 Await repeat labs today. 5. Nutrition: started on renal vitamin  Subjective:   Complains of an uncomfortable night-endorsing some discomfort of her neck around the [facility].    Objective:   BP (!) 156/102 (BP Location: Right Arm)   Pulse 87   Temp 99.5 F (37.5 C) (Oral)   Resp 18   Ht 5\' 4"  (1.626 m)   Wt 71.4 kg (157 lb 6.5 oz)   LMP 09/18/2016 Comment: HCG of 6.7, but pt states she is not sexually active.  SpO2 100%   BMI 27.02 kg/m   Physical Exam: WUJ:WJXBJYNGen:Appears to be comfortable-somnolent resting in bed CVS: Pulse regular rhythm, normal rate, S1 and S2 with ejection systolic murmur Resp: Clear to auscultation-no distinct rales. Right IJ TDC Abd: Soft, obese, nontender Ext: Trace ankle edema.   Labs: BMET  Recent Labs Lab 10/02/16 1312 10/04/16 1911  NA 138 131*  K 3.8 3.6  CL 102 94*  CO2  --  24  GLUCOSE 118* 99  BUN 82* 52*  CREATININE 11.30* 7.39*  CALCIUM  --  8.7*  PHOS  --  5.7*   CBC  Recent Labs Lab 10/02/16 1312 10/02/16 1319 10/04/16 1912  WBC  --  13.5* 13.4*  HGB 8.8* 9.4* 8.3*  HCT 26.0* 27.8* 24.5*  MCV  --  83.2 83.1  PLT  --  340 355     Medications:    . amLODipine  10 mg Oral Daily  . calcium acetate  667 mg Oral TID WC  . cloNIDine  0.3 mg Transdermal Weekly  . darbepoetin (ARANESP) injection - DIALYSIS  150 mcg Intravenous Q Tue-HD  . furosemide  80 mg Oral Daily  . heparin  5,000 Units Subcutaneous Q8H  . hydrALAZINE  100 mg Oral Q8H  . labetalol  200 mg Oral BID  . methocarbamol  500 mg Oral TID  . multivitamin  1 tablet Oral QHS  . pantoprazole  40 mg Oral q1800  . polyethylene glycol  17 g Oral Daily  . senna-docusate  2 tablet Oral BID     Zetta BillsJay Kebrina Friend, MD 10/07/2016, 10:21 AM

## 2016-10-07 NOTE — Progress Notes (Signed)
HD tx completed @ 2050 w/ increased bp and pain issues throughout, no interventions were effective, page to night time floor coverage MD on call went un returned before the pt left the unit, reported called to Julien NordmannSarala Mahad, RN and included the long list of meds pt received and interventions/pages made to try and alleviate pt's bp and pain issues

## 2016-10-08 MED ORDER — METHOCARBAMOL 500 MG PO TABS: 500.0000 mg | ORAL_TABLET | Freq: Three times a day (TID) | ORAL | Status: DC | PRN

## 2016-10-08 MED ORDER — LISINOPRIL 20 MG PO TABS
20.0000 mg | ORAL_TABLET | Freq: Every day | ORAL | Status: DC
  Administered 2016-10-08 – 2016-10-10 (×3): 20 mg via ORAL
  Filled 2016-10-08 (×4): qty 1

## 2016-10-08 MED ORDER — METHOCARBAMOL 500 MG PO TABS
500.0000 mg | ORAL_TABLET | Freq: Four times a day (QID) | ORAL | Status: AC
  Administered 2016-10-08 (×2): 500 mg via ORAL
  Filled 2016-10-08 (×4): qty 1

## 2016-10-08 MED ORDER — CALCIUM ACETATE (PHOS BINDER) 667 MG PO CAPS
1334.0000 mg | ORAL_CAPSULE | Freq: Three times a day (TID) | ORAL | Status: DC
  Administered 2016-10-08 – 2016-10-10 (×4): 1334 mg via ORAL
  Filled 2016-10-08 (×8): qty 2

## 2016-10-08 NOTE — Progress Notes (Signed)
PROGRESS NOTE   Marcia Thornton  ZOX:[id number]    DOB: 01/30/1981    DOA: 09/27/2016  PCP: No primary care provider on file.   I have briefly reviewed patients [facility].  Brief Narrative:  36 year old female with PMH of chronic kidney disease, HTN, medical noncompliance, resident of [place], [place] [facility] [place], [facility] and [facility] and HD initiation. She developed uremic symptoms 5/23, an urgent right IJ HD catheter was placed and HD was initiated.   Assessment & Plan:   Active Problems:   Hypertensive emergency   1. ESRD: Nephrology follow-up appreciated. New start to HD-process initiated for outpatient dialysis unit placement in [place], [place]. TTS HD while inpatient. Dialyzed late evening on 5/29 and next dialysis on 5/31. Volume management across dialysis. Right IJ HD catheter placed on 5/24 and she underwent left radial cephalic AV fistula on 5/25. 2. Hypertensive emergency: Volume management across dialysis. Blood pressures still uncontrolled in the 140-150/100 mmHg range. Nephrology adjusting medications. Starting lisinopril 20 daily with hopes to discontinue clonidine. Remains on amlodipine 10 MG daily, carvedilol 25 MG twice a day, hydralazine 100 MG 3 times a day. Also on Lasix. 3. Anemia: Secondary to chronic kidney disease. Status post PRBC transfusion. On IV Aranesp and iron per nephrology. 4. Hyponatremia: Likely related to ESRD and new HD. Stable and asymptomatic. Follow across HD. 5. Acute respiratory failure with hypoxia: Secondary to pulmonary edema from ESRD. Improved. 6. Medical noncompliance: Counseled regarding importance of compliance with all aspects of medical care. 7. Chest pain: Resolved. Troponin negative. Patient declined echocardiogram. 8. Nausea and vomiting: Likely related to uremia.  Ultrasound abdomen did not show acute pathology. Seems to have resolved. 9. Neck pain/back pain: States started after right IJ HD catheter placed.? Positional and seems musculoskeletal in nature. Trial of heating pad and scheduled Tylenol. Monitor. 10. Elevated i-STAT quantitative hCG: but repeat test negative.   DVT prophylaxis: Subcutaneous heparin Code Status: Full Family Communication: None at bedside Disposition: DC home pending nephrology clearance   Consultants:  Nephrology Vascular surgery   Procedures:  Right IJ HD catheter placement Left upper extremity AV fistula placement HD  Antimicrobials:  None    Subjective: Reports neck pain, mostly on the right side, since HD catheter placement.   ROS: No dyspnea or chest pain reported.  Objective:  Vitals:   10/08/16 0602 10/08/16 0917 10/08/16 1153 10/08/16 1443  BP: (!) 155/100 (!) 146/100 (!) 147/104 105/65  Pulse: 85 89  84  Resp: 18   20  Temp: 99.5 F (37.5 C)   99.1 F (37.3 C)  TempSrc: Oral   Oral  SpO2: 100%   100%  Weight: 68.3 kg (150 lb 9.2 oz)     Height:        Examination:  General exam: Pleasant young female lying comfortably supine in bed. Respiratory system: Clear to auscultation. Respiratory effort normal. Right IJ catheter site without acute findings. Cardiovascular system: S1 & S2 heard, RRR. No JVD, murmurs, rubs, gallops or clicks. No pedal edema. Gastrointestinal system: Abdomen is nondistended, soft and nontender. No organomegaly or masses felt. Normal bowel sounds heard. Central nervous system: Alert and oriented. No focal neurological deficits. Extremities: Symmetric 5 x 5 power. LUE AV fistula site with good thrill and no acute findings. Skin: No rashes, lesions or ulcers Psychiatry: Judgement and insight appear normal. Mood & affect appropriate.  Musculoskeletal  system: Painful range of motion movements of neck. No other acute findings.    Data Reviewed: I have personally  reviewed following labs and imaging studies  CBC:  Recent Labs Lab 10/02/16 1312 10/02/16 1319 10/04/16 1912 10/07/16 1645  WBC  --  13.5* 13.4* 10.2  HGB 8.8* 9.4* 8.3* 8.0*  HCT 26.0* 27.8* 24.5* 23.3*  MCV  --  83.2 83.1 81.5  PLT  --  340 355 336   Basic Metabolic Panel:  Recent Labs Lab 10/02/16 1312 10/04/16 1911 10/07/16 1645  NA 138 131* 129*  K 3.8 3.6 4.0  CL 102 94* 92*  CO2  --  24 23  GLUCOSE 118* 99 105*  BUN 82* 52* 69*  CREATININE 11.30* 7.39* 8.58*  CALCIUM  --  8.7* 8.5*  PHOS  --  5.7* 7.8*   Liver Function Tests:  Recent Labs Lab 10/04/16 1911 10/07/16 1645  ALBUMIN 3.3* 3.3*   Cardiac Enzymes:  Recent Labs Lab 10/01/16 2207  TROPONINI <0.03        Radiology Studies: No results found.      Scheduled Meds: . amLODipine  10 mg Oral Daily  . calcium acetate  1,334 mg Oral TID WC  . carvedilol  25 mg Oral BID WC  . cloNIDine  0.3 mg Transdermal Weekly  . darbepoetin (ARANESP) injection - DIALYSIS  150 mcg Intravenous Q Tue-HD  . furosemide  80 mg Oral Daily  . heparin  5,000 Units Subcutaneous Q8H  . hydrALAZINE  100 mg Oral Q8H  . lisinopril  20 mg Oral Daily  . methocarbamol  500 mg Oral QID  . multivitamin  1 tablet Oral QHS  . pantoprazole  40 mg Oral q1800  . polyethylene glycol  17 g Oral Daily  . senna-docusate  2 tablet Oral BID   Continuous Infusions: . sodium chloride    . sodium chloride    . ferric gluconate (FERRLECIT/NULECIT) IV 125 mg (10/07/16 1802)     LOS: 11 days     Isaiah Cianci, MD, FACP, FHM. [facility] Pager [phone]  If 7PM-7AM, please contact night-coverage [email or web address] Password [facility] 10/08/2016, 4:42 PM

## 2016-10-08 NOTE — Progress Notes (Signed)
Patient ID: Marcia Thornton, female   DOB: 03/23/1981, 36 y.o.   MRN: [id number]  [place] [facility] Progress Note   Assessment/ Plan:   1. Volume overload/hypertensive emergency: Improved with hemodialysis--remains with elevated blood pressures- start lisinopril 20mg  daily (with hopes to DC clonidine). 2. ESRD: New start to hemodialysis-process initiated [place]. Next hemodialysis tomorrow (while here, TTS). 3. Anemia: Status post PRBC transfusion, currently on Aranesp. On IV Fe.  4. CKD-MBD: Hyperphosphatemic on last labs-increase calcium acetate dose 3 times a day before meals. Await repeat labs tomorrow. 5. Nutrition: started on renal vitamin  Subjective:   Complains of an uncomfortable night-endorsing some discomfort of her neck following delay in getting HD yesterday.    Objective:   BP (!) 146/100   Pulse 89   Temp 99.5 F (37.5 C) (Oral)   Resp 18   Ht 5\' 4"  (1.626 m)   Wt 68.3 kg (150 lb 9.2 oz)   LMP 09/18/2016 Comment: HCG of 6.7, but pt states she is not sexually active.  SpO2 100%   BMI 25.85 kg/m   Physical Exam: WUJ:[id number]:Appears to be uncomfortable resting in bed CVS: Pulse regular rhythm, normal rate, S1 and S2 normal Resp: Clear to auscultation-no distinct rales. Right IJ TDC Abd: Soft, obese, nontender Ext: Trace ankle edema.   Labs: BMET  Recent Labs Lab 10/02/16 1312 10/04/16 1911 10/07/16 1645  NA 138 131* 129*  K 3.8 3.6 4.0  CL 102 94* 92*  CO2  --  24 23  GLUCOSE 118* 99 105*  BUN 82* 52* 69*  CREATININE 11.30* 7.39* 8.58*  CALCIUM  --  8.7* 8.5*  PHOS  --  5.7* 7.8*   CBC  Recent Labs Lab 10/02/16 1312 10/02/16 1319 10/04/16 1912 10/07/16 1645  WBC  --  13.5* 13.4* 10.2  HGB 8.8* 9.4* 8.3* 8.0*  HCT 26.0* 27.8* 24.5* 23.3*  MCV  --  83.2 83.1 81.5  PLT  --  340 355 336    Medications:    . amLODipine  10 mg Oral Daily  . calcium acetate  667 mg Oral TID WC  . carvedilol   25 mg Oral BID WC  . cloNIDine  0.3 mg Transdermal Weekly  . darbepoetin (ARANESP) injection - DIALYSIS  150 mcg Intravenous Q Tue-HD  . furosemide  80 mg Oral Daily  . heparin  5,000 Units Subcutaneous Q8H  . hydrALAZINE  100 mg Oral Q8H  . methocarbamol  500 mg Oral TID  . multivitamin  1 tablet Oral QHS  . pantoprazole  40 mg Oral q1800  . polyethylene glycol  17 g Oral Daily  . senna-docusate  2 tablet Oral BID   Zetta BillsJay Vincie Linn, MD 10/08/2016, 11:39 AM

## 2016-10-09 DIAGNOSIS — M542 Cervicalgia: Secondary | ICD-10-CM

## 2016-10-09 LAB — RENAL FUNCTION PANEL
ANION GAP: 14 (ref 5–15)
Albumin: 3.5 g/dL (ref 3.5–5.0)
BUN: 38 mg/dL — ABNORMAL HIGH (ref 6–20)
CHLORIDE: 94 mmol/L — AB (ref 101–111)
CO2: 23 mmol/L (ref 22–32)
CREATININE: 7.14 mg/dL — AB (ref 0.44–1.00)
Calcium: 9.2 mg/dL (ref 8.9–10.3)
GFR, EST AFRICAN AMERICAN: 8 mL/min — AB (ref >60)
GFR, EST NON AFRICAN AMERICAN: 7 mL/min — AB (ref >60)
Glucose, Bld: 92 mg/dL (ref 65–99)
Phosphorus: 7.1 mg/dL — ABNORMAL HIGH (ref 2.5–4.6)
Potassium: 4 mmol/L (ref 3.5–5.1)
Sodium: 131 mmol/L — ABNORMAL LOW (ref 135–145)

## 2016-10-09 LAB — CBC
HEMATOCRIT: 28 % — AB (ref 36.0–46.0)
HEMOGLOBIN: 9.5 g/dL — AB (ref 12.0–15.0)
MCH: 28 pg (ref 26.0–34.0)
MCHC: 33.9 g/dL (ref 30.0–36.0)
MCV: 82.6 fL (ref 78.0–100.0)
PLATELETS: 355 10*3/uL (ref 150–400)
RBC: 3.39 MIL/uL — AB (ref 3.87–5.11)
RDW: 13 % (ref 11.5–15.5)
WBC: 10.6 10*3/uL — AB (ref 4.0–10.5)

## 2016-10-09 MED ORDER — ONDANSETRON HCL 4 MG/2ML IJ SOLN
INTRAMUSCULAR | Status: AC
  Filled 2016-10-09: qty 2

## 2016-10-09 MED ORDER — TRAMADOL HCL 50 MG PO TABS
ORAL_TABLET | ORAL | Status: AC
Start: 2016-10-09 — End: 2016-10-09
  Filled 2016-10-09: qty 1

## 2016-10-09 MED ORDER — HEPARIN SODIUM (PORCINE) 1000 UNIT/ML DIALYSIS: 40.0000 [IU]/kg | INTRAMUSCULAR | Status: DC | PRN

## 2016-10-09 NOTE — Progress Notes (Signed)
PROGRESS NOTE   Marcia Thornton  ZOX:[id number]    DOB: 11/08/1980    DOA: 09/27/2016  PCP: No primary care provider on file.   I have briefly reviewed patients previous medical records in [facility].  Brief Narrative:  36 year old female with PMH of chronic kidney disease, HTN, medical noncompliance, resident of [place], follows with Nephrology in [facility], admitted initially to [facility] for hypertensive emergency and subsequently transferred to [facility] for nephrology consultation and HD initiation. She developed uremic symptoms 5/23, an urgent right IJ HD catheter was placed and HD was initiated.Patient anxious for discharge. Nephrology coordinating/trying to arrange outpatient HD.   Assessment & Plan:   Active Problems:   Hypertensive emergency   1. ESRD: Nephrology follow-up appreciated. New start to HD-process initiated for outpatient dialysis unit placement in [place], [place]. TTS HD while inpatient. Dialyzed 5/29 and 5/31. Volume management across dialysis, volume status improved. Right IJ TDC placed on 5/24 and she underwent left radial cephalic AV fistula on 5/25. I discussed with Dr. Allena Katz and their services trying to arrange outpatient HD in her hometown, [place]. Unfortunately patient's primary N.ephrologist is an [place] and does not provide services in [place] 2. Hypertensive emergency: Volume management across dialysis. Blood pressures are better today. Nephrology adjusting medications. Starting lisinopril 20 daily with hopes to discontinue clonidine. Remains on amlodipine 10 MG daily, carvedilol 25 MG twice a day, hydralazine 100 MG 3 times a day. Also on Lasix. Issues with compliance/patient refusing medications intermittently. Counseled regarding compliance. 3. Anemia: Secondary to chronic kidney disease. Status post PRBC transfusion. On IV Aranesp and iron per nephrology. Hemoglobin improved. 4. Hyponatremia: Likely related to ESRD  and new HD. Stable and asymptomatic. Follow across HD. 5. Acute respiratory failure with hypoxia: Secondary to pulmonary edema from ESRD. Resolved. 6. Medical noncompliance: Counseled regarding importance of compliance with all aspects of medical care. 7. Chest pain: Resolved. Troponin negative. Patient declined echocardiogram. 8. Nausea and vomiting: Likely related to uremia. Ultrasound abdomen did not show acute pathology. Resolved. 9. Neck pain/back pain: States started after right IJ HD catheter placed.? Positional and seems musculoskeletal in nature. Trial of heating pad and scheduled Tylenol. Monitor. Improving. Nephrologist also evaluated her and feels that this is musculoskeletal. 10. Elevated i-STAT quantitative hCG: but repeat test negative.   DVT prophylaxis: Subcutaneous heparin Code Status: Full Family Communication: Discussed with significant other at bedside. Disposition: DC home pending nephrology clearance   Consultants:  Nephrology Vascular surgery   Procedures:  Right IJ HD catheter placement Left upper extremity AV fistula placement HD  Antimicrobials:  None    Subjective: States that her neck pain is better. Frustrated and wants to go home as soon as possible. As per RN, has been refusing medications. Patient stated that she was going to try to call her primary nephrologist in [facility].  ROS: No dyspnea or chest pain reported.  Objective:  Vitals:   10/09/16 1323 10/09/16 1328 10/09/16 1400 10/09/16 1430  BP: 126/76 120/76 114/68 110/64  Pulse: 76 76 80 82  Resp: 18     Temp: 98 F (36.7 C)     TempSrc: Oral     SpO2: 100%     Weight: 67.6 kg (149 lb 0.5 oz)     Height:        Examination:  General exam: Pleasant young female lying comfortably supine in bed. Respiratory system: Clear to auscultation. Respiratory effort normal. Right IJ TDC site without acute findings. Cardiovascular system:  S1 & S2 heard, RRR. No JVD, murmurs, rubs,  gallops or clicks. No pedal edema. Not on telemetry. Gastrointestinal system: Abdomen is nondistended, soft and nontender. No organomegaly or masses felt. Normal bowel sounds heard. Central nervous system: Alert and oriented. No focal neurological deficits. Extremities: Symmetric 5 x 5 power. LUE AV fistula site with good thrill and no acute findings. Skin: No rashes, lesions or ulcers Psychiatry: Judgement and insight appear normal. Mood & affect appropriate.  Musculoskeletal system: Painful range of motion movements of neck-better. No other acute findings.    Data Reviewed: I have personally reviewed following labs and imaging studies  CBC:  Recent Labs Lab 10/04/16 1912 10/07/16 1645 10/09/16 0713  WBC 13.4* 10.2 10.6*  HGB 8.3* 8.0* 9.5*  HCT 24.5* 23.3* 28.0*  MCV 83.1 81.5 82.6  PLT 355 336 355   Basic Metabolic Panel:  Recent Labs Lab 10/04/16 1911 10/07/16 1645 10/09/16 0713  NA 131* 129* 131*  K 3.6 4.0 4.0  CL 94* 92* 94*  CO2 24 23 23   GLUCOSE 99 105* 92  BUN 52* 69* 38*  CREATININE 7.39* 8.58* 7.14*  CALCIUM 8.7* 8.5* 9.2  PHOS 5.7* 7.8* 7.1*   Liver Function Tests:  Recent Labs Lab 10/04/16 1911 10/07/16 1645 10/09/16 0713  ALBUMIN 3.3* 3.3* 3.5   Cardiac Enzymes: No results for input(s): CKTOTAL, CKMB, CKMBINDEX, TROPONINI in the last 168 hours.      Radiology Studies: No results found.      Scheduled Meds: . traMADol      . amLODipine  10 mg Oral Daily  . calcium acetate  1,334 mg Oral TID WC  . carvedilol  25 mg Oral BID WC  . cloNIDine  0.3 mg Transdermal Weekly  . darbepoetin (ARANESP) injection - DIALYSIS  150 mcg Intravenous Q Tue-HD  . furosemide  80 mg Oral Daily  . heparin  5,000 Units Subcutaneous Q8H  . hydrALAZINE  100 mg Oral Q8H  . lisinopril  20 mg Oral Daily  . multivitamin  1 tablet Oral QHS  . pantoprazole  40 mg Oral q1800  . polyethylene glycol  17 g Oral Daily  . senna-docusate  2 tablet Oral BID    Continuous Infusions: . sodium chloride    . sodium chloride    . ferric gluconate (FERRLECIT/NULECIT) IV 125 mg (10/07/16 1802)     LOS: 12 days     HONGALGI,ANAND, MD, FACP, FHM. [facility] Pager [phone]  If 7PM-7AM, please contact night-coverage [email or web address] Password [email or web address] 10/09/2016, 3:59 PM

## 2016-10-09 NOTE — Progress Notes (Signed)
Patient ID: Marcia Thornton, female   DOB: 11/15/1980, 36 y.o.   MRN: [id number]  [place] [facility] Progress Note   Assessment/ Plan:   1. Volume overload/hypertensive emergency: Volume status better with ultrafiltration and hemodialysis however, she continues to struggle with elevated blood pressure in part because of her refusal to take medications at their scheduled time. Encouraged compliance. 2. ESRD: Recently started on dialysis, continue hemodialysis on a Tuesday/Thursday/Saturday schedule as we await outpatient dialysis unit placement/acceptance. She is currently getting [facility] and has a left RCF that was placed on 10/03/16. 3. Anemia: Status post PRBC transfusion, currently on Aranesp. Continue intravenous iron. 4. CKD-MBD: Hyperphosphatemic on last labs-increase calcium acetate dose 3 times a day before meals. Await repeat labs tomorrow. 5. Nutrition: started on renal vitamin  Subjective:   She continues to complain of intermittent neck pain/stiffness and is frustrated by the length of stay while awaiting an outpatient dialysis unit.    Objective:   BP (!) 154/105   Pulse 82   Temp 99.2 F (37.3 C) (Oral)   Resp 18   Ht 5\' 4"  (1.626 m)   Wt 69.4 kg (153 lb 1.6 oz)   LMP 09/18/2016 Comment: HCG of 6.7, but pt states she is not sexually active.  SpO2 100%   BMI 26.28 kg/m   Physical Exam: NWG:[id number]:Appears to be uncomfortable resting in bed CVS: Pulse regular rhythm, normal rate, S1 and S2 normal Resp: Clear to auscultation-no distinct rales. Right IJ TDC Abd: Soft, obese, nontender Ext: No LE edema. Left RCF positive thrill.  Labs: BMET  Recent Labs Lab 10/02/16 1312 10/04/16 1911 10/07/16 1645 10/09/16 0713  NA 138 131* 129* 131*  K 3.8 3.6 4.0 4.0  CL 102 94* 92* 94*  CO2  --  24 23 23   GLUCOSE 118* 99 105* 92  BUN 82* 52* 69* 38*  CREATININE 11.30* 7.39* 8.58* 7.14*  CALCIUM  --  8.7* 8.5* 9.2  PHOS  --  5.7* 7.8* 7.1*    CBC  Recent Labs Lab 10/02/16 1319 10/04/16 1912 10/07/16 1645 10/09/16 0713  WBC 13.5* 13.4* 10.2 10.6*  HGB 9.4* 8.3* 8.0* 9.5*  HCT 27.8* 24.5* 23.3* 28.0*  MCV 83.2 83.1 81.5 82.6  PLT 340 355 336 355    Medications:    . amLODipine  10 mg Oral Daily  . calcium acetate  1,334 mg Oral TID WC  . carvedilol  25 mg Oral BID WC  . cloNIDine  0.3 mg Transdermal Weekly  . darbepoetin (ARANESP) injection - DIALYSIS  150 mcg Intravenous Q Tue-HD  . furosemide  80 mg Oral Daily  . heparin  5,000 Units Subcutaneous Q8H  . hydrALAZINE  100 mg Oral Q8H  . lisinopril  20 mg Oral Daily  . methocarbamol  500 mg Oral QID  . multivitamin  1 tablet Oral QHS  . pantoprazole  40 mg Oral q1800  . polyethylene glycol  17 g Oral Daily  . senna-docusate  2 tablet Oral BID   Zetta BillsJay Lakeeta Dobosz, MD 10/09/2016, 10:35 AM

## 2016-10-10 MED ORDER — FUROSEMIDE 80 MG PO TABS: 80.0000 mg | ORAL_TABLET | Freq: Every day | ORAL | 0 refills | Status: DC

## 2016-10-10 MED ORDER — PANTOPRAZOLE SODIUM 40 MG PO TBEC: 40.0000 mg | DELAYED_RELEASE_TABLET | Freq: Every day | ORAL | 0 refills | Status: DC

## 2016-10-10 MED ORDER — CALCIUM ACETATE (PHOS BINDER) 667 MG PO CAPS: 1334.0000 mg | ORAL_CAPSULE | Freq: Three times a day (TID) | ORAL | 0 refills | Status: DC

## 2016-10-10 MED ORDER — RENA-VITE PO TABS
1.0000 | ORAL_TABLET | Freq: Every day | ORAL | 0 refills | Status: DC
Start: 2016-10-10 — End: 2022-07-30

## 2016-10-10 MED ORDER — PROMETHAZINE HCL 12.5 MG PO TABS: 12.5000 mg | ORAL_TABLET | Freq: Four times a day (QID) | ORAL | 0 refills | Status: DC | PRN

## 2016-10-10 MED ORDER — ACETAMINOPHEN 325 MG PO TABS: 650.0000 mg | ORAL_TABLET | Freq: Four times a day (QID) | ORAL | Status: DC | PRN

## 2016-10-10 MED ORDER — HYDRALAZINE HCL 100 MG PO TABS: 100.0000 mg | ORAL_TABLET | Freq: Three times a day (TID) | ORAL | 0 refills | Status: AC

## 2016-10-10 MED ORDER — PROMETHAZINE HCL 25 MG PO TABS
12.5000 mg | ORAL_TABLET | Freq: Four times a day (QID) | ORAL | Status: DC | PRN
  Administered 2016-10-10 – 2016-10-11 (×2): 12.5 mg via ORAL
  Filled 2016-10-10 (×2): qty 1

## 2016-10-10 MED ORDER — LISINOPRIL 20 MG PO TABS: 20.0000 mg | ORAL_TABLET | Freq: Every day | ORAL | 0 refills | Status: DC

## 2016-10-10 NOTE — Progress Notes (Signed)
CSW spoke with patient regarding resources. Patient wanted to know if the [employer] process had been completed yet. CSW referred her to [facility] helping to coordinate the process. CSW provided resources regarding assistance for patients with dialysis. CSW offered [facility] Medicaid application as well, but patient reported that she already applied.   CSW signing off.  Osborne Cascoadia Tennille Montelongo LCSWA [phone]

## 2016-10-10 NOTE — Addendum Note (Signed)
Addendum  created 10/10/16 1234 by Fong Mccarry D, MD   Sign clinical note    

## 2016-10-10 NOTE — Progress Notes (Signed)
Awaiting acceptance to OP HD unit at this time. Unfortunately, this is now entirely out of our hands as she is going to a non-[facility] unit. I have asked for assistance from the case manager assigned to her. [place] (I previously discussed with [place] nephrolog[place] Will order for HD tomorrow.  Zetta BillsJay Fiorela Pelzer MD [facility]. Office # [phone] Pager # [phone] 11:36 AM

## 2016-10-10 NOTE — Progress Notes (Signed)
[facility] [place] . 1st treatment Monday October 13 2016 at 4:45 pm .Tentative dialysis schedule Monday Wednesday Friday at 5:10pm  .

## 2016-10-10 NOTE — Discharge Summary (Signed)
Physician Discharge Summary  Marcia Thornton WUJ:[id number] DOB: August 11, 1980  PCP: No primary care provider on file.  Admit date: 09/27/2016 Discharge date: 10/10/2016  Recommendations for Outpatient Follow-up:  1. [name] [place]: This is patient's outpatient hemodialysis center. Patient has been given paperwork with the exact location/address of the dialysis unit by the nephrology service here. First treatment appointment is on Monday, 10/13/2016 at 4:45 PM. Tentative dialysis schedule thereafter is Mondays, Wednesdays, Fridays at 5:10 PM. 2. Dr. Fabienne Bruns, Vascular Surgery in 6 weeks.  Home Health: None Equipment/Devices: None    Discharge Condition: Improved and stable  CODE STATUS: Full  Diet recommendation: Heart healthy diet.  Discharge Diagnoses:  Active Problems:   Hypertensive emergency   Brief Summary: 36 year old female with PMH of chronic kidney disease, HTN, medical noncompliance, resident of [place], follows with Nephrology (Dr. Prentiss Bells) in [facility], admitted initially to [facility] for hypertensive emergency and subsequently transferred to [facility] for Nephrology consultation and HD initiation. She developed uremic symptoms 5/23, an urgent right IJ HD catheter was placed and HD was initiated.  Assessment & Plan:   1. ESRD: Nephrology follow-up appreciated. New start to HD. TTS HD while inpatient. Dialyzed 5/29 and 5/31. Volume management across dialysis, volume status improved. Right IJ TDC placed on 5/24 and she underwent left radial cephalic AV fistula on 5/25. Unfortunately patient's primary N.ephrologist is an [place] and does not provide services in [place]. She had been visiting [place] and plans to return to her home in [place]. Discussed with nephrologist who updated me that they have arranged for outpatient HD close to her home and that the patient has been given paperwork with the exact location/address of  the dialysis unit in [place] and he has cleared her for discharge home. Next dialysis will be on Monday 10/13/16. 2. Hypertensive emergency: Volume management across dialysis. Nephrology assisted with adjusting medications. Suspect a strong component of medical noncompliance both outpatient and while hospitalized. Discussed in detail with Nephrology who recommended continuing polypharmacy antihypertensives at discharge which can be slowly tapered and decreased as outpatient depending on her blood pressures across dialysis. Clonidine patch will be changed back to her home dose of clonidine. Her antihypertensives at discharge are listed below. Counseled regarding compliance. 3. Anemia: Secondary to chronic kidney disease. Status post PRBC transfusion. Treated with IV Aranesp and iron per nephrology. Hemoglobin improved. Follow CBCs periodically across dialysis. 4. Hyponatremia: Likely related to ESRD and new HD. Stable and asymptomatic. Follow across HD. 5. Acute respiratory failure with hypoxia: Secondary to pulmonary edema from ESRD. Resolved. 6. Medical noncompliance: Counseled regarding importance of compliance with all aspects of medical care. 7. Chest pain: Resolved. Troponin negative. Patient declined echocardiogram which can be pursued with her PCP or nephrologist in [place]. 8. Nausea and vomiting: Ultrasound abdomen did not show acute pathology. Patient has intermittent nausea, mostly dry heaving and some vomiting, mostly around dialysis. No abdominal pain reported. Last BM 2-3 days ago. Continue PPI and when necessary antiemetics. 9. Neck pain/back pain: States started after right IJ HD catheter placed.? Positional and seems musculoskeletal in nature. Nephrologist also evaluated her and feels that this is musculoskeletal. Much improved and almost resolved.    Consultants:  Nephrology Vascular surgery   Procedures:  Right IJ HD catheter placement Left upper extremity AV fistula  placement HD   Discharge Instructions  Discharge Instructions    (HEART FAILURE PATIENTS) Call MD:  Anytime you have any of the following symptoms: 1) 3  pound weight gain in 24 hours or 5 pounds in 1 week 2) shortness of breath, with or without a dry hacking cough 3) swelling in the hands, feet or stomach 4) if you have to sleep on extra pillows at night in order to breathe.    Complete by:  As directed    Call MD for:  difficulty breathing, headache or visual disturbances    Complete by:  As directed    Call MD for:  extreme fatigue    Complete by:  As directed    Call MD for:  persistant dizziness or light-headedness    Complete by:  As directed    Call MD for:  persistant nausea and vomiting    Complete by:  As directed    Call MD for:  redness, tenderness, or signs of infection (pain, swelling, redness, odor or green/yellow discharge around incision site)    Complete by:  As directed    Call MD for:  severe uncontrolled pain    Complete by:  As directed    Call MD for:  temperature >100.4    Complete by:  As directed    Diet - low sodium heart healthy    Complete by:  As directed    Increase activity slowly    Complete by:  As directed        Medication List    STOP taking these medications   aspirin-acetaminophen-caffeine 250-250-65 MG tablet Commonly known as:  EXCEDRIN MIGRAINE     TAKE these medications   acetaminophen 325 MG tablet Commonly known as:  TYLENOL Take 2 tablets (650 mg total) by mouth every 6 (six) hours as needed for mild pain, moderate pain, fever or headache.   amLODipine 10 MG tablet Commonly known as:  NORVASC Take 10 mg by mouth daily.   calcitRIOL 0.25 MCG capsule Commonly known as:  ROCALTROL Take 0.25 mcg by mouth daily.   calcium acetate 667 MG capsule Commonly known as:  PHOSLO Take 2 capsules (1,334 mg total) by mouth 3 (three) times daily with meals.   carvedilol 25 MG tablet Commonly known as:  COREG Take 25 mg by mouth 2  (two) times daily with a meal.   cloNIDine 0.2 MG tablet Commonly known as:  CATAPRES Take 0.2 mg by mouth 2 (two) times daily.   furosemide 80 MG tablet Commonly known as:  LASIX Take 1 tablet (80 mg total) by mouth daily. Start taking on:  10/11/2016   hydrALAZINE 100 MG tablet Commonly known as:  APRESOLINE Take 1 tablet (100 mg total) by mouth 3 (three) times daily.   lisinopril 20 MG tablet Commonly known as:  PRINIVIL,ZESTRIL Take 1 tablet (20 mg total) by mouth daily. Start taking on:  10/11/2016   multivitamin Tabs tablet Take 1 tablet by mouth at bedtime.   pantoprazole 40 MG tablet Commonly known as:  PROTONIX Take 1 tablet (40 mg total) by mouth daily at 6 PM.   promethazine 12.5 MG tablet Commonly known as:  PHENERGAN Take 1 tablet (12.5 mg total) by mouth every 6 (six) hours as needed for nausea or vomiting.      Follow-up Information    Sherren Kerns, MD Follow up in 6 week(s).   Specialties:  Vascular Surgery, Cardiology Why:  Office will call you to arrange your appt (sent) Contact information: [address] [place] [address] [phone]        [facility] [place] Follow up on 10/13/2016.  Why:  Go to this dialysis Center for ongoing treatments. 1st treatment Monday October 13 2016 at 4:45 pm .Tentative dialysis schedule Monday Wednesday Friday at 5:10pm.         No Known Allergies    Procedures/Studies: Dg Lumbar Spine Complete  Result Date: 10/06/2016 CLINICAL DATA:  RIGHT flank pain and discomfort, kidney disease, recently started dialysis EXAM: LUMBAR SPINE - COMPLETE 4+ VIEW COMPARISON:  None FINDINGS: Five non-rib-bearing lumbar vertebra. Osseous mineralization normal. Vertebral body and disc space heights maintained. Short pedicles of L5. No acute fracture, subluxation, or bone destruction. No spondylolysis. SI joints symmetric. IMPRESSION: No acute osseous abnormalities. Electronically Signed   By: Ulyses SouthwardMark  Boles  M.D.   On: 10/06/2016 13:07   [place] Abdomen Complete  Result Date: 10/01/2016 CLINICAL DATA:  Chronic kidney disease, nausea and vomiting for 10 days EXAM: ABDOMEN ULTRASOUND COMPLETE COMPARISON:  None. FINDINGS: Gallbladder: No gallstones or wall thickening visualized. No sonographic Murphy sign noted by sonographer. Common bile duct: Diameter: 4.2 mm Liver: No focal lesion identified. Within normal limits in parenchymal echogenicity. IVC: No abnormality visualized. Pancreas: Visualized portion unremarkable. Spleen: Size and appearance within normal limits. Right Kidney: Length: 8.9 cm. Increased cortical echogenicity. No hydronephrosis. Left Kidney: Length: 10.1 cm. Increased cortical echogenicity. No hydronephrosis. Abdominal aorta: No aneurysm visualized. Other findings: Incidental note made of small left pleural effusion IMPRESSION: 1. Negative for gallstones or biliary dilatation 2. Echogenic kidneys consistent with medical renal disease. No hydronephrosis 3. Incidental trace left pleural effusion Electronically Signed   By: Jasmine PangKim  Fujinaga M.D.   On: 10/01/2016 22:25   [place] Renal  Result Date: 10/06/2016 CLINICAL DATA:  Right flank pain for 1 week. History of hypertension chronic kidney disease. EXAM: RENAL / URINARY TRACT ULTRASOUND COMPLETE COMPARISON:  Abdominal ultrasound 10/01/2016. FINDINGS: Right Kidney: Length: 10.4 cm. There is diffusely increased cortical echogenicity as before. No focal lesion or hydronephrosis. Left Kidney: Length: 9.8 cm. There is diffusely increased cortical echogenicity as before. No focal lesion or hydronephrosis. Bladder: Mild nonspecific bladder wall thickening. Bilateral ureteral jets are noted. IMPRESSION: 1. No acute findings or hydronephrosis. 2. Echogenic kidneys consistent with chronic medical renal disease. 3. Nonspecific bladder wall thickening.  Correlate clinically. Electronically Signed   By: Carey BullocksWilliam  Veazey M.D.   On: 10/06/2016 13:37   Dg Chest Port 1  View  Result Date: 10/02/2016 CLINICAL DATA:  Dialysis catheter insertion EXAM: PORTABLE CHEST 1 VIEW COMPARISON:  09/27/2016 FINDINGS: Stable cardiomegaly. Low volumes with atelectasis greater on the left. Permacatheter on the right with tip at the upper right atrium. No pneumothorax. IMPRESSION: 1. No acute finding after permacatheter placement. Tip is at the upper cavoatrial junction. 2. Chronic cardiomegaly. 3. Low volume chest with atelectasis. Electronically Signed   By: Marnee SpringJonathon  Watts M.D.   On: 10/02/2016 15:19   Dg Chest Port 1 View  Result Date: 09/27/2016 CLINICAL DATA:  Hypoxia EXAM: PORTABLE CHEST 1 VIEW COMPARISON:  None. FINDINGS: Cardiac shadow is mildly enlarged. The lungs are clear bilaterally. No focal infiltrate is seen. No bony abnormality is noted. IMPRESSION: No acute abnormality noted. Electronically Signed   By: Alcide CleverMark  Lukens M.D.   On: 09/27/2016 20:31   Dg Fluoro Guide Cv Line-no Report  Result Date: 10/02/2016 Fluoroscopy was utilized by the requesting physician.  No radiographic interpretation.      Subjective: Seen this morning. Occasional episodes of nausea, dry heaves and minimal nonbloody emesis. States that mostly around dialysis. No abdominal pain reported. Had BM 2-3 days ago.  Does not complain of neck pain. Anxious to be discharged so she can return home in [place].  Discharge Exam:  Vitals:   10/09/16 2215 10/10/16 0540 10/10/16 1012 10/10/16 1335  BP:  (!) 154/103 (!) 129/98 (!) 147/111  Pulse:  97 92 90  Resp:  18  16  Temp: 97.8 F (36.6 C) 99 F (37.2 C)  99.2 F (37.3 C)  TempSrc: Oral Oral    SpO2:  93%  96%  Weight:  75 kg (165 lb 5.5 oz)    Height:        General exam: Pleasant young female lying comfortably supine in bed. Respiratory system: Clear to auscultation. Respiratory effort normal. Right IJ TDC site without acute findings. Cardiovascular system: S1 & S2 heard, RRR. No JVD, murmurs, rubs, gallops or clicks. No pedal edema.  Not on telemetry. Gastrointestinal system: Abdomen is nondistended, soft and nontender. No organomegaly or masses felt. Normal bowel sounds heard. Central nervous system: Alert and oriented. No focal neurological deficits. Extremities: Symmetric 5 x 5 power. LUE AV fistula site with good thrill and no acute findings. Skin: No rashes, lesions or ulcers Psychiatry: Judgement and insight appear normal. Mood & affect appropriate.  Musculoskeletal system: Painful range of motion movements of neck-better. No other acute findings.    The results of significant diagnostics from this hospitalization (including imaging, microbiology, ancillary and laboratory) are listed below for reference.     Microbiology: No results found for this or any previous visit (from the past 240 hour(s)).   Labs: CBC:  Recent Labs Lab 10/04/16 1912 10/07/16 1645 10/09/16 0713  WBC 13.4* 10.2 10.6*  HGB 8.3* 8.0* 9.5*  HCT 24.5* 23.3* 28.0*  MCV 83.1 81.5 82.6  PLT 355 336 355   Basic Metabolic Panel:  Recent Labs Lab 10/04/16 1911 10/07/16 1645 10/09/16 0713  NA 131* 129* 131*  K 3.6 4.0 4.0  CL 94* 92* 94*  CO2 24 23 23   GLUCOSE 99 105* 92  BUN 52* 69* 38*  CREATININE 7.39* 8.58* 7.14*  CALCIUM 8.7* 8.5* 9.2  PHOS 5.7* 7.8* 7.1*   Liver Function Tests:  Recent Labs Lab 10/04/16 1911 10/07/16 1645 10/09/16 0713  ALBUMIN 3.3* 3.3* 3.5   Discussed with patient's significant other at bedside. Updated care and answered questions. Patient and her significant other were advised to have her physicians in [place] get all her medical records from this hospitalization faxed over. They verbalized understanding.   Time coordinating discharge: Over 30 minutes  SIGNED:  Marcellus Scott, MD, FACP, FHM. [facility] Pager [phone]  If 7PM-7AM, please contact night-coverage [email or web address] Password [email or web address] 10/10/2016, 4:51 PM

## 2016-10-10 NOTE — Progress Notes (Signed)
CM spoke with Admission Coordinator/Felecia [facility]) @ [phone], ext. [phone] and learned pt has been CLIPP, chair time is 10/13/2016 @ 5:10 pm, [employer].  CM made Dr. Patel/nephrologist aware. Gae GallopAngela Wynelle Dreier RN,BSN,CM

## 2016-10-10 NOTE — Care Management Note (Signed)
Case Management Note  Patient Details  Name: Marcia Thornton MRN: [id number] Date of Birth: 04/14/1981  Subjective/Objective:           Admitted with Hypertensive emergency.         Action/Plan: Plan is [place] ([place]) today with HD services in place.Match Letter given and explained per CM  to assist with medication needs, pt without health insurance and states can't afford medication cost.  Expected Discharge Date:  10/10/16               Expected Discharge Plan:  Home/Self Care  In-House Referral:     Discharge planning Services  CM Consult, MATCH Program   Status of Service:  Completed, signed off  If discussed at Long Length of Stay Meetings, dates discussed:    Additional Comments:  Epifanio LeschesCole, Damaso Laday Hudson, RN 10/10/2016, 5:07 PM

## 2016-10-11 ENCOUNTER — Inpatient Hospital Stay (HOSPITAL_COMMUNITY): Payer: Medicaid - Out of State

## 2016-10-11 MED ORDER — PROMETHAZINE HCL 25 MG PO TABS
25.0000 mg | ORAL_TABLET | Freq: Four times a day (QID) | ORAL | Status: DC | PRN
  Administered 2016-10-11: 25 mg via ORAL
  Filled 2016-10-11: qty 1

## 2016-10-11 MED ORDER — PANTOPRAZOLE SODIUM 40 MG IV SOLR
40.0000 mg | INTRAVENOUS | Status: DC
  Administered 2016-10-11: 40 mg via INTRAVENOUS
  Filled 2016-10-11 (×2): qty 40

## 2016-10-11 MED ORDER — LISINOPRIL 40 MG PO TABS
40.0000 mg | ORAL_TABLET | Freq: Every day | ORAL | Status: DC
  Filled 2016-10-11: qty 1

## 2016-10-11 MED ORDER — ASPIRIN-ACETAMINOPHEN-CAFFEINE 250-250-65 MG PO TABS
2.0000 | ORAL_TABLET | Freq: Four times a day (QID) | ORAL | Status: DC | PRN
  Administered 2016-10-12: 2 via ORAL
  Filled 2016-10-11 (×3): qty 2

## 2016-10-11 NOTE — Progress Notes (Signed)
PROGRESS NOTE   Marcia Thornton  ZOX:[id number]    DOB: 12/30/1980    DOA: 09/27/2016  PCP: No primary care provider on file.   I have briefly reviewed patients previous medical [facility] in [facility].  Brief Narrative:  36 year old female with PMH of chronic kidney disease, HTN, medical noncompliance, resident of [place], [place] [facility] [facility], admitted initially to [facility] for hypertensive emergency and subsequently transferred to [facility] for nephrology consultation and HD initiation. She developed uremic symptoms 5/23, an urgent right IJ HD catheter was placed and HD was initiated. Outpatient HD arranged by nephrology and after discussing with patient, discharge paperwork was completed on 6/1. She decided to stay overnight due to [place]. Overnight 6/1, developed some worsening nausea, vomiting and headache. Ongoing refusal with several aspects of care.  Assessment & Plan:   Active Problems:   Hypertensive emergency   1. ESRD: Nephrology follow-up appreciated. New start to HD. TTS HD while inpatient. Volume management across dialysis, volume status improved. Right IJ TDC placed on 5/24 and she underwent left radial cephalic AV fistula on 5/25. Outpatient HD arranged by nephrology and after discussing with patient, discharge paperwork was completed on 6/1. She decided to stay overnight due to [place] drive [place] [facility] [place] [place]. Overnight 6/1, developed some worsening nausea, vomiting and headache. Refusing HD today because she feels unwell. 2. Hypertensive emergency: Volume management across dialysis. On polypharmacy antihypertensives but continues to have fluctuating and elevated blood pressures, in some part due to her refusal to take medications at their scheduled time and takes them when she feels like it. Currently on amlodipine 10 MG daily, carvedilol 25 MG twice a day, clonidine TD 0.3 MG, furosemide 80 MG daily, hydralazine 100 MG  every 8 hours, lisinopril increased to 40 MG daily-patient refused. 3. Anemia: Secondary to chronic kidney disease. Status post PRBC transfusion. On IV Aranesp and iron per nephrology. Hemoglobin improved. 4. Hyponatremia: Likely related to ESRD and new HD. Stable and asymptomatic. Follow across HD. 5. Acute respiratory failure with hypoxia: Secondary to pulmonary edema from ESRD. Resolved. 6. Medical noncompliance: Counseled regarding importance of compliance with all aspects of medical care. 7. Chest pain: Resolved. Troponin negative. Patient declined echocardiogram. 8. Nausea and vomiting, recurrent: Ultrasound abdomen did not show acute pathology. Gives 3 weeks history of intermittent nausea and vomiting which had improved a couple days ago only to worsen in the last 1-2 days. Unclear etiology.? Uremia, new dialysis, migraine, uncontrolled hypertension. Requested abdominal x-ray to rule out constipation/ileus which patient refused but had a large BM on 6/1. Change Protonix to IV. Continue when necessary Zofran and Phenergan. 9. Neck pain/back pain: States started after right IJ HD catheter placed.? Positional and seems musculoskeletal in nature. Trial of heating pad and scheduled Tylenol. Nephrologist also evaluated her and feels that this is musculoskeletal. Neck pain had improved but again reports no change today. Stiffness/spasm of right neck strap muscles but no other acute findings. Discussed with nephrology and we'll obtain C-spine x-ray-patient refused. 10. Headaches/migraine: Resume patient's home Excedrin Migraine, patient refused.   DVT prophylaxis: Subcutaneous heparin Code Status: Full Family Communication: Discussed with significant other at bedside. Disposition: DC home pending clinical improvement.   Consultants:  Nephrology Vascular surgery   Procedures:  Right IJ HD catheter placement Left upper extremity AV fistula placement HD  Antimicrobials:  None     Subjective: Overnight nausea and nonbloody emesis times 3-4. Upper abdominal soreness from vomiting. Had  not had a BM since 5/26 but had a large BM on 6/1. States that whenever her blood pressures are elevated, she develops a headache. Refusing multiple medications and imaging studies. Refused HD.  ROS: No dyspnea or chest pain reported.  Objective:  Vitals:   10/10/16 2051 10/11/16 0518 10/11/16 0521 10/11/16 1308  BP: (!) 142/91 (!) 183/103 (!) 168/115 (!) 155/104  Pulse: 84 91 88   Resp: 17 18    Temp: 98.5 F (36.9 C) 98.2 F (36.8 C)    TempSrc: Oral Oral    SpO2: 97% 100%    Weight:  68.8 kg (151 lb 10.8 oz)    Height:        Examination:  General exam: Pleasant young female lying in reclining chair. Appears in no respiratory distress but uncomfortable Respiratory system: Clear to auscultation. Respiratory effort normal. Right IJ TDC site without acute findings. Cardiovascular system: S1 & S2 heard, RRR. No JVD, murmurs, rubs, gallops or clicks. No pedal edema. Gastrointestinal system: Abdomen is nondistended, soft and mild epigastric tenderness without rigidity, guarding or rebound. No organomegaly or masses felt. Normal bowel sounds heard. Central nervous system: Alert and oriented. No focal neurological deficits. Extremities: Symmetric 5 x 5 power. LUE AV fistula site with good thrill and no acute findings. Skin: No rashes, lesions or ulcers Psychiatry: Judgement and insight appear normal. Mood & affect appropriate.  Musculoskeletal system: Stiffness/spasm of strap muscles on right side of neck without any other acute findings.    Data Reviewed: I have personally reviewed following labs and imaging studies  CBC:  Recent Labs Lab 10/04/16 1912 10/07/16 1645 10/09/16 0713  WBC 13.4* 10.2 10.6*  HGB 8.3* 8.0* 9.5*  HCT 24.5* 23.3* 28.0*  MCV 83.1 81.5 82.6  PLT 355 336 355   Basic Metabolic Panel:  Recent Labs Lab 10/04/16 1911 10/07/16 1645  10/09/16 0713  NA 131* 129* 131*  K 3.6 4.0 4.0  CL 94* 92* 94*  CO2 24 23 23   GLUCOSE 99 105* 92  BUN 52* 69* 38*  CREATININE 7.39* 8.58* 7.14*  CALCIUM 8.7* 8.5* 9.2  PHOS 5.7* 7.8* 7.1*   Liver Function Tests:  Recent Labs Lab 10/04/16 1911 10/07/16 1645 10/09/16 0713  ALBUMIN 3.3* 3.3* 3.5   Cardiac Enzymes: No results for input(s): CKTOTAL, CKMB, CKMBINDEX, TROPONINI in the last 168 hours.      Radiology Studies: No results found.      Scheduled Meds: . amLODipine  10 mg Oral Daily  . calcium acetate  1,334 mg Oral TID WC  . carvedilol  25 mg Oral BID WC  . cloNIDine  0.3 mg Transdermal Weekly  . darbepoetin (ARANESP) injection - DIALYSIS  150 mcg Intravenous Q Tue-HD  . furosemide  80 mg Oral Daily  . heparin  5,000 Units Subcutaneous Q8H  . hydrALAZINE  100 mg Oral Q8H  . [START ON 10/12/2016] lisinopril  40 mg Oral Daily  . multivitamin  1 tablet Oral QHS  . pantoprazole (PROTONIX) IV  40 mg Intravenous Q24H  . polyethylene glycol  17 g Oral Daily  . senna-docusate  2 tablet Oral BID   Continuous Infusions: . sodium chloride    . sodium chloride    . ferric gluconate (FERRLECIT/NULECIT) IV 125 mg (10/07/16 1802)     LOS: 14 days     Kasondra Junod, MD, FACP, FHM. [facility] Pager [phone]  If 7PM-7AM, please contact night-coverage [email or web address] Password [email or web address] 10/11/2016, 1:32 PM

## 2016-10-11 NOTE — Progress Notes (Signed)
Patient refused dialysis today. She said she is not feeling well and she doesn't want to go. MD made aware. Will continue to monitor.

## 2016-10-11 NOTE — Progress Notes (Signed)
Patient refused to go to Radiology for abdominal Xray. She said she will go later when she will feel better on her stomach. MD made aware. Will continue to monitor.

## 2016-10-11 NOTE — Progress Notes (Signed)
HD attempted report, informed per bedside RN that pt refused HD again this afternoon.  Nephrology made aware.

## 2016-10-11 NOTE — Progress Notes (Signed)
Patient complaining of abdominal pain and she continue to have episodes of vomiting. RN explained to the patient that she must have done abdominal Xray, so the doctor will be able to see what makes her feeling bad. She was very upset and she said to call the radiology to let them know that she will go for an Xray. RN call radiology and told them that the patient agreed to have Xray done. Will continue to monitor.

## 2016-10-11 NOTE — Progress Notes (Signed)
Patient ID: Marcia Thornton, female   DOB: 01/17/1981, 36 y.o.   MRN: [id number]  [facility] Progress Note   Assessment/ Plan:   1. Volume overload/hypertensive emergency: Volume status better with ultrafiltration and hemodialysis however, she continues to struggle with elevated blood pressure in part because of her refusal to take medications at their scheduled time. Reiterated on the importance of adherence and will increase lisinopril to 40 mg daily. 2. ESRD: [place] [facility] [place] on [place]. [facility] and has a left RCF that was placed on 10/03/16. We discussed for her to undertake dialysis today however at this time, she expresses some reluctance because of how she is feeling at this time. We'll leave the orders in place and attempt to call her for dialysis later this afternoon as symptoms improve. 3. Anemia: Status post PRBC transfusion, currently on Aranesp. Continue intravenous iron. 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 5. Nausea/vomiting/abdominal pain: Getting an abdominal plain film today and is status post laxative as her last bowel movement was 6 days ago  Subjective:   Discharge paperwork was done yesterday afternoon after successful placement of outpatient dialysis unit however, the patient decided to stay overnight because of the long drive [place]. Overnight developed worsening vomiting with recurrent headache and neck pain.    Objective:   BP (!) 168/115   Pulse 88   Temp 98.2 F (36.8 C) (Oral)   Resp 18   Ht 5\' 4"  (1.626 m)   Wt 68.8 kg (151 lb 10.8 oz)   LMP 09/18/2016 Comment: HCG of 6.7, but pt states she is not sexually active.  SpO2 100%   BMI 26.04 kg/m   Physical Exam: WUJ:[id number]:Appears to be uncomfortable resting in bed CVS: Pulse regular rhythm, normal rate, S1 and S2 normal Resp: Clear to auscultation-no  distinct rales. Right IJ TDC Abd: Soft, obese, nontender Ext: No LE edema. Left RCF positive thrill.  Labs: BMET  Recent Labs Lab 10/04/16 1911 10/07/16 1645 10/09/16 0713  NA 131* 129* 131*  K 3.6 4.0 4.0  CL 94* 92* 94*  CO2 24 23 23   GLUCOSE 99 105* 92  BUN 52* 69* 38*  CREATININE 7.39* 8.58* 7.14*  CALCIUM 8.7* 8.5* 9.2  PHOS 5.7* 7.8* 7.1*   CBC  Recent Labs Lab 10/04/16 1912 10/07/16 1645 10/09/16 0713  WBC 13.4* 10.2 10.6*  HGB 8.3* 8.0* 9.5*  HCT 24.5* 23.3* 28.0*  MCV 83.1 81.5 82.6  PLT 355 336 355    Medications:    . amLODipine  10 mg Oral Daily  . calcium acetate  1,334 mg Oral TID WC  . carvedilol  25 mg Oral BID WC  . cloNIDine  0.3 mg Transdermal Weekly  . darbepoetin (ARANESP) injection - DIALYSIS  150 mcg Intravenous Q Tue-HD  . furosemide  80 mg Oral Daily  . heparin  5,000 Units Subcutaneous Q8H  . hydrALAZINE  100 mg Oral Q8H  . lisinopril  20 mg Oral Daily  . multivitamin  1 tablet Oral QHS  . pantoprazole (PROTONIX) IV  40 mg Intravenous Q24H  . polyethylene glycol  17 g Oral Daily  . senna-docusate  2 tablet Oral BID   Zetta BillsJay Fredie Majano, MD 10/11/2016, 9:53 AM

## 2016-10-12 NOTE — Progress Notes (Signed)
Progress note  Patient was interviewed and examined this morning along with her RN. Significant other at bedside. Please see updated DC summary for history, physical, updates at discharge.  Marcellus ScottHONGALGI,Marcia Pietila, MD, FACP, FHM. [facility] Pager [phone]  If 7PM-7AM, please contact night-coverage [email or web address] Password [email or web address] 10/12/2016, 3:29 PM

## 2016-10-12 NOTE — Progress Notes (Signed)
Faith Rogueiffany Pustejovsky to be D/C'd home per MD order.  Discussed with the patient and all questions fully answered.  VSS, Skin clean, dry and intact without evidence of skin break down, no evidence of skin tears noted. IV catheter discontinued intact. Site without signs and symptoms of complications. Dressing and pressure applied. Hemodialysis IJ remains in place, dressing clean, dry & intact.  An After Visit Summary was printed and given to the patient. Patient received prescription.  D/c education completed with patient/family including follow up instructions, medication list, d/c activities limitations if indicated, with other d/c instructions as indicated by MD - patient able to verbalize understanding, all questions fully answered.   Patient instructed to return to ED, call 911, or call MD for any changes in condition.   Patient escorted via WC, and D/C home via private auto.  Evern BioLoren D Latesha Chesney 10/12/2016 11:57 AM

## 2016-10-12 NOTE — Discharge Summary (Signed)
Physician Discharge Summary  Marcia Thornton ZOX:[id number] DOB: [birth year]  PCP: No primary care provider on file.  Admit date: 09/27/2016 Discharge date: 10/12/2016  Recommendations for Outpatient Follow-up:  1. Marcia [place]: This is patient's outpatient hemodialysis center. Patient has been given paperwork with the exact location/address of the dialysis unit by the nephrology service here. First treatment appointment is on Monday, 10/13/2016 at 4:45 PM. Tentative dialysis schedule thereafter is Mondays, Wednesdays, Fridays at 5:10 PM. 2. Dr. Fabienne Bruns, Vascular Surgery in 6 weeks.  Home Health: None Equipment/Devices: None    Discharge Condition: Improved and stable  CODE STATUS: Full  Diet recommendation: Heart healthy diet.  Discharge Diagnoses:  Active Problems:   Hypertensive emergency   Brief Summary: 36 year old female with PMH of chronic kidney disease, HTN, medical noncompliance, resident of [place], follows with Nephrology (Dr. Prentiss Thornton) in [facility], admitted initially to [facility] for hypertensive emergency and subsequently transferred to [facility] for Nephrology consultation and HD initiation. She developed uremic symptoms 5/23, an urgent right IJ HD catheter was placed and HD was initiated.  Assessment & Plan:   1. ESRD: Nephrology follow-up appreciated. New start to HD. TTS HD while inpatient. Volume management across dialysis, volume status improved. Right IJ TDC placed on 5/24 and she underwent left radial cephalic AV fistula on 5/25. Unfortunately patient's primary Nephrologist is in [place] and does not provide services in [place]. She had been visiting [place] and plans to return today to her home in [place]. Outpatient HD arranged by Nephrology and after discussing with patient, discharge paperwork was completed on 6/1. She decided to stay overnight due to long drive back to [facility]. Overnight 6/1,  developed some worsening nausea, vomiting and headache. She refused HD on 6/2. She indicated that she wants to be discharged today. Discussed with nephrology who have cleared her for discharge. She is advised to keep up her outpatient dialysis appointment for tomorrow. She and her significant other verbalized understanding. 2. Hypertensive emergency: Volume management across dialysis. On polypharmacy antihypertensives but continues to have fluctuating and elevated blood pressures, in some part due to her refusal to take medications at their scheduled time and takes them when she feels like it. She is being discharged on medications as outlined below. These will need to be closely monitored outpatient and adjusted as needed. Based on compliance and ongoing dialysis, it is possible that she may be able to reduce doses and even come off a couple of them.  3. Anemia: Secondary to chronic kidney disease. Status post PRBC transfusion. Treated with IV Aranesp and iron per nephrology. Hemoglobin improved. Follow CBCs periodically across dialysis. 4. Hyponatremia: Likely related to ESRD and new HD. Stable and asymptomatic. Follow across HD. 5. Acute respiratory failure with hypoxia: Secondary to pulmonary edema from ESRD. Resolved. 6. Medical noncompliance:  multiple medical personnel in the hospital have counseled her repeatedly regarding compliance with all aspects of medical care. 7. Chest pain: Resolved. Troponin negative. Patient declined echocardiogram which can be pursued with her PCP or nephrologist in [place]. 8. Nausea and vomiting: Ultrasound abdomen did not show acute pathology. Gives 3 weeks history of intermittent nausea and vomiting which had improved a couple days ago only to worsen in 2 days ago. Unclear etiology.? Uremia, new dialysis, migraine, uncontrolled hypertension. Abdominal x-ray showed mild constipation. She had 2 large BMs yesterday. Improved. 9. Neck pain/back pain: States started after  right IJ HD catheter placed.? Positional and seems musculoskeletal in nature.  Nephrologist also evaluated her and feels that this is musculoskeletal. Continue to complain of ongoing right-sided neck pain yesterday. Apart from some stiffness/spasm of right neck strap muscles, there were no acute findings. C-spine x-ray shows mild degenerative changes without acute abnormality. States that it is better today. 10. Headaches/migraine: Patient indicates that she takes Excedrin Migraine up to 2 times per day several times a week.? Headache related to rebound. Resume home dose of Excedrin Migraine.    Consultants:  Nephrology Vascular surgery   Procedures:  Right IJ HD catheter placement Left upper extremity AV fistula placement HD   Discharge Instructions  Discharge Instructions    (HEART FAILURE PATIENTS) Call MD:  Anytime you have any of the following symptoms: 1) 3 pound weight gain in 24 hours or 5 pounds in 1 week 2) shortness of breath, with or without a dry hacking cough 3) swelling in the hands, feet or stomach 4) if you have to sleep on extra pillows at night in order to breathe.    Complete by:  As directed    Call MD for:  difficulty breathing, headache or visual disturbances    Complete by:  As directed    Call MD for:  extreme fatigue    Complete by:  As directed    Call MD for:  persistant dizziness or light-headedness    Complete by:  As directed    Call MD for:  persistant nausea and vomiting    Complete by:  As directed    Call MD for:  redness, tenderness, or signs of infection (pain, swelling, redness, odor or green/yellow discharge around incision site)    Complete by:  As directed    Call MD for:  severe uncontrolled pain    Complete by:  As directed    Call MD for:  temperature >100.4    Complete by:  As directed    Diet - low sodium heart healthy    Complete by:  As directed    Increase activity slowly    Complete by:  As directed        Medication List     TAKE these medications   amLODipine 10 MG tablet Commonly known as:  NORVASC Take 10 mg by mouth daily.   aspirin-acetaminophen-caffeine 250-250-65 MG tablet Commonly known as:  EXCEDRIN MIGRAINE Take 2 tablets by mouth every 6 (six) hours as needed for headache or migraine.   calcitRIOL 0.25 MCG capsule Commonly known as:  ROCALTROL Take 0.25 mcg by mouth daily.   calcium acetate 667 MG capsule Commonly known as:  PHOSLO Take 2 capsules (1,334 mg total) by mouth 3 (three) times daily with meals.   carvedilol 25 MG tablet Commonly known as:  COREG Take 25 mg by mouth 2 (two) times daily with a meal.   cloNIDine 0.2 MG tablet Commonly known as:  CATAPRES Take 0.2 mg by mouth 2 (two) times daily.   furosemide 80 MG tablet Commonly known as:  LASIX Take 1 tablet (80 mg total) by mouth daily.   hydrALAZINE 100 MG tablet Commonly known as:  APRESOLINE Take 1 tablet (100 mg total) by mouth 3 (three) times daily.   lisinopril 20 MG tablet Commonly known as:  PRINIVIL,ZESTRIL Take 1 tablet (20 mg total) by mouth daily.   multivitamin Tabs tablet Take 1 tablet by mouth at bedtime.   pantoprazole 40 MG tablet Commonly known as:  PROTONIX Take 1 tablet (40 mg total) by mouth daily at 6 PM.  promethazine 12.5 MG tablet Commonly known as:  PHENERGAN Take 1 tablet (12.5 mg total) by mouth every 6 (six) hours as needed for nausea or vomiting.      Follow-up Information    Sherren KernsFields, Charles E, MD Follow up in 6 week(s).   Specialties:  Vascular Surgery, Cardiology Why:  Office will call you to arrange your appt (sent) Contact information: [address] [place] [address] [phone]        [facility] [address] [facility] [address] [facility] [place] Follow up on 10/13/2016.   Why:  Go to this dialysis Center for ongoing treatments. 1st treatment Monday October 13 2016 at 4:45 pm .Tentative dialysis schedule Monday Wednesday Friday at 5:10pm.         No Known  Allergies    Procedures/Studies: Dg Cervical Spine Complete  Result Date: 10/11/2016 CLINICAL DATA:  Neck pain for 1 week, initial encounter EXAM: CERVICAL SPINE - COMPLETE 4+ VIEW COMPARISON:  None. FINDINGS: Seven cervical segments are well visualized. Vertebral body height is well maintained. Mild osteophytic changes are noted at C4-5 and C5-6. No neural foraminal changes are seen. Tunneled right jugular dialysis catheter is noted in satisfactory position. No other bony abnormality is seen. IMPRESSION: Mild degenerative change without acute abnormality. Electronically Signed   By: Alcide CleverMark  Lukens M.D.   On: 10/11/2016 16:35   Dg Lumbar Spine Complete  Result Date: 10/06/2016 CLINICAL DATA:  RIGHT flank pain and discomfort, kidney disease, recently started dialysis EXAM: LUMBAR SPINE - COMPLETE 4+ VIEW COMPARISON:  None FINDINGS: Five non-rib-bearing lumbar vertebra. Osseous mineralization normal. Vertebral body and disc space heights maintained. Short pedicles of L5. No acute fracture, subluxation, or bone destruction. No spondylolysis. SI joints symmetric. IMPRESSION: No acute osseous abnormalities. Electronically Signed   By: Ulyses SouthwardMark  Boles M.D.   On: 10/06/2016 13:07   [place] Abdomen Complete  Result Date: 10/01/2016 CLINICAL DATA:  Chronic kidney disease, nausea and vomiting for 10 days EXAM: ABDOMEN ULTRASOUND COMPLETE COMPARISON:  None. FINDINGS: Gallbladder: No gallstones or wall thickening visualized. No sonographic Murphy sign noted by sonographer. Common bile duct: Diameter: 4.2 mm Liver: No focal lesion identified. Within normal limits in parenchymal echogenicity. IVC: No abnormality visualized. Pancreas: Visualized portion unremarkable. Spleen: Size and appearance within normal limits. Right Kidney: Length: 8.9 cm. Increased cortical echogenicity. No hydronephrosis. Left Kidney: Length: 10.1 cm. Increased cortical echogenicity. No hydronephrosis. Abdominal aorta: No aneurysm visualized. Other  findings: Incidental note made of small left pleural effusion IMPRESSION: 1. Negative for gallstones or biliary dilatation 2. Echogenic kidneys consistent with medical renal disease. No hydronephrosis 3. Incidental trace left pleural effusion Electronically Signed   By: Jasmine PangKim  Fujinaga M.D.   On: 10/01/2016 22:25   [place] Renal  Result Date: 10/06/2016 CLINICAL DATA:  Right flank pain for 1 week. History of hypertension chronic kidney disease. EXAM: RENAL / URINARY TRACT ULTRASOUND COMPLETE COMPARISON:  Abdominal ultrasound 10/01/2016. FINDINGS: Right Kidney: Length: 10.4 cm. There is diffusely increased cortical echogenicity as before. No focal lesion or hydronephrosis. Left Kidney: Length: 9.8 cm. There is diffusely increased cortical echogenicity as before. No focal lesion or hydronephrosis. Bladder: Mild nonspecific bladder wall thickening. Bilateral ureteral jets are noted. IMPRESSION: 1. No acute findings or hydronephrosis. 2. Echogenic kidneys consistent with chronic medical renal disease. 3. Nonspecific bladder wall thickening.  Correlate clinically. Electronically Signed   By: Carey BullocksWilliam  Veazey M.D.   On: 10/06/2016 13:37   Dg Chest Port 1 View  Result Date: 10/02/2016 CLINICAL DATA:  Dialysis catheter insertion EXAM:  PORTABLE CHEST 1 VIEW COMPARISON:  09/27/2016 FINDINGS: Stable cardiomegaly. Low volumes with atelectasis greater on the left. Permacatheter on the right with tip at the upper right atrium. No pneumothorax. IMPRESSION: 1. No acute finding after permacatheter placement. Tip is at the upper cavoatrial junction. 2. Chronic cardiomegaly. 3. Low volume chest with atelectasis. Electronically Signed   By: Marnee Spring M.D.   On: 10/02/2016 15:19   Dg Chest Port 1 View  Result Date: 09/27/2016 CLINICAL DATA:  Hypoxia EXAM: PORTABLE CHEST 1 VIEW COMPARISON:  None. FINDINGS: Cardiac shadow is mildly enlarged. The lungs are clear bilaterally. No focal infiltrate is seen. No bony abnormality is  noted. IMPRESSION: No acute abnormality noted. Electronically Signed   By: Alcide Clever M.D.   On: 09/27/2016 20:31   Dg Abd 2 Views  Result Date: 10/11/2016 CLINICAL DATA:  Abdominal pain for 1 week, initial encounter EXAM: ABDOMEN - 2 VIEW COMPARISON:  None. FINDINGS: Scattered large and small bowel gas is noted. No free air is seen. Fecal material is noted throughout the left colon consistent with a degree of constipation. No obstructive changes are seen. No bony abnormality is noted. IMPRESSION: Mild constipation. Electronically Signed   By: Alcide Clever M.D.   On: 10/11/2016 16:34   Dg Fluoro Guide Cv Line-no Report  Result Date: 10/02/2016 Fluoroscopy was utilized by the requesting physician.  No radiographic interpretation.      Subjective: Seen this morning. States that she feels better. Had 2 large BMs yesterday. No abdominal pain. No further nausea or vomiting since yesterday evening. Right-sided neck pain is better. Ongoing intermittent headaches. As per multiple RN reports, ongoing issues with refusal to take medications at their prescribed times, refused and did not have HD yesterday. She states that she would like to be discharged today so she can return to her home in [place] and is aware of her dialysis appointment tomorrow.  Discharge Exam:  Vitals:   10/11/16 1524 10/11/16 2123 10/11/16 2222 10/12/16 0605  BP: 122/80 111/64 125/81 127/81  Pulse: 91 92  89  Resp: 20 18  20   Temp: 98.3 F (36.8 C) 98.2 F (36.8 C)  98.4 F (36.9 C)  TempSrc:  Oral    SpO2: 100% 99%  99%  Weight:    68.8 kg (151 lb 10.8 oz)  Height:        General exam: Pleasant young female lying comfortably supine in bed. Respiratory system: Clear to auscultation. Respiratory effort normal. Right IJ TDC site without acute findings. Cardiovascular system: S1 & S2 heard, RRR. No JVD, murmurs, rubs, gallops or clicks. No pedal edema. Not on telemetry. Gastrointestinal system: Abdomen is  nondistended, soft and nontender. No organomegaly or masses felt. Normal bowel sounds heard. Central nervous system: Alert and oriented. No focal neurological deficits. Extremities: Symmetric 5 x 5 power. LUE AV fistula site with good thrill and no acute findings. Skin: No rashes, lesions or ulcers Psychiatry: Judgement and insight appear normal. Mood & affect appropriate.  Musculoskeletal system: Stiffness/spasm of strap muscles on right side of neck without any other acute findings.    The results of significant diagnostics from this hospitalization (including imaging, microbiology, ancillary and laboratory) are listed below for reference.     Labs: CBC:  Recent Labs Lab 10/07/16 1645 10/09/16 0713  WBC 10.2 10.6*  HGB 8.0* 9.5*  HCT 23.3* 28.0*  MCV 81.5 82.6  PLT 336 355   Basic Metabolic Panel:  Recent Labs Lab 10/07/16 1645 10/09/16 0713  NA 129* 131*  K 4.0 4.0  CL 92* 94*  CO2 23 23  GLUCOSE 105* 92  BUN 69* 38*  CREATININE 8.58* 7.14*  CALCIUM 8.5* 9.2  PHOS 7.8* 7.1*   Liver Function Tests:  Recent Labs Lab 10/07/16 1645 10/09/16 0713  ALBUMIN 3.3* 3.5   Discussed with patient's significant other at bedside. Updated care and answered questions. Patient and her significant other were advised to have her physicians in [place] get all her medical records from this hospitalization faxed over. They verbalized understanding.   Time coordinating discharge: Over 30 minutes  SIGNED:  Marcellus Scott, MD, FACP, FHM. [facility] Pager [phone]  If 7PM-7AM, please contact night-coverage [email or web address] Password [email or web address] 10/12/2016, 10:08 AM

## 2016-10-30 ENCOUNTER — Encounter: Payer: Self-pay | Admitting: Vascular Surgery

## 2016-10-30 ENCOUNTER — Encounter: Attending: Family | Primary: Internal Medicine

## 2016-11-11 ENCOUNTER — Other Ambulatory Visit: Payer: Self-pay | Admitting: *Deleted

## 2016-11-11 DIAGNOSIS — N186 End stage renal disease: Secondary | ICD-10-CM

## 2016-11-13 ENCOUNTER — Encounter: Payer: Self-pay | Admitting: Vascular Surgery

## 2016-11-13 ENCOUNTER — Ambulatory Visit (HOSPITAL_COMMUNITY): Payer: Self-pay

## 2017-01-14 ENCOUNTER — Encounter

## 2017-01-17 ENCOUNTER — Inpatient Hospital Stay: Payer: MEDICARE | Attending: Neurology | Primary: Internal Medicine

## 2017-01-19 ENCOUNTER — Inpatient Hospital Stay: Admit: 2017-01-19 | Payer: MEDICARE | Attending: Diagnostic Radiology | Primary: Internal Medicine

## 2017-01-19 DIAGNOSIS — G932 Benign intracranial hypertension: Secondary | ICD-10-CM

## 2017-01-19 LAB — GLUCOSE, CSF: Glucose,CSF: 66 MG/DL (ref 40–70)

## 2017-01-19 LAB — CELL COUNT, CSF
CSF RBCs: 1 /mm3 — ABNORMAL HIGH
CSF WBCs: 1 /mm3 (ref <5)

## 2017-01-19 LAB — PROTEIN, CSF: Protein,CSF: 27 MG/DL (ref 15–45)

## 2017-01-19 LAB — HCG QL SERUM: HCG, Ql.: NEGATIVE

## 2017-01-19 MED ORDER — LIDOCAINE (PF) 10 MG/ML (1 %) IJ SOLN
10 mg/mL (1 %) | Freq: Once | INTRAMUSCULAR | Status: AC
Start: 2017-01-19 — End: 2017-01-19
  Administered 2017-01-19: 16:00:00 via SUBCUTANEOUS

## 2017-01-19 MED FILL — LIDOCAINE (PF) 10 MG/ML (1 %) IJ SOLN: 10 mg/mL (1 %) | INTRAMUSCULAR | Qty: 30

## 2017-01-19 NOTE — Progress Notes (Signed)
Prepped and ready for procedure

## 2017-01-19 NOTE — Other (Signed)
TRANSFER - OUT REPORT:    Verbal report given to Jiles Prowserri Nelson (RN) (name) on Bethany Keller  being transferred to Heart Center/Cardiac Care Unit (unit) for routine progression of care       Report consisted of patient???s Situation, Background, Assessment and   Recommendations(SBAR).     Information from the following report(s) SBAR and Procedure Summary was reviewed with the receiving nurse.    Opportunity for questions and clarification was provided.      Patient transported with:  Virl SonSloan Gill RT(R) Tech

## 2017-01-19 NOTE — Progress Notes (Signed)
Arrived to care unit via wheelchair,  Pt take to bathroom for urine spec, pt states unable to void at this time.  Used bathroom  prior to arriving, Dr Esaw GrandchildVachanni called orders obtained

## 2017-01-19 NOTE — Progress Notes (Signed)
1200  Pt returned from ir,  Pt aaox3 complaining to headache,  States 7/10.  Pt took own Excedrin,  states ir md stated it was OK.  1250  Pt states head ache decreasing,  Lunch given, ice pack to right heel area for discomfort  1330  Sleeping  1400 sleeping  1420  Pt woken, discharge inst given and reviewed with pt and friend, both verbalize understanding, pt discharged via wheelchair to car in care of friend, arm bands removed and shredded

## 2017-01-20 LAB — CRYPTOCOCCAL AG, CSF W/REFLEX TITER: Cryptococcus Ag, CSF: NEGATIVE

## 2017-01-21 LAB — MULTIPLE SCLEROSIS PANEL

## 2017-01-24 LAB — CULTURE, CSF W GRAM STAIN
Culture result:: NO GROWTH
GRAM STAIN: NONE SEEN
GRAM STAIN: NONE SEEN

## 2017-07-21 IMAGING — US US ABDOMEN COMPLETE
1 series · 14 of 25 positions shown · non-contrast
Comparison: None.

CLINICAL DATA: Chronic kidney disease, nausea and vomiting for 10
days

EXAM:
ABDOMEN ULTRASOUND COMPLETE

[Series 1: us abdomen complete · 0.25mm/px · 14 of 97 slices shown]
[im 1/97]
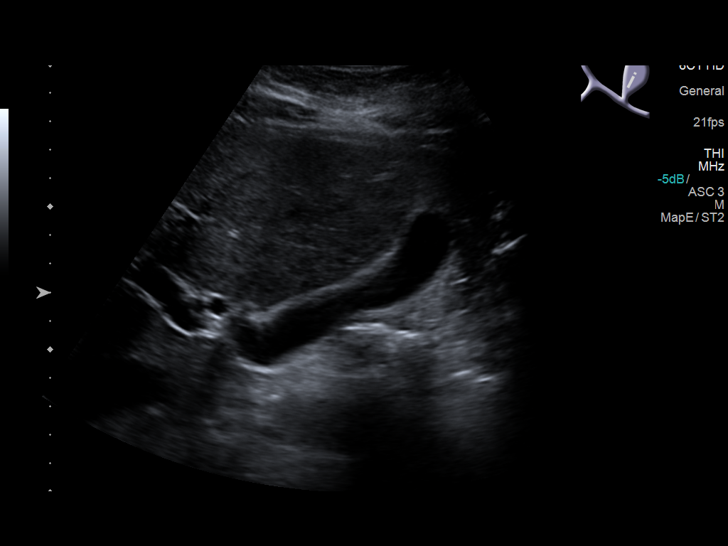
[im 9/97]
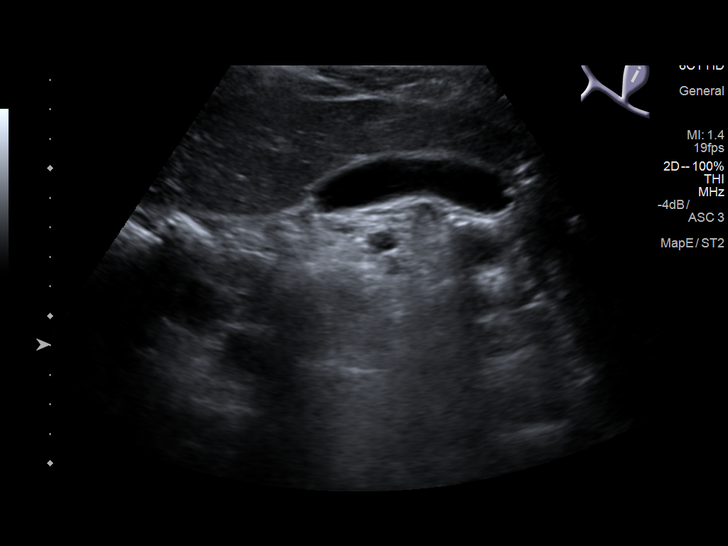
[im 17/97]
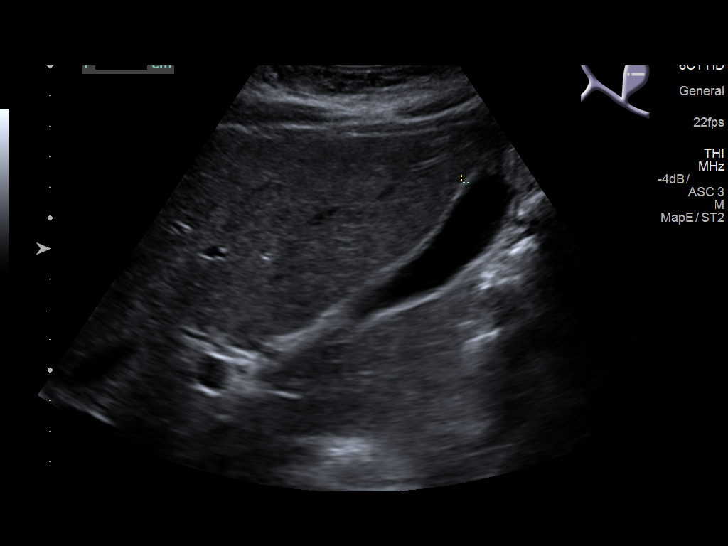
[im 25/97]
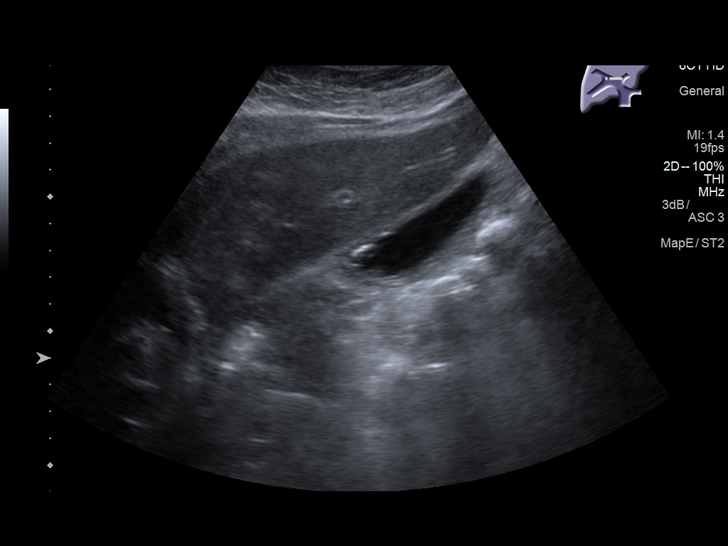
[im 33/97]
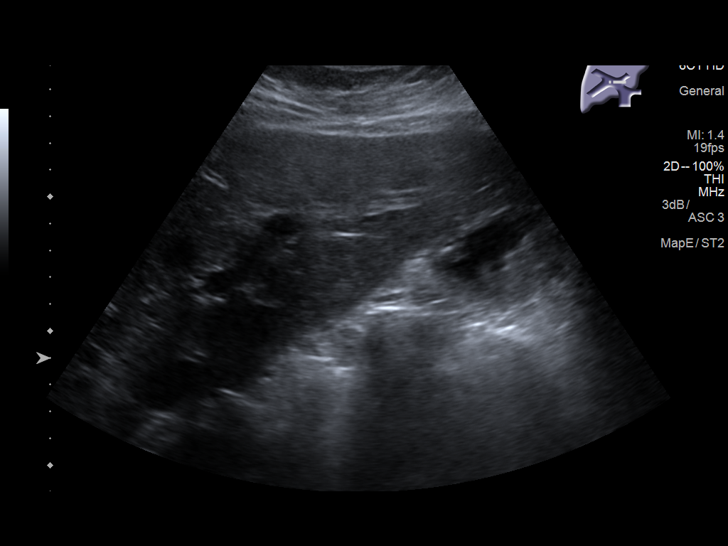
[im 37/97]
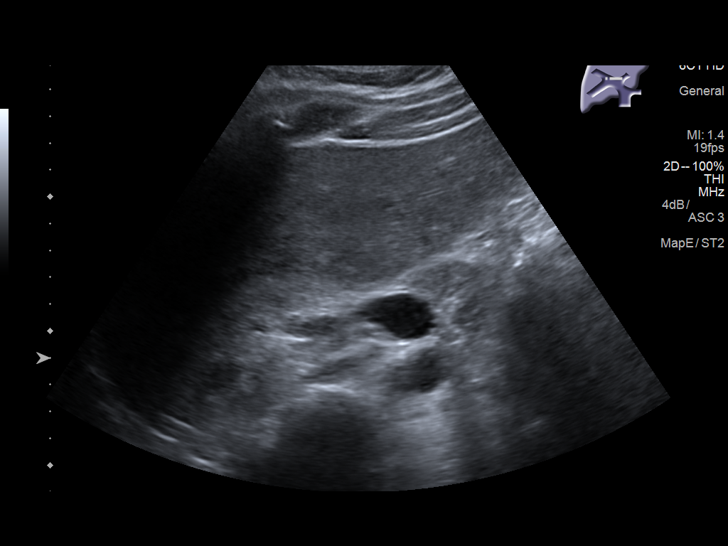
[im 45/97]
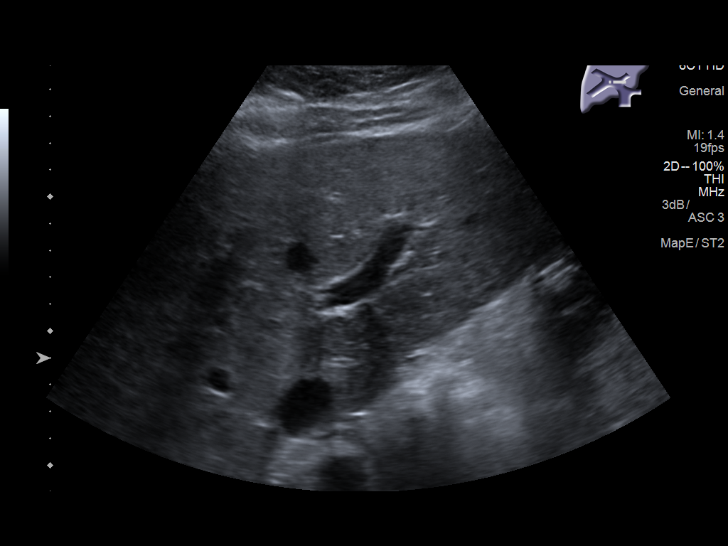
[im 53/97]
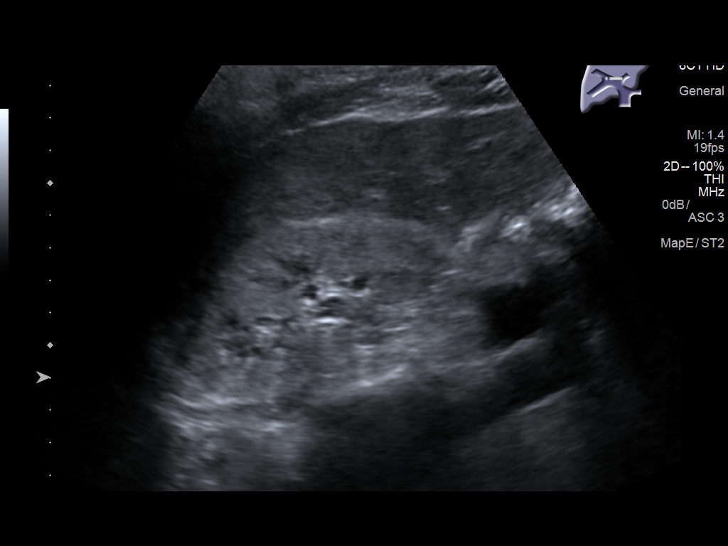
[im 61/97]
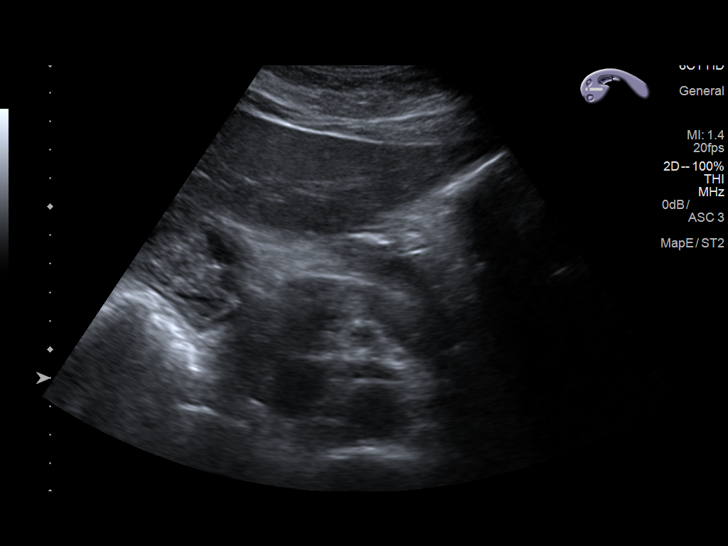
[im 65/97]
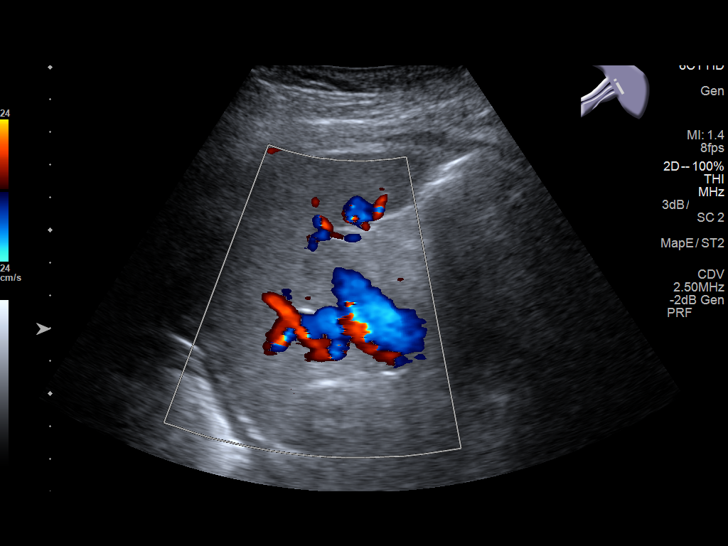
[im 73/97]
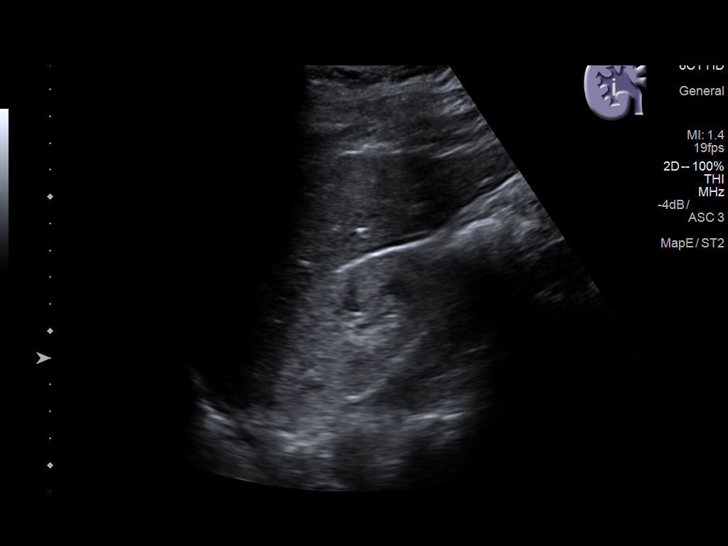
[im 81/97]
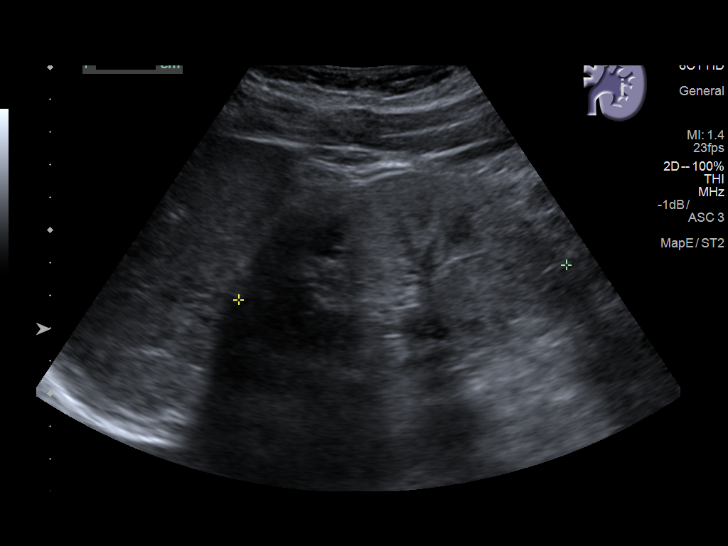
[im 89/97]
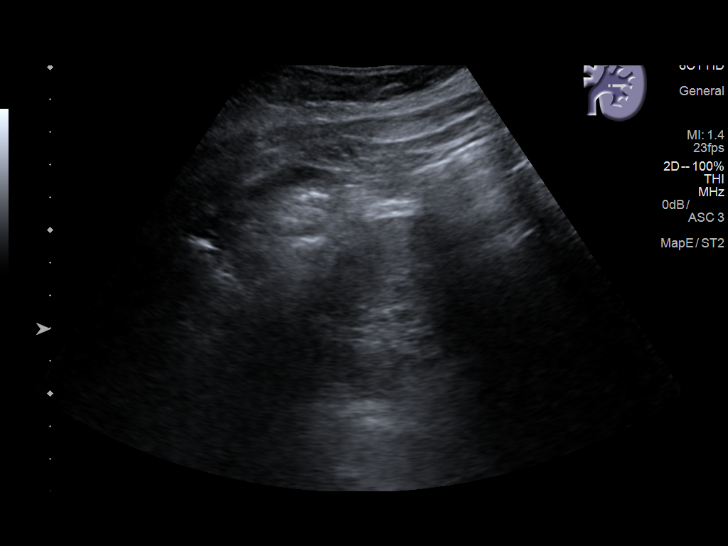
[im 97/97]
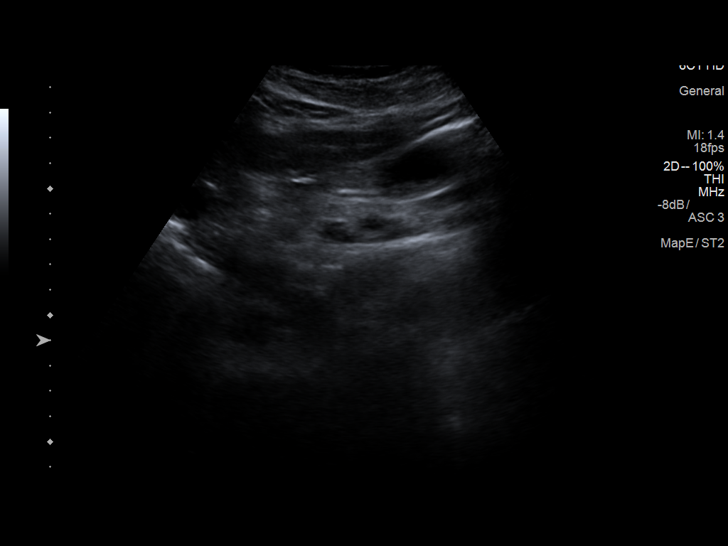

[14 of 25 positions shown; findings below may reference images not displayed]

FINDINGS: Gallbladder: No gallstones or wall thickening visualized. No
sonographic Murphy sign noted by sonographer.

Common bile duct: Diameter: 4.2 mm

Liver: No focal lesion identified. Within normal limits in
parenchymal echogenicity.

IVC: No abnormality visualized.

Pancreas: Visualized portion unremarkable.

Spleen: Size and appearance within normal limits.

Right Kidney: Length: 8.9 cm. Increased cortical echogenicity. No
hydronephrosis.

Left Kidney: Length: 10.1 cm. Increased cortical echogenicity. No
hydronephrosis.

Abdominal aorta: No aneurysm visualized.

Other findings: Incidental note made of small left pleural effusion
IMPRESSION: 1. Negative for gallstones or biliary dilatation
2. Echogenic kidneys consistent with medical renal disease. No
hydronephrosis
3. Incidental trace left pleural effusion

## 2017-07-26 IMAGING — US US RENAL
1 series · 14 of 25 positions shown · non-contrast
Comparison: Abdominal ultrasound 10/01/2016.

CLINICAL DATA: Right flank pain for 1 week. History of hypertension
chronic kidney disease.

EXAM:
RENAL / URINARY TRACT ULTRASOUND COMPLETE

[Series 1: us renal · 0.25mm/px · 14 of 33 slices shown]
[im 1/33]
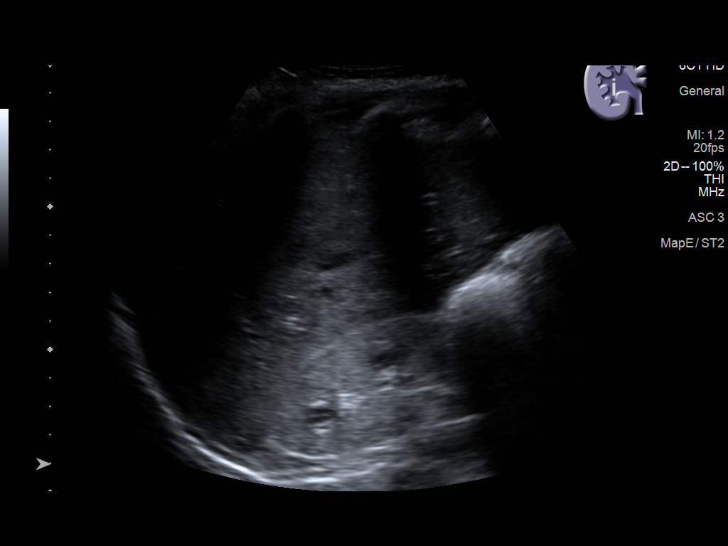
[im 3/33]
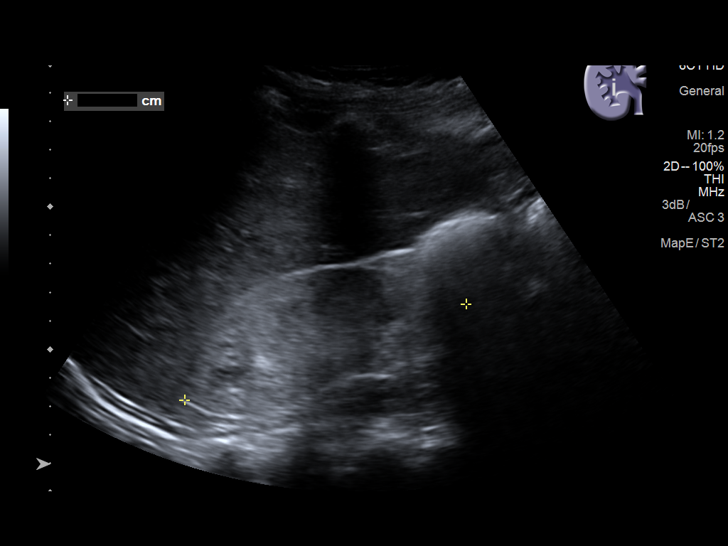
[im 6/33]
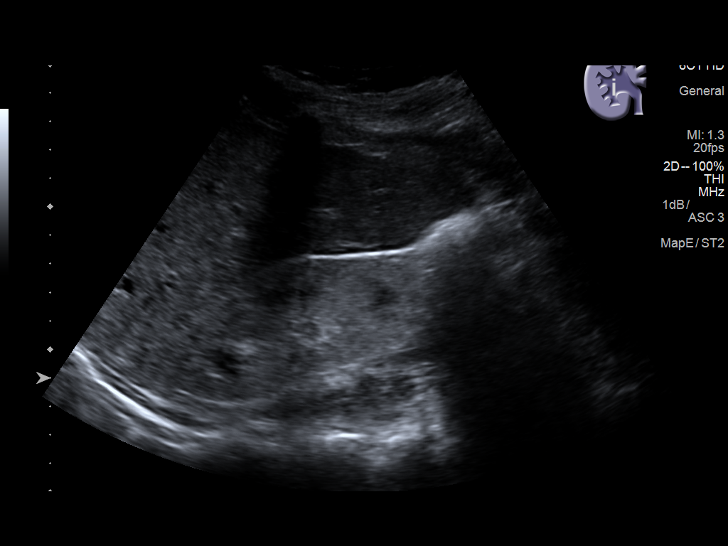
[im 9/33]
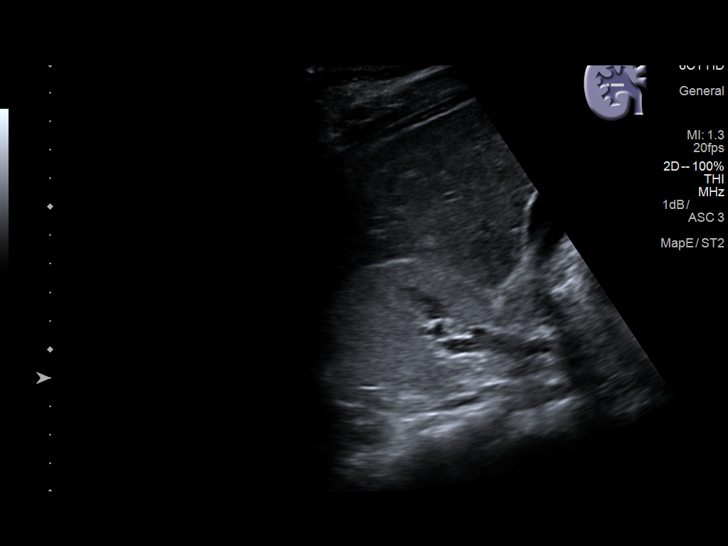
[im 11/33]
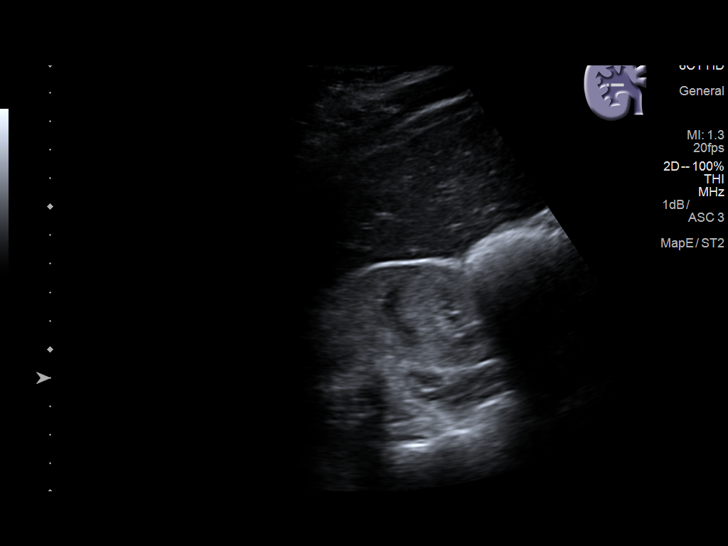
[im 13/33]
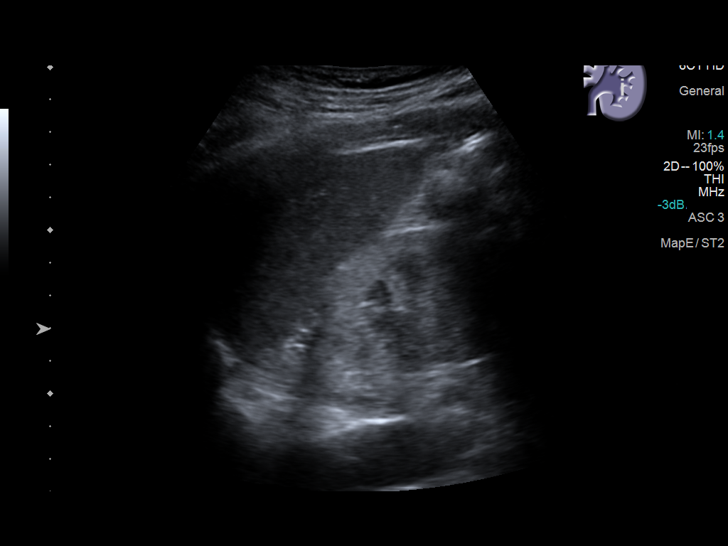
[im 15/33]
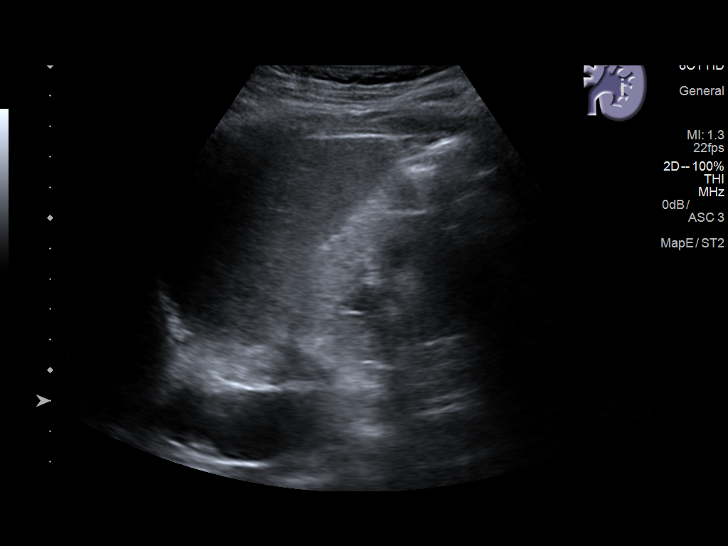
[im 18/33]
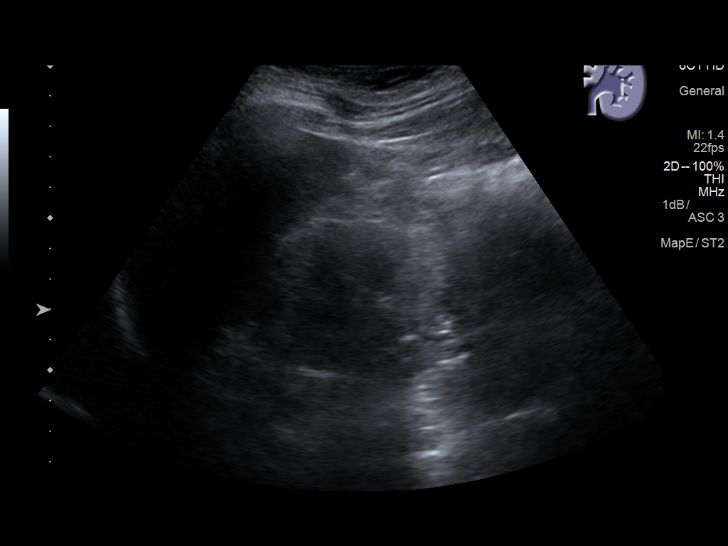
[im 21/33]
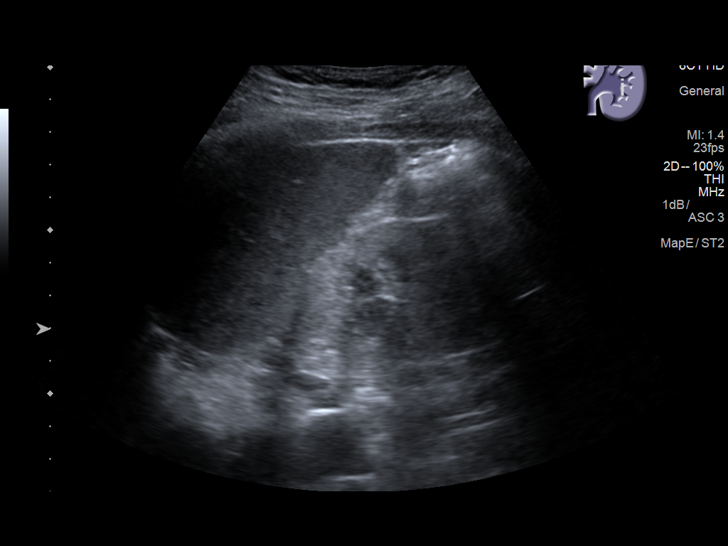
[im 22/33]
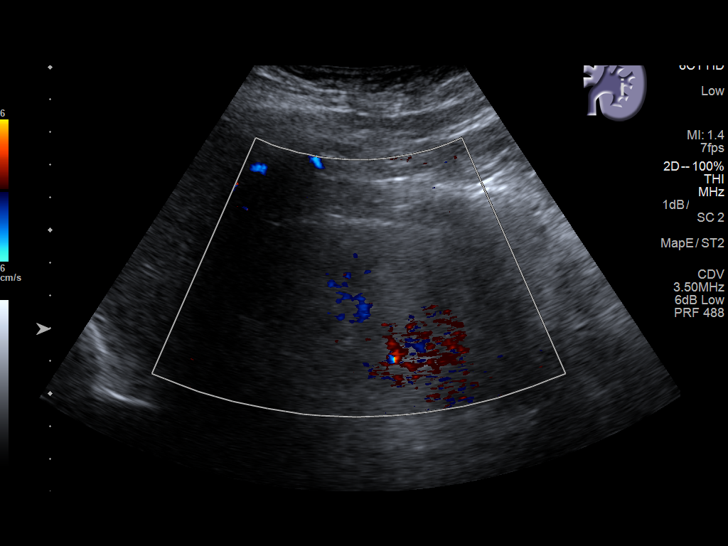
[im 25/33]
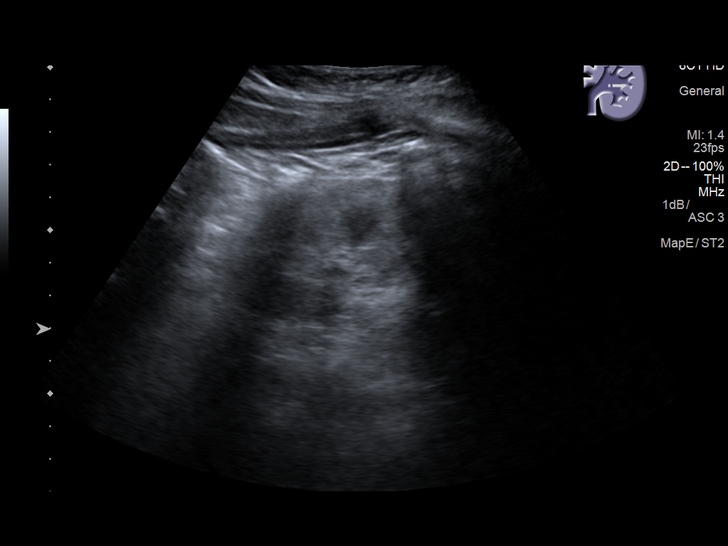
[im 27/33]
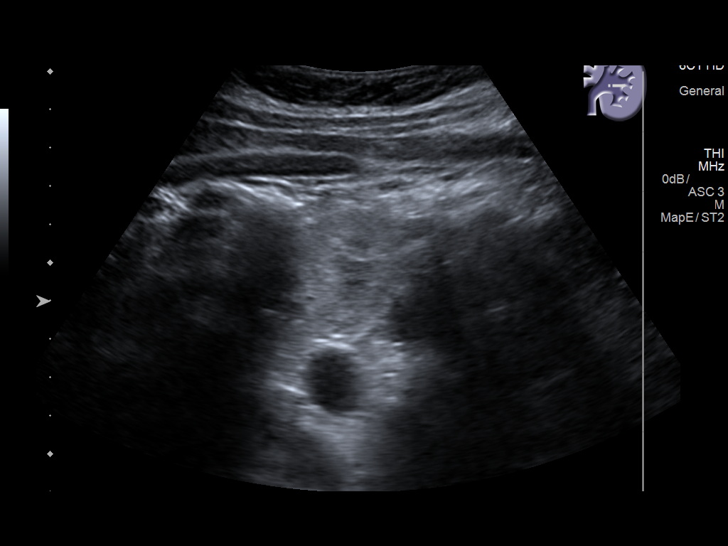
[im 30/33]
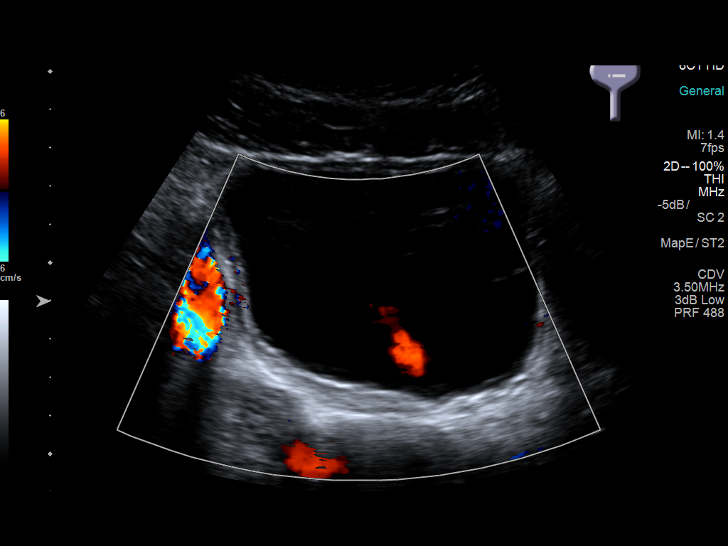
[im 33/33]
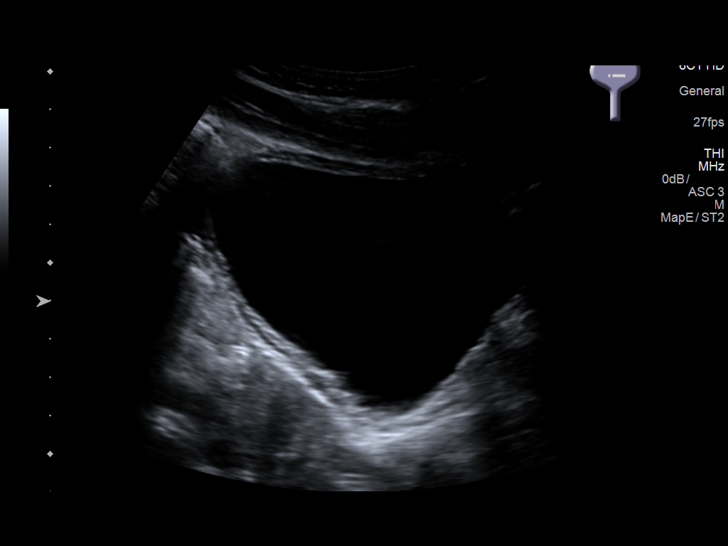

[14 of 25 positions shown; findings below may reference images not displayed]

FINDINGS: Right Kidney:

Length: 10.4 cm. There is diffusely increased cortical echogenicity
as before. No focal lesion or hydronephrosis.

Left Kidney:

Length: 9.8 cm. There is diffusely increased cortical echogenicity
as before. No focal lesion or hydronephrosis.

Bladder:

Mild nonspecific bladder wall thickening. Bilateral ureteral jets
are noted.
IMPRESSION: 1. No acute findings or hydronephrosis.
2. Echogenic kidneys consistent with chronic medical renal disease.
3. Nonspecific bladder wall thickening.  Correlate clinically.

## 2018-01-10 ENCOUNTER — Emergency Department: Admit: 2018-01-10 | Payer: MEDICARE | Primary: Internal Medicine

## 2018-01-10 ENCOUNTER — Inpatient Hospital Stay
Admit: 2018-01-10 | Discharge: 2018-01-11 | Disposition: A | Discharge status: Home / Self Care | Payer: MEDICARE | Attending: Emergency Medicine

## 2018-01-10 DIAGNOSIS — J810 Acute pulmonary edema: Secondary | ICD-10-CM

## 2018-01-10 LAB — POC CHEM8
Anion Gap, POC: 15 (ref 10–20)
Anion gap, POC: 15 (ref 10–20)
BUN, POC: 69 MG/DL — ABNORMAL HIGH (ref 7–18)
BUN, POC: 69 MG/DL — ABNORMAL HIGH (ref 7–18)
CO2, POC: 23 MMOL/L (ref 19–24)
Calcium, ionized (POC): 1.07 mmol/L — ABNORMAL LOW (ref 1.12–1.32)
Calcium, ionized, POC: 1.07 mmol/L — ABNORMAL LOW (ref 1.12–1.32)
Chloride, POC: 105 MMOL/L (ref 100–108)
Chloride, POC: 105 MMOL/L (ref 100–108)
Creatinine, POC: 16 MG/DL — ABNORMAL HIGH (ref 0.6–1.3)
Creatinine, POC: 16 MG/DL — ABNORMAL HIGH (ref 0.6–1.3)
GFR African American: 3 mL/min/{1.73_m2} — ABNORMAL LOW (ref >60)
GFR Non-African American: 3 mL/min/{1.73_m2} — ABNORMAL LOW (ref >60)
GFRAA, POC: 3 mL/min/{1.73_m2} — ABNORMAL LOW (ref >60)
GFRNA, POC: 3 mL/min/{1.73_m2} — ABNORMAL LOW (ref >60)
Glucose, POC: 84 MG/DL (ref 74–106)
Glucose, POC: 84 MG/DL (ref 74–106)
Hematocrit, POC: 39 % (ref 36–49)
Hematocrit, POC: 39 % (ref 36–49)
Hemoglobin, POC: 13.3 G/DL (ref 12–16)
Hemoglobin, POC: 13.3 G/DL (ref 12–16)
Potassium, POC: 7.4 MMOL/L (ref 3.5–5.5)
Potassium, POC: 7.4 MMOL/L — CR (ref 3.5–5.5)
Sodium, POC: 134 MMOL/L — ABNORMAL LOW (ref 136–145)
Sodium, POC: 134 MMOL/L — ABNORMAL LOW (ref 136–145)
TCO2, POC: 23 MMOL/L (ref 19–24)

## 2018-01-10 LAB — BASIC METABOLIC PANEL
Anion Gap: 9 mmol/L (ref 3.0–18)
BUN: 72 MG/DL — ABNORMAL HIGH (ref 7.0–18)
Bun/Cre Ratio: 5 — ABNORMAL LOW (ref 12–20)
CO2: 25 mmol/L (ref 21–32)
Calcium: 9.4 MG/DL (ref 8.5–10.1)
Chloride: 99 mmol/L — ABNORMAL LOW (ref 100–111)
Creatinine: 15 MG/DL — ABNORMAL HIGH (ref 0.6–1.3)
EGFR IF NonAfrican American: 3 mL/min/{1.73_m2} — ABNORMAL LOW (ref >60)
GFR African American: 3 mL/min/{1.73_m2} — ABNORMAL LOW (ref >60)
Glucose: 84 mg/dL (ref 74–99)
Potassium: 7.3 mmol/L (ref 3.5–5.5)
Sodium: 133 mmol/L — ABNORMAL LOW (ref 136–145)

## 2018-01-10 LAB — CBC WITH AUTO DIFFERENTIAL
Basophils %: 0 % (ref 0–2)
Basophils Absolute: 0 10*3/uL (ref 0.0–0.1)
Eosinophils %: 0 % (ref 0–5)
Eosinophils Absolute: 0 10*3/uL (ref 0.0–0.4)
Hematocrit: 36.3 % (ref 35.0–45.0)
Hemoglobin: 12.3 g/dL (ref 12.0–16.0)
Lymphocytes %: 12 % — ABNORMAL LOW (ref 21–52)
Lymphocytes Absolute: 1.2 10*3/uL (ref 0.9–3.6)
MCH: 29.3 PG (ref 24.0–34.0)
MCHC: 33.9 g/dL (ref 31.0–37.0)
MCV: 86.4 FL (ref 74.0–97.0)
MPV: 11.5 FL (ref 9.2–11.8)
Monocytes %: 3 % (ref 3–10)
Monocytes Absolute: 0.3 10*3/uL (ref 0.05–1.2)
Neutrophils %: 85 % — ABNORMAL HIGH (ref 40–73)
Neutrophils Absolute: 8.8 10*3/uL — ABNORMAL HIGH (ref 1.8–8.0)
Platelets: 273 10*3/uL (ref 135–420)
RBC: 4.2 M/uL (ref 4.20–5.30)
RDW: 15.1 % — ABNORMAL HIGH (ref 11.6–14.5)
WBC: 10.4 10*3/uL (ref 4.6–13.2)

## 2018-01-10 LAB — MAGNESIUM
Magnesium: 2.3 mg/dL (ref 1.6–2.6)
Magnesium: 2.3 mg/dL (ref 1.6–2.6)

## 2018-01-10 LAB — POCT GLUCOSE
POC Glucose: 57 mg/dL — ABNORMAL LOW (ref 70–110)
POC Glucose: 60 mg/dL — ABNORMAL LOW (ref 70–110)
POC Glucose: 77 mg/dL (ref 70–110)

## 2018-01-10 LAB — HCG QL SERUM
HCG, Ql.: NEGATIVE
HCG, Ql.: NEGATIVE

## 2018-01-10 LAB — TROPONIN: Troponin I: 0.02 NG/ML (ref 0.0–0.045)

## 2018-01-10 LAB — CBC WITH AUTOMATED DIFF
ABS. BASOPHILS: 0 10*3/uL (ref 0.0–0.1)
ABS. EOSINOPHILS: 0 10*3/uL (ref 0.0–0.4)
ABS. LYMPHOCYTES: 1.2 10*3/uL (ref 0.9–3.6)
ABS. MONOCYTES: 0.3 10*3/uL (ref 0.05–1.2)
ABS. NEUTROPHILS: 8.8 10*3/uL — ABNORMAL HIGH (ref 1.8–8.0)
BASOPHILS: 0 % (ref 0–2)
EOSINOPHILS: 0 % (ref 0–5)
HCT: 36.3 % (ref 35.0–45.0)
HGB: 12.3 g/dL (ref 12.0–16.0)
LYMPHOCYTES: 12 % — ABNORMAL LOW (ref 21–52)
MCH: 29.3 PG (ref 24.0–34.0)
MCHC: 33.9 g/dL (ref 31.0–37.0)
MCV: 86.4 FL (ref 74.0–97.0)
MONOCYTES: 3 % (ref 3–10)
MPV: 11.5 FL (ref 9.2–11.8)
NEUTROPHILS: 85 % — ABNORMAL HIGH (ref 40–73)
PLATELET: 273 10*3/uL (ref 135–420)
RBC: 4.2 M/uL (ref 4.20–5.30)
RDW: 15.1 % — ABNORMAL HIGH (ref 11.6–14.5)
WBC: 10.4 10*3/uL (ref 4.6–13.2)

## 2018-01-10 LAB — METABOLIC PANEL, BASIC
Anion gap: 9 mmol/L (ref 3.0–18)
BUN/Creatinine ratio: 5 — ABNORMAL LOW (ref 12–20)
BUN: 72 MG/DL — ABNORMAL HIGH (ref 7.0–18)
CO2: 25 mmol/L (ref 21–32)
Calcium: 9.4 MG/DL (ref 8.5–10.1)
Chloride: 99 mmol/L — ABNORMAL LOW (ref 100–111)
Creatinine: 15 MG/DL — ABNORMAL HIGH (ref 0.6–1.3)
GFR est AA: 3 mL/min/{1.73_m2} — ABNORMAL LOW (ref >60)
GFR est non-AA: 3 mL/min/{1.73_m2} — ABNORMAL LOW (ref >60)
Glucose: 84 mg/dL (ref 74–99)
Potassium: 7.3 mmol/L — CR (ref 3.5–5.5)
Sodium: 133 mmol/L — ABNORMAL LOW (ref 136–145)

## 2018-01-10 LAB — TROPONIN I: Troponin-I, QT: 0.02 NG/ML (ref 0.0–0.045)

## 2018-01-10 LAB — GLUCOSE, POC
Glucose (POC): 57 mg/dL — ABNORMAL LOW (ref 70–110)
Glucose (POC): 60 mg/dL — ABNORMAL LOW (ref 70–110)
Glucose (POC): 77 mg/dL (ref 70–110)

## 2018-01-10 MED ORDER — INSULIN REGULAR HUMAN 100 UNIT/ML INJECTION
100 unit/mL | INTRAMUSCULAR | Status: AC
Start: 2018-01-10 — End: 2018-01-10
  Administered 2018-01-10: 21:00:00 via INTRAVENOUS

## 2018-01-10 MED ORDER — SODIUM BICARBONATE 8.4 % IV
1 mEq/mL (8.4 %) | INTRAVENOUS | Status: AC
Start: 2018-01-10 — End: 2018-01-10
  Administered 2018-01-10: 21:00:00 via INTRAVENOUS

## 2018-01-10 MED ORDER — DEXTROSE 10% BOLUS IV (NEONATE)
10 % | Freq: Once | INTRAVENOUS | Status: AC
Start: 2018-01-10 — End: 2018-01-10
  Administered 2018-01-10: 21:00:00 via INTRAVENOUS

## 2018-01-10 MED ORDER — SODIUM POLYSTYRENE SULFONATE 15 GRAM/60 ML ORAL SUSPENSION
15 gram/60 mL | ORAL | Status: AC
Start: 2018-01-10 — End: 2018-01-10
  Administered 2018-01-10: 22:00:00 via ORAL

## 2018-01-10 MED ORDER — ALBUTEROL SULFATE 0.083 % (0.83 MG/ML) SOLN FOR INHALATION
2.5 mg /3 mL (0.083 %) | RESPIRATORY_TRACT | Status: AC
Start: 2018-01-10 — End: 2018-01-10
  Administered 2018-01-10: 21:00:00 via RESPIRATORY_TRACT

## 2018-01-10 MED ORDER — CALCIUM GLUCONATE 100 MG/ML (10%) IV SOLN
100 mg/mL (10%) | Freq: Once | INTRAVENOUS | Status: AC
Start: 2018-01-10 — End: 2018-01-10
  Administered 2018-01-10: 21:00:00 via INTRAVENOUS

## 2018-01-10 MED FILL — SODIUM BICARBONATE 8.4 % IV: 1 mEq/mL (8.4 %) | INTRAVENOUS | Qty: 50

## 2018-01-10 MED FILL — DEXTROSE 10% IN WATER (D10W) IV: 10 % | INTRAVENOUS | Qty: 250

## 2018-01-10 MED FILL — INSULIN REGULAR HUMAN 100 UNIT/ML INJECTION: 100 unit/mL | INTRAMUSCULAR | Qty: 1

## 2018-01-10 MED FILL — SODIUM POLYSTYRENE SULFONATE 15 GRAM/60 ML ORAL SUSPENSION: 15 gram/60 mL | ORAL | Qty: 120

## 2018-01-10 MED FILL — ALBUTEROL SULFATE 0.083 % (0.83 MG/ML) SOLN FOR INHALATION: 2.5 mg /3 mL (0.083 %) | RESPIRATORY_TRACT | Qty: 1

## 2018-01-10 MED FILL — CALCIUM GLUCONATE 100 MG/ML (10%) IV SOLN: 100 mg/mL (10%) | INTRAVENOUS | Qty: 10

## 2018-01-10 NOTE — ED Notes (Signed)
Provider aware of patient vital signs. No new orders at this time.

## 2018-01-10 NOTE — ED Notes (Signed)
Attempted to obtain blood and Iv access, unsuccessful

## 2018-01-10 NOTE — ED Notes (Signed)
Patient states she feels much better after dialysis.

## 2018-01-10 NOTE — ED Notes (Signed)
Patient stated she was feeling hot  . Patient appears diaphoretic. Will check blood sugar.

## 2018-01-10 NOTE — ED Notes (Signed)
Patient updated on plan of care. Patient expressed understanding. Will continue to monitor. Call bell in reach.

## 2018-01-10 NOTE — ED Notes (Signed)
Patient blood sugar 57. MD aware. Given juice. Will recheck in 

## 2018-01-10 NOTE — ED Notes (Signed)
I have reviewed discharge instructions with the patient.  The patient verbalized understanding.

## 2018-01-10 NOTE — ED Notes (Signed)
Pt presents for SOB at rest and with acitivity x 1 day. Pt denies cough, swelling. Had dialysis Friday, but did not finish bc of "machine issue." No recent travels, surgeries, no birth control

## 2018-01-10 NOTE — ED Notes (Signed)
Patient placed on 2L nasal canula. Oxygen saturation 85% on room air.

## 2018-01-10 NOTE — ED Notes (Signed)
Patient unhooked from monitor and ambulated to restroom with no issue.

## 2018-01-10 NOTE — ED Notes (Signed)
Blood sugar rechecked, 60. Patient given juice and crackers will recheck in 

## 2018-01-10 NOTE — ED Notes (Signed)
X ray at bedside

## 2018-01-10 NOTE — ED Provider Notes (Signed)
Sondra Barges  [facility] EMERGENCY DEPT      4:40 PM    Date: 01/10/2018  Patient Name: Bethany Keller    History of Presenting Illness     Chief Complaint   Patient presents with   ??? Shortness of Breath       37 y.o. female with noted past medical history who presents to the emergency department with shortness of breath this morning.  Patient states she was in her usual state of health and went to dialysis on Friday but because there was a "machine issue" she only had a partial run of 2 out of 4 hours.    She states that this morning she started to have some shortness of breath this persisted to the present.  Initially she had a minimal amount of chest discomfort this morning that is since resolved.  She states that shortness of breath and dyspnea exertion continue the present.  Her next hemodialysis run is scheduled for tomorrow 5:45 PM.    She has no URI symptoms.  She has no fever or chills.  She has no recent long trips as well.  She states this feels like prior times when she is been fluid overloaded.  To that end, she does admit to drinking more fluid over the last few days but has drank more because she said she vomited couple times.    Patient denies any other associated signs or symptoms.  Patient denies any other complaints.    Nursing notes regarding the HPI and triage nursing notes were reviewed.     Prior medical records were reviewed.     Current Facility-Administered Medications   Medication Dose Route Frequency Provider Last Rate Last Dose   ??? calcium gluconate injection 1 g  1 g IntraVENous Dema Severin, MD       ??? sodium bicarbonate (8.4%) injection 50 mEq  50 mEq IntraVENous NOW Suzanna Obey, MD       ??? insulin regular (NOVOLIN R, HUMULIN R) injection 10 Units  10 Units IntraVENous NOW Suzanna Obey, MD       ??? sodium polystyrene (KAYEXALATE) 15 gram/60 mL oral suspension 30 g  30 g Oral NOW Suzanna Obey, MD       ??? albuterol (PROVENTIL VENTOLIN) nebulizer solution 2.5 mg  2.5 mg  Nebulization NOW Suzanna Obey, MD         Current Outpatient Medications   Medication Sig Dispense Refill   ??? furosemide (LASIX) 80 mg tablet Take 40 mg by mouth every Tuesday Thursday, Saturday.     ??? spironolactone (ALDACTONE) 50 mg tablet Take 50 mg by mouth daily.     ??? aspirin/acetaminophen/caffeine (EXCEDRIN EXTRA STRENGTH PO) Take 2 Tabs by mouth.     ??? amLODIPine (NORVASC) 10 mg tablet Take 1 Tab by mouth daily. 90 Tab 3   ??? carvedilol (COREG) 25 mg tablet Take 1 Tab by mouth two (2) times daily (with meals). 180 Tab 3   ??? cloNIDine HCl (CATAPRES) 0.2 mg tablet Take 1 Tab by mouth two (2) times a day. 180 Tab 1       Past History     Past Medical History:  Past Medical History:   Diagnosis Date   ??? Acute parametritis and pelvic cellulitis 10/09/2011   ??? Chronic kidney disease    ??? Hypertension 10/09/2011       Past Surgical History:  Past Surgical History:   Procedure Laterality Date   ??? HX HEENT  2007  LASIK   ??? HX OTHER SURGICAL  2014    liposuction   ??? HX VASCULAR ACCESS      LUE fistula   ??? HX VASCULAR ACCESS  09/2016    tunneled dialysis catheter       Family History:  History reviewed. No pertinent family history.    Social History:  Social History     Tobacco Use   ??? Smoking status: Never Smoker   ??? Smokeless tobacco: Never Used   Substance Use Topics   ??? Alcohol use: Yes     Comment: socially   ??? Drug use: No       Allergies:  No Known Allergies    Patient's primary care provider (as noted in EPIC):  Deshmukh, Concepcion Elk, MD    Review of Systems   Constitutional: Negative for diaphoresis.   HENT: Negative for congestion.    Eyes: Negative for discharge.   Respiratory: Positive for shortness of breath. Negative for stridor.    Cardiovascular: Negative for palpitations.   Gastrointestinal: Negative for diarrhea.   Endocrine: Negative for heat intolerance.   Genitourinary: Negative for flank pain.   Musculoskeletal: Negative for back pain.   Neurological: Negative for weakness.    Psychiatric/Behavioral: Negative for hallucinations.   All other systems reviewed and are negative.      Visit Vitals  BP (!) 215/138 (BP 1 Location: Right arm, BP Patient Position: At rest)   Pulse 83   Temp 98.9 ??F (37.2 ??C)   Resp 22   Wt 68 kg (150 lb)   SpO2 (!) 88%   BMI 24.96 kg/m??       PHYSICAL EXAM:    CONSTITUTIONAL:  Alert, in no apparent distress;  well developed;  well nourished.  HEAD:  Normocephalic, atraumatic.  EYES:  EOMI.  Non-icteric sclera.  Normal conjunctiva.  ENTM:  Nose:  no rhinorrhea.  Throat:  no erythema or exudate, mucous membranes moist.  NECK:  No JVD.  Supple    RESPIRATORY: Bibasilar rales with good air movement.    CARDIOVASCULAR:  Regular rate and rhythm.  No murmurs, rubs, or gallops.  GI:  Normal bowel sounds, abdomen soft and non-tender.  No rebound or guarding.  BACK:  Non-tender.  UPPER EXT:  Normal inspection.  LOWER EXT:  No edema, no calf tenderness.  Distal pulses intact.  NEURO:  Moves all four extremities, and grossly normal motor exam.  SKIN:  No rashes;  Normal for age.  PSYCH:  Alert and normal affect.    DIFFERENTIAL DIAGNOSES/ MEDICAL DECISION MAKING:  Different diagnoses: shortness of breath in hemodialysis patient includes Congestive heart failure, noncompliance with fluid restriction diet, pneumonia, copd, bronchitis, pulmonary embolism, cardiac event to include ami/acs, combination of the above, or other etiologies.    I believe that the patient's underlying shortness of breath is most likely from congestive heart failure, with this patient being noncompliant with a fluid restriction diet for hemodialysis patient.    In this patient, SOB due to acute decompensation of patient's underlying CHF is higher on the initial differential diagnosis.  This would also include possible patient noncompliance with medications for CHF and/or noncompliance with fluid restriction diet in ESRD hemodialysis patient.     Diagnostic Study Results     Abnormal lab results from  this emergency department encounter:  Labs Reviewed   CBC WITH AUTOMATED DIFF - Abnormal; Notable for the following components:       Result Value    RDW 15.1 (*)  NEUTROPHILS 85 (*)     LYMPHOCYTES 12 (*)     ABS. NEUTROPHILS 8.8 (*)     All other components within normal limits   POC CHEM8 - Abnormal; Notable for the following components:    BUN, POC 69 (*)     Creatinine, POC 16.0 (*)     GFRAA, POC 3 (*)     GFRNA, POC 3 (*)     Sodium, POC 134 (*)     Potassium, POC 7.4 (*)     Calcium, ionized (POC) 1.07 (*)     All other components within normal limits   METABOLIC PANEL, BASIC   MAGNESIUM   TROPONIN I   HCG QL SERUM       Lab values for this patient within approximately the last 12 hours:  Recent Results (from the past 12 hour(s))   EKG, 12 LEAD, INITIAL    Collection Time: 01/10/18  4:08 PM   Result Value Ref Range    Ventricular Rate 82 BPM    Atrial Rate 82 BPM    P-R Interval 178 ms    QRS Duration 92 ms    Q-T Interval 406 ms    QTC Calculation (Bezet) 474 ms    Calculated P Axis 63 degrees    Calculated R Axis -25 degrees    Calculated T Axis 96 degrees    Diagnosis       Normal sinus rhythm  Possible Left atrial enlargement  T wave abnormality, consider lateral ischemia  Prolonged QT  Abnormal ECG  When compared with ECG of 17-Mar-2016 14:28,  T wave inversion less evident in Lateral leads  QT has shortened     CBC WITH AUTOMATED DIFF    Collection Time: 01/10/18  4:45 PM   Result Value Ref Range    WBC 10.4 4.6 - 13.2 K/uL    RBC 4.20 4.20 - 5.30 M/uL    HGB 12.3 12.0 - 16.0 g/dL    HCT 16.1 09.6 - 04.5 %    MCV 86.4 74.0 - 97.0 FL    MCH 29.3 24.0 - 34.0 PG    MCHC 33.9 31.0 - 37.0 g/dL    RDW 40.9 (H) 81.1 - 14.5 %    PLATELET 273 135 - 420 K/uL    MPV 11.5 9.2 - 11.8 FL    NEUTROPHILS 85 (H) 40 - 73 %    LYMPHOCYTES 12 (L) 21 - 52 %    MONOCYTES 3 3 - 10 %    EOSINOPHILS 0 0 - 5 %    BASOPHILS 0 0 - 2 %    ABS. NEUTROPHILS 8.8 (H) 1.8 - 8.0 K/UL    ABS. LYMPHOCYTES 1.2 0.9 - 3.6 K/UL    ABS.  MONOCYTES 0.3 0.05 - 1.2 K/UL    ABS. EOSINOPHILS 0.0 0.0 - 0.4 K/UL    ABS. BASOPHILS 0.0 0.0 - 0.1 K/UL    DF AUTOMATED     POC CHEM8    Collection Time: 01/10/18  4:47 PM   Result Value Ref Range    CO2, POC 23 19 - 24 MMOL/L    Glucose, POC 84 74 - 106 MG/DL    BUN, POC 69 (H) 7 - 18 MG/DL    Creatinine, POC 91.4 (H) 0.6 - 1.3 MG/DL    GFRAA, POC 3 (L) >78 ml/min/1.15m2    GFRNA, POC 3 (L) >60 ml/min/1.45m2    Sodium, POC 134 (L) 136 - 145 MMOL/L  Potassium, POC 7.4 (HH) 3.5 - 5.5 MMOL/L    Calcium, ionized (POC) 1.07 (L) 1.12 - 1.32 mmol/L    Chloride, POC 105 100 - 108 MMOL/L    Anion gap, POC 15 10 - 20      Hematocrit, POC 39 36 - 49 %    Hemoglobin, POC 13.3 12 - 16 G/DL       Radiologist and cardiologist interpretations if available at time of this note:  No results found.     Emergency physician interpretation of EKG: Normal sinus rhythm about 80 bpm with peaked T waves noted.    Medication(s) ordered for patient during this emergency visit encounter:  Medications   calcium gluconate injection 1 g (has no administration in time range)   sodium bicarbonate (8.4%) injection 50 mEq (has no administration in time range)   insulin regular (NOVOLIN R, HUMULIN R) injection 10 Units (has no administration in time range)   sodium polystyrene (KAYEXALATE) 15 gram/60 mL oral suspension 30 g (has no administration in time range)   albuterol (PROVENTIL VENTOLIN) nebulizer solution 2.5 mg (has no administration in time range)       Medical Decision Making     I am the first provider for this patient.    I reviewed the vital signs, available nursing notes, past medical history, past surgical history, family history and social history.    Vital Signs:  Reviewed the patient's vital signs.       4:50 PM  Point-of-care Chem-8 shows a potassium of 7.4.  Medications given for hyperkalemia including calcium.    IMPRESSION AND MEDICAL DECISION MAKING:  Based upon the patient's presentation with noted HPI and PE, along with  the work up done in the emergency department, I believe that the patient is having acute pulmonary edema in an end stage renal disease hemodialysis patient.    I do believe that the patient needs to be admitted for further diuresis and observation, as well as hemodialysis for dialysis to remove fluid, and hopefully improve the patient???s respiratory status.     Consult Nephrology    The on call nephrologist was called and the patient was presented for nephrology consult. I personally spoke with Dr. Mercer Pod, in the nephrology group, about the patient's presentation and management.     I subsequently placed the noted nephrologist on the treatment team.    Critical Care Note:  Hyperkalemia    Critical care minutes: 76 MINUTES.     The patient's severe hyperkalemia, patient required immediate polypharmacy intervention and frequent reevaluations.    Given the patients underlying condition medical intervention(s) were needed, requiring numerous reevaluations of patient's vital signs and response to different emergency department therapies, total bedside time evaluating and/or treating the patient, not including procedures, is noted below.     Signout Note      Pt care transferred to Dr. Ladona Ridgel  ,ED provider. History of patient complaint(s), available diagnostic reports and current treatment plan has been discussed thoroughly.   Bedside rounding on patient occured : no .  Intended disposition of patient : Discharge.  Pending diagnostics reports and/or labs (please list): Reevaluation of the patient once he returns from dialysis and then subsequent discharge.      Coding Diagnoses     Clinical Impression:   1. Acute pulmonary edema (HCC)    2. Poorly-controlled hypertension    3. History of hemodialysis    4. Acute hyperkalemia        Disposition  Disposition: Discharge.    Arlys John L. Westley Foots, M.D.  [employer] Board Certified Emergency Physician    Provider Attestation:  If a scribe was utilized in generation of this patient  record, I personally performed the services described in the documentation, reviewed the documentation, as recorded by the scribe in my presence, and it accurately records the patient's history of presenting illness, review of systems, patient physical examination, and procedures performed by me as the attending physician.     Arlys John L. Westley Foots, M.D.  [employer] Board Certified Emergency Physician  01/10/2018.  4:48 PM

## 2018-01-10 NOTE — ED Notes (Signed)
Provider aware of patient vital signs. No new orders at this time.

## 2018-01-10 NOTE — ED Notes (Signed)
Blood sugar rechecked, 60. Patient given juice and crackers will recheck in 15min

## 2018-01-10 NOTE — ED Provider Notes (Addendum)
Sondra Barges  [facility] EMERGENCY DEPT      4:40 PM    Date: 01/10/2018  Patient Name: Bethany Keller    History of Presenting Illness     Chief Complaint   Patient presents with   ??? Shortness of Breath       37 y.o. female with noted past medical history who presents to the emergency department with shortness of breath this morning.  Patient states she was in her usual state of health and went to dialysis on Friday but because there was a "machine issue" she only had a partial run of 2 out of 4 hours.    She states that this morning she started to have some shortness of breath this persisted to the present.  Initially she had a minimal amount of chest discomfort this morning that is since resolved.  She states that shortness of breath and dyspnea exertion continue the present.  Her next hemodialysis run is scheduled for tomorrow 5:45 PM.    She has no URI symptoms.  She has no fever or chills.  She has no recent long trips as well.  She states this feels like prior times when she is been fluid overloaded.  To that end, she does admit to drinking more fluid over the last few days but has drank more because she said she vomited couple times.    Patient denies any other associated signs or symptoms.  Patient denies any other complaints.    Nursing notes regarding the HPI and triage nursing notes were reviewed.     Prior medical records were reviewed.     Current Facility-Administered Medications   Medication Dose Route Frequency Provider Last Rate Last Dose   ??? calcium gluconate injection 1 g  1 g IntraVENous Dema Severin, MD       ??? sodium bicarbonate (8.4%) injection 50 mEq  50 mEq IntraVENous NOW Suzanna Obey, MD       ??? insulin regular (NOVOLIN R, HUMULIN R) injection 10 Units  10 Units IntraVENous NOW Suzanna Obey, MD       ??? sodium polystyrene (KAYEXALATE) 15 gram/60 mL oral suspension 30 g  30 g Oral NOW Suzanna Obey, MD       ??? albuterol (PROVENTIL VENTOLIN) nebulizer solution 2.5 mg  2.5 mg  Nebulization NOW Suzanna Obey, MD         Current Outpatient Medications   Medication Sig Dispense Refill   ??? furosemide (LASIX) 80 mg tablet Take 40 mg by mouth every Tuesday Thursday, Saturday.     ??? spironolactone (ALDACTONE) 50 mg tablet Take 50 mg by mouth daily.     ??? aspirin/acetaminophen/caffeine (EXCEDRIN EXTRA STRENGTH PO) Take 2 Tabs by mouth.     ??? amLODIPine (NORVASC) 10 mg tablet Take 1 Tab by mouth daily. 90 Tab 3   ??? carvedilol (COREG) 25 mg tablet Take 1 Tab by mouth two (2) times daily (with meals). 180 Tab 3   ??? cloNIDine HCl (CATAPRES) 0.2 mg tablet Take 1 Tab by mouth two (2) times a day. 180 Tab 1       Past History     Past Medical History:  Past Medical History:   Diagnosis Date   ??? Acute parametritis and pelvic cellulitis 10/09/2011   ??? Chronic kidney disease    ??? Hypertension 10/09/2011       Past Surgical History:  Past Surgical History:   Procedure Laterality Date   ??? HX HEENT  2007  LASIK   ??? HX OTHER SURGICAL  2014    liposuction   ??? HX VASCULAR ACCESS      LUE fistula   ??? HX VASCULAR ACCESS  09/2016    tunneled dialysis catheter       Family History:  History reviewed. No pertinent family history.    Social History:  Social History     Tobacco Use   ??? Smoking status: Never Smoker   ??? Smokeless tobacco: Never Used   Substance Use Topics   ??? Alcohol use: Yes     Comment: socially   ??? Drug use: No       Allergies:  No Known Allergies    Patient's primary care provider (as noted in EPIC):  Deshmukh, Concepcion Elk, MD    Review of Systems   Constitutional: Negative for diaphoresis.   HENT: Negative for congestion.    Eyes: Negative for discharge.   Respiratory: Positive for shortness of breath. Negative for stridor.    Cardiovascular: Negative for palpitations.   Gastrointestinal: Negative for diarrhea.   Endocrine: Negative for heat intolerance.   Genitourinary: Negative for flank pain.   Musculoskeletal: Negative for back pain.   Neurological: Negative for weakness.    Psychiatric/Behavioral: Negative for hallucinations.   All other systems reviewed and are negative.      Visit Vitals  BP (!) 215/138 (BP 1 Location: Right arm, BP Patient Position: At rest)   Pulse 83   Temp 98.9 ??F (37.2 ??C)   Resp 22   Wt 68 kg (150 lb)   SpO2 (!) 88%   BMI 24.96 kg/m??       PHYSICAL EXAM:    CONSTITUTIONAL:  Alert, in no apparent distress;  well developed;  well nourished.  HEAD:  Normocephalic, atraumatic.  EYES:  EOMI.  Non-icteric sclera.  Normal conjunctiva.  ENTM:  Nose:  no rhinorrhea.  Throat:  no erythema or exudate, mucous membranes moist.  NECK:  No JVD.  Supple    RESPIRATORY: Bibasilar rales with good air movement.    CARDIOVASCULAR:  Regular rate and rhythm.  No murmurs, rubs, or gallops.  GI:  Normal bowel sounds, abdomen soft and non-tender.  No rebound or guarding.  BACK:  Non-tender.  UPPER EXT:  Normal inspection.  LOWER EXT:  No edema, no calf tenderness.  Distal pulses intact.  NEURO:  Moves all four extremities, and grossly normal motor exam.  SKIN:  No rashes;  Normal for age.  PSYCH:  Alert and normal affect.    DIFFERENTIAL DIAGNOSES/ MEDICAL DECISION MAKING:  Different diagnoses: shortness of breath in hemodialysis patient includes Congestive heart failure, noncompliance with fluid restriction diet, pneumonia, copd, bronchitis, pulmonary embolism, cardiac event to include ami/acs, combination of the above, or other etiologies.    I believe that the patient's underlying shortness of breath is most likely from congestive heart failure, with this patient being noncompliant with a fluid restriction diet for hemodialysis patient.    In this patient, SOB due to acute decompensation of patient's underlying CHF is higher on the initial differential diagnosis.  This would also include possible patient noncompliance with medications for CHF and/or noncompliance with fluid restriction diet in ESRD hemodialysis patient.     Diagnostic Study Results      Abnormal lab results from this emergency department encounter:  Labs Reviewed   CBC WITH AUTOMATED DIFF - Abnormal; Notable for the following components:       Result Value    RDW 15.1 (*)  NEUTROPHILS 85 (*)     LYMPHOCYTES 12 (*)     ABS. NEUTROPHILS 8.8 (*)     All other components within normal limits   POC CHEM8 - Abnormal; Notable for the following components:    BUN, POC 69 (*)     Creatinine, POC 16.0 (*)     GFRAA, POC 3 (*)     GFRNA, POC 3 (*)     Sodium, POC 134 (*)     Potassium, POC 7.4 (*)     Calcium, ionized (POC) 1.07 (*)     All other components within normal limits   METABOLIC PANEL, BASIC   MAGNESIUM   TROPONIN I   HCG QL SERUM       Lab values for this patient within approximately the last 12 hours:  Recent Results (from the past 12 hour(s))   EKG, 12 LEAD, INITIAL    Collection Time: 01/10/18  4:08 PM   Result Value Ref Range    Ventricular Rate 82 BPM    Atrial Rate 82 BPM    P-R Interval 178 ms    QRS Duration 92 ms    Q-T Interval 406 ms    QTC Calculation (Bezet) 474 ms    Calculated P Axis 63 degrees    Calculated R Axis -25 degrees    Calculated T Axis 96 degrees    Diagnosis       Normal sinus rhythm  Possible Left atrial enlargement  T wave abnormality, consider lateral ischemia  Prolonged QT  Abnormal ECG  When compared with ECG of 17-Mar-2016 14:28,  T wave inversion less evident in Lateral leads  QT has shortened     CBC WITH AUTOMATED DIFF    Collection Time: 01/10/18  4:45 PM   Result Value Ref Range    WBC 10.4 4.6 - 13.2 K/uL    RBC 4.20 4.20 - 5.30 M/uL    HGB 12.3 12.0 - 16.0 g/dL    HCT 81.1 91.4 - 78.2 %    MCV 86.4 74.0 - 97.0 FL    MCH 29.3 24.0 - 34.0 PG    MCHC 33.9 31.0 - 37.0 g/dL    RDW 95.6 (H) 21.3 - 14.5 %    PLATELET 273 135 - 420 K/uL    MPV 11.5 9.2 - 11.8 FL    NEUTROPHILS 85 (H) 40 - 73 %    LYMPHOCYTES 12 (L) 21 - 52 %    MONOCYTES 3 3 - 10 %    EOSINOPHILS 0 0 - 5 %    BASOPHILS 0 0 - 2 %    ABS. NEUTROPHILS 8.8 (H) 1.8 - 8.0 K/UL     ABS. LYMPHOCYTES 1.2 0.9 - 3.6 K/UL    ABS. MONOCYTES 0.3 0.05 - 1.2 K/UL    ABS. EOSINOPHILS 0.0 0.0 - 0.4 K/UL    ABS. BASOPHILS 0.0 0.0 - 0.1 K/UL    DF AUTOMATED     POC CHEM8    Collection Time: 01/10/18  4:47 PM   Result Value Ref Range    CO2, POC 23 19 - 24 MMOL/L    Glucose, POC 84 74 - 106 MG/DL    BUN, POC 69 (H) 7 - 18 MG/DL    Creatinine, POC 08.6 (H) 0.6 - 1.3 MG/DL    GFRAA, POC 3 (L) >57 ml/min/1.82m2    GFRNA, POC 3 (L) >60 ml/min/1.72m2    Sodium, POC 134 (L) 136 - 145 MMOL/L  Potassium, POC 7.4 (HH) 3.5 - 5.5 MMOL/L    Calcium, ionized (POC) 1.07 (L) 1.12 - 1.32 mmol/L    Chloride, POC 105 100 - 108 MMOL/L    Anion gap, POC 15 10 - 20      Hematocrit, POC 39 36 - 49 %    Hemoglobin, POC 13.3 12 - 16 G/DL       Radiologist and cardiologist interpretations if available at time of this note:  No results found.     Emergency physician interpretation of EKG: Normal sinus rhythm about 80 bpm with peaked T waves noted.    Medication(s) ordered for patient during this emergency visit encounter:  Medications   calcium gluconate injection 1 g (has no administration in time range)   sodium bicarbonate (8.4%) injection 50 mEq (has no administration in time range)   insulin regular (NOVOLIN R, HUMULIN R) injection 10 Units (has no administration in time range)   sodium polystyrene (KAYEXALATE) 15 gram/60 mL oral suspension 30 g (has no administration in time range)   albuterol (PROVENTIL VENTOLIN) nebulizer solution 2.5 mg (has no administration in time range)       Medical Decision Making     I am the first provider for this patient.    I reviewed the vital signs, available nursing notes, past medical history, past surgical history, family history and social history.    Vital Signs:  Reviewed the patient's vital signs.       4:50 PM  Point-of-care Chem-8 shows a potassium of 7.4.  Medications given for hyperkalemia including calcium.    IMPRESSION AND MEDICAL DECISION MAKING:   Based upon the patient's presentation with noted HPI and PE, along with the work up done in the emergency department, I believe that the patient is having acute pulmonary edema in an end stage renal disease hemodialysis patient.    I do believe that the patient needs to be admitted for further diuresis and observation, as well as hemodialysis for dialysis to remove fluid, and hopefully improve the patient???s respiratory status.     Consult Nephrology    The on call nephrologist was called and the patient was presented for nephrology consult. I personally spoke with Dr. Mercer Pod, in the nephrology group, about the patient's presentation and management.     I subsequently placed the noted nephrologist on the treatment team.    Critical Care Note:  Hyperkalemia    Critical care minutes: 76 MINUTES.     The patient's severe hyperkalemia, patient required immediate polypharmacy intervention and frequent reevaluations.    Given the patients underlying condition medical intervention(s) were needed, requiring numerous reevaluations of patient's vital signs and response to different emergency department therapies, total bedside time evaluating and/or treating the patient, not including procedures, is noted below.     Signout Note      Pt care transferred to Dr. Ladona Ridgel  ,ED provider. History of patient complaint(s), available diagnostic reports and current treatment plan has been discussed thoroughly.   Bedside rounding on patient occured : no .  Intended disposition of patient : Discharge.  Pending diagnostics reports and/or labs (please list): Reevaluation of the patient once he returns from dialysis and then subsequent discharge.      Coding Diagnoses     Clinical Impression:   1. Acute pulmonary edema (HCC)    2. Poorly-controlled hypertension    3. History of hemodialysis    4. Acute hyperkalemia        Disposition  Disposition: Discharge.    Arlys John L. Westley Foots, M.D.  ABEM Board Certified Emergency Physician     Provider Attestation:  If a scribe was utilized in generation of this patient record, I personally performed the services described in the documentation, reviewed the documentation, as recorded by the scribe in my presence, and it accurately records the patient's history of presenting illness, review of systems, patient physical examination, and procedures performed by me as the attending physician.     Arlys John L. Westley Foots, M.D.  ABEM Board Certified Emergency Physician  01/10/2018.  4:48 PM

## 2018-01-10 NOTE — ED Triage Notes (Signed)
Pt presents for SOB at rest and with acitivity x 1 day. Pt denies cough, swelling. Had dialysis Friday, but did not finish bc of "machine issue." No recent travels, surgeries, no birth control

## 2018-01-10 NOTE — ED Notes (Signed)
Patient stated she was feeling hot  . Patient appears diaphoretic. Will check blood sugar.

## 2018-01-10 NOTE — ED Notes (Signed)
Patient unhooked from monitor and ambulated to restroom with no issue.

## 2018-01-10 NOTE — ED Notes (Signed)
Attempted to obtain blood and Iv access, unsuccessful

## 2018-01-10 NOTE — ED Notes (Signed)
Patient placed on 2L nasal canula. Oxygen saturation 85% on room air.

## 2018-01-10 NOTE — Other (Signed)
[facility]        ACUTE HEMODIALYSIS FLOW SHEET      HEMODIALYSIS ORDERS: Physician:   Bichu      Dialyzer: revaclear   Duration:  3   hr  BFR: 350      DFR:  800     Dialysate:  Temp 36-37 C  K+ K1 for 1.5hr K2 for 1.5hr         Ca+2.5     Na 138   Bicarb  35     Weight:      kg    Patient Chart []      Unable to Obtain [x]    Dry weight/UF Goal:2500    Access   Needle Gauge  16G   Heparin []   Bolus  Units    []  Hourly    Units    [x]   None      Catheter locking solution       Pre BP: 209/129        Pulse: 90              Respirations:18     Temperature:  97.6    Labs: Pre     Post:      [x]  N/A   Additional Orders(medications, blood products, hypotension management):  []  N/A     [x]     [facility] Consent Verified        CATHETER ACCESS: [x] N/A   [] Right   [] Left   [] IJ     [] Fem   [] chest wall   []  First use X-ray verified     [] Tunnel                []  Non Tunneled   [] No S/S infection  [] Redness  [] Drainage [] Cultured [] Swelling [] Pain   [] Medical Aseptic Prep Utilized   [] Dressing Changed  []  Biopatch  Date:    [] Clotted   [] Patent   Flows: [] Good  [] Poor  [] Reversed   If access problem, Dr. notified: [] Yes    [x] N/A  Date:     GRAFT/FISTULA ACCESS:  [] N/A     [] Right     [x] Left     [x] UE     [] LE   [] AVG   [x] AVF        [] Buttonhole    [x] Medical Aseptic Prep Utilized   [x] No S/S infection  [] Redness  [] Drainage [] Cultured [] Swelling [] Pain    Bruit:   [x]  Strong    []  Weak       Thrill :   [x]  Strong    []  Weak       Needle Gauge:16    g    Length:1    inch   If access problem, Dr. notified: [] Yes     [x] N/A  Date:   Please describe access if present and not used:                            GENERAL ASSESSMENT:      LUNGS:  Rate  SaO2%     []  N/A    [x]  Clear  []  Coarse  []  Crackles  []  Wheezing        []  Diminished     Location : [] RLL   [] LLL    [] RUL  [] LUL     Cough: [] Productive  [] Dry  [x] N/A   Respirations:  [x] Easy  [] Labored     Therapy:   [x] RA  [] NC    l/min  Mask: [] NRB [] Venti       O2%                   [] Ventilator  [] Intubated  []  Trach  []  BiPaP     CARDIAC: [x] Regular      []  Irregular   []  Pericardial Rub  []  JVD        []   Monitored  []  Bedside  []  Remotely monitored []  N/A  Rhythm:      EDEMA: [x]  None  [] Generalized  []  Pitting []  1    []  2    []  3    []  4                 []  Facial  []  Pedal  []   UE  []  LE     SKIN:   [x]  Warm  []  Hot     []  Cold   [x]  Dry     []  Pale   []  Diaphoretic                  []  Flushed  []  Jaundiced  []  Cyanotic  []  Rash  []  Weeping     LOC:    [x]  Alert      [x] Oriented:    [x]  Person     [x]  Place  [x] Time               []  Confused  []  Lethargic  []  Medicated  []  Non-responsive     GI / ABDOMEN:     []  Flat    []  Distended    [x]  Soft    []  Firm   []   Obese                             []  Diarrhea  [x]  Bowel Sounds  []  Nausea  []  Vomiting      GU / URINE ASSESSMENT:   []  Voiding   [x]  Oliguria  []  Anuria   []   Foley     []  Incontinent    []   Incontinent Brief      []   Bathroom Privileges       PAIN:   [x]  0 [] 1  [] 2   [] 3   [] 4   [] 5   [] 6   [] 7   [] 8   [] 9   [] 10              Scale 0-10  Action/Follow Up:      MOBILITY:     []  Amb    []  Amb/Assist    [x]  Bed    []  Wheelchair  []  Stretcher      All Vitals and Treatment Details on Attached [facility]:  Depaul       Room # ER02/02      []  1st Time Acute  [x]  Stat  []  Routine  []  Urgent     [x]  Acute Room  []   Bedside  []  ICU/CCU  []  ER   Isolation Precautions:  There are currently no Active Isolations      Special Considerations:         []  Blood Consent Verified [x] N/A     ALLERGIES: No Known Allergies            Code Status:Prior        Hepatitis Status:    2nd RN check:  Lab Results   Component Value Date/Time    Hepatitis B surface Ag <0.10 07/11/2015 04:52 AM    Hepatitis B surface Ab <3.10 (L) 07/11/2015 04:52 AM    Hepatitis C virus Ab 0.08 07/11/2015 04:52 AM                     Current Labs:   Lab Results   Component Value Date/Time    Sodium 133 (L) 01/10/2018 04:45 PM     Potassium 7.3 (HH) 01/10/2018 04:45 PM    Chloride 99 (L) 01/10/2018 04:45 PM    CO2 25 01/10/2018 04:45 PM    Anion gap 9 01/10/2018 04:45 PM    Glucose 84 01/10/2018 04:45 PM    BUN 72 (H) 01/10/2018 04:45 PM    Creatinine 15.00 (H) 01/10/2018 04:45 PM    BUN/Creatinine ratio 5 (L) 01/10/2018 04:45 PM    GFR est AA 3 (L) 01/10/2018 04:45 PM    GFR est non-AA 3 (L) 01/10/2018 04:45 PM    Calcium 9.4 01/10/2018 04:45 PM      Lab Results   Component Value Date/Time    WBC 10.4 01/10/2018 04:45 PM    Hemoglobin, POC 13.3 01/10/2018 04:47 PM    HGB 12.3 01/10/2018 04:45 PM    Hematocrit, POC 39 01/10/2018 04:47 PM    HCT 36.3 01/10/2018 04:45 PM    PLATELET 273 01/10/2018 04:45 PM    MCV 86.4 01/10/2018 04:45 PM                                                                                     DIET: None       PRIMARY NURSE REPORT: First initial/Last name/Title      Pre Dialysis:        Time:          EDUCATION:       [x]  Patient []  Other         Knowledge Basis: [] None [x] Minimal []  Substantial   Barriers to learning     [] N/A   []  Access Care     []  S&S of infection     []  Fluid Management     [x] K+     [x] Procedural    [] Albumin     []  Medications     []  Tx Options     []  Transplant     []  Diet     []  Other   Teaching Tools:  [x]  Explain  []  Demo  []  Handouts []  Video  Patient response:      [x]  Verbalized understanding  []  Teach back  []  Return demonstration []  Requires follow up   Inappropriate due to            [x]     Time Out/Safety Check  [x]    Extracorporeal Circuit Tested for integrity       RO/HEMODIALYSIS MACHINE SAFETY CHECKS ??? Before each treatment:     Machine Number:                   [facility]                                  []   Unit Machine #     with centralized RO                                  []  Portable Machine #1/RO serial # [id number]                                  []  Portable Machine #2/RO serial # [id number]                                   []  Portable Machine #4/RO serial # [id number]                                                     [facility]                                  []  Portable Machine #1/RO serial # [id number]                                   []  Portable Machine #2/RO serial # [id number]                                  [x]  Portable Machine #3/RO serial #  [id number]      Alarm Test:  Pass time                [x] RO/Machine Log Complete      Temp  37              Dialysate: pH7.2  Conductivity: Meter 14      HD Machine  14             TCD: 13.9  Dialyzer Lot #   [id number]             Blood Tubing Lot #  [id number]            Saline Lot #  [id number]        CHLORINE TESTING-Before each treatment and every 4 hours    Total Chlorine:     less than 0.1 ppm  Time: 1900     4 Hr/2nd Check Time:       (if greater than 0.1 ppm from Primary then every 30 minutes from Secondary)     TREATMENT INITIATION ??? with Dialysis Precautions:   [x]  All Connections Secured                 [x]  Saline Line Double Clamped   [x]  Venous Parameters Set                  [x]  Arterial Parameters Set    [x]  Prime Given                          [x] Air Foam Detector Engaged      Treatment Initiation Note: Started treatment using left arm fistula. Pt.  Says they use 16g needles on her.         During Treatment Notes:              Medication Dose Volume Route Time [facility] name Title         Licensed conveyancer                   Post Assessment:   Dialyzer Cleared:    [x]  Good []  Fair  []  Poor  Blood processed:      L  UF Removed   2500   Ml  POst BP:  223/112           Pulse:  90          Respirations:      Temperature:     Lungs:     [x]  Clear      []  Course         []  Crackles    []  Wheezing         []  Diminished   Post Tx Vascular Access:   AVF/AVG: Bleeding stopped   Art 6   min. Ven. 5    Min   N/A    Cardiac:   [x]  Regular   []  Irregular   []  Monitor  []  N/A      Rhythm:       Catheter:    Locking solution: Heparin 1:1000   Art.      Ven.     N/A    Skin:  Pain:    [x]  Warm  [x]  Dry []  Diaphoretic    []  Flushed    []  Pale []  Cyanotic [x] 0  [] 1  [] 2   [] 3  [] 4   [] 5   [] 6   [] 7   [] 8   [] 9   [] 10     Post Treatment Note:Pt. Tolerated treatment well.         POST TREATMENT PRIMARY NURSE HANDOFF REPORT:     First initial/Last name/Title         Post Dialysis: Tom RN    Time: 2300      Abbreviations: AVG-arterial venous graft, AVF-arterial venous fistula, IJ-Internal Jugular, Subcl-Subclavian, Fem-Femoral, Tx-treatment, AP/HR-apical heart rate, DFR-dialysate flow rate, BFR-blood flow rate, AP-arterial pressure, VP-venous pressure, UF-ultrafiltrate, TMP-transmembrane pressure, Ven-Venous, Art-Arterial, RO-Reverse Osmosis

## 2018-01-10 NOTE — ED Notes (Signed)
Patient states she feels much better after dialysis.

## 2018-01-10 NOTE — ED Notes (Signed)
Patient updated on plan of care. Patient expressed understanding. Will continue to monitor. Call bell in reach.

## 2018-01-10 NOTE — ED Notes (Signed)
I have reviewed discharge instructions with the patient.  The patient verbalized understanding.

## 2018-01-10 NOTE — ED Notes (Signed)
Patient blood sugar 57. MD aware. Given juice. Will recheck in 15minutes

## 2018-01-11 LAB — EKG 12-LEAD
Atrial Rate: 82 {beats}/min
Diagnosis: NORMAL
P Axis: 63 degrees
P-R Interval: 178 ms
Q-T Interval: 406 ms
QRS Duration: 92 ms
QTc Calculation (Bazett): 474 ms
R Axis: -25 degrees
T Axis: 96 degrees
Ventricular Rate: 82 {beats}/min

## 2018-01-11 LAB — HEPATITIS B SURFACE ANTIGEN: Hepatitis B Surface Ag: <0.1 Index (ref <1.00)

## 2018-01-11 LAB — HEPATITIS B SURFACE ANTIBODY
Hep B S Ab Interp: NEGATIVE — AB
Hep B S Ab: <3.1 m[IU]/mL — ABNORMAL LOW (ref >10.0)

## 2018-01-11 LAB — HEP B SURFACE AB
Hep B surface Ab Interp.: NEGATIVE — AB
Hepatitis B surface Ab: <3.1 m[IU]/mL — ABNORMAL LOW (ref >10.0)

## 2018-01-11 LAB — EKG, 12 LEAD, INITIAL
Atrial Rate: 82 {beats}/min
Calculated P Axis: 63 degrees
Calculated R Axis: -25 degrees
Calculated T Axis: 96 degrees
Diagnosis: NORMAL
P-R Interval: 178 ms
Q-T Interval: 406 ms
QRS Duration: 92 ms
QTC Calculation (Bezet): 474 ms
Ventricular Rate: 82 {beats}/min

## 2018-01-11 LAB — HEP B SURFACE AG
Hep B surface Ag Interp.: NEGATIVE
Hepatitis B surface Ag: <0.1 Index (ref <1.00)

## 2018-08-27 IMAGING — CR DG CHEST 1V PORT
1 series · 1 of 1 positions shown · non-contrast
Comparison: None.

CLINICAL DATA: Hypoxia

EXAM:
PORTABLE CHEST 1 VIEW

[AP]
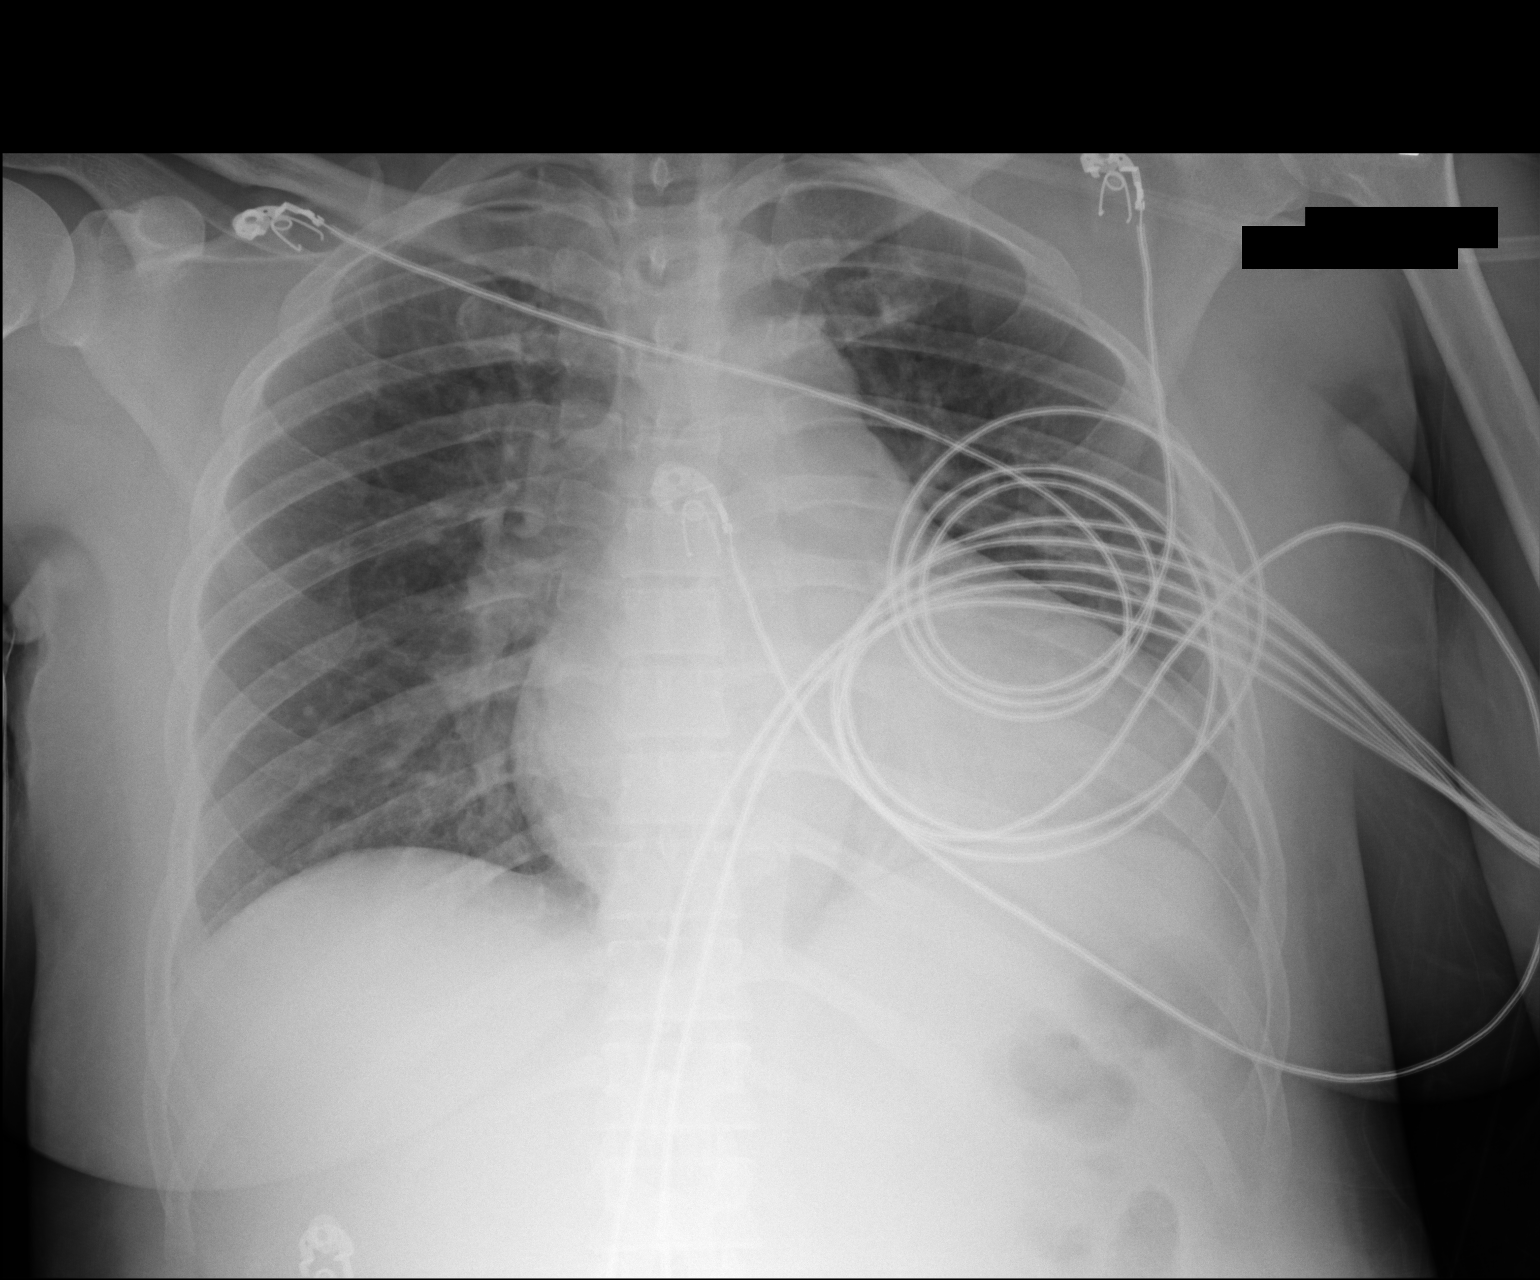

[1 of 1 positions shown; findings below may reference images not displayed]

FINDINGS: Cardiac shadow is mildly enlarged. The lungs are clear bilaterally.
No focal infiltrate is seen. No bony abnormality is noted.
IMPRESSION: No acute abnormality noted.

## 2018-09-10 IMAGING — CR DG ABDOMEN 2V
2 series · 2 of 2 positions shown · non-contrast
Comparison: None.

CLINICAL DATA: Abdominal pain for 1 week, initial encounter

EXAM:
ABDOMEN - 2 VIEW

[abdomen erect]
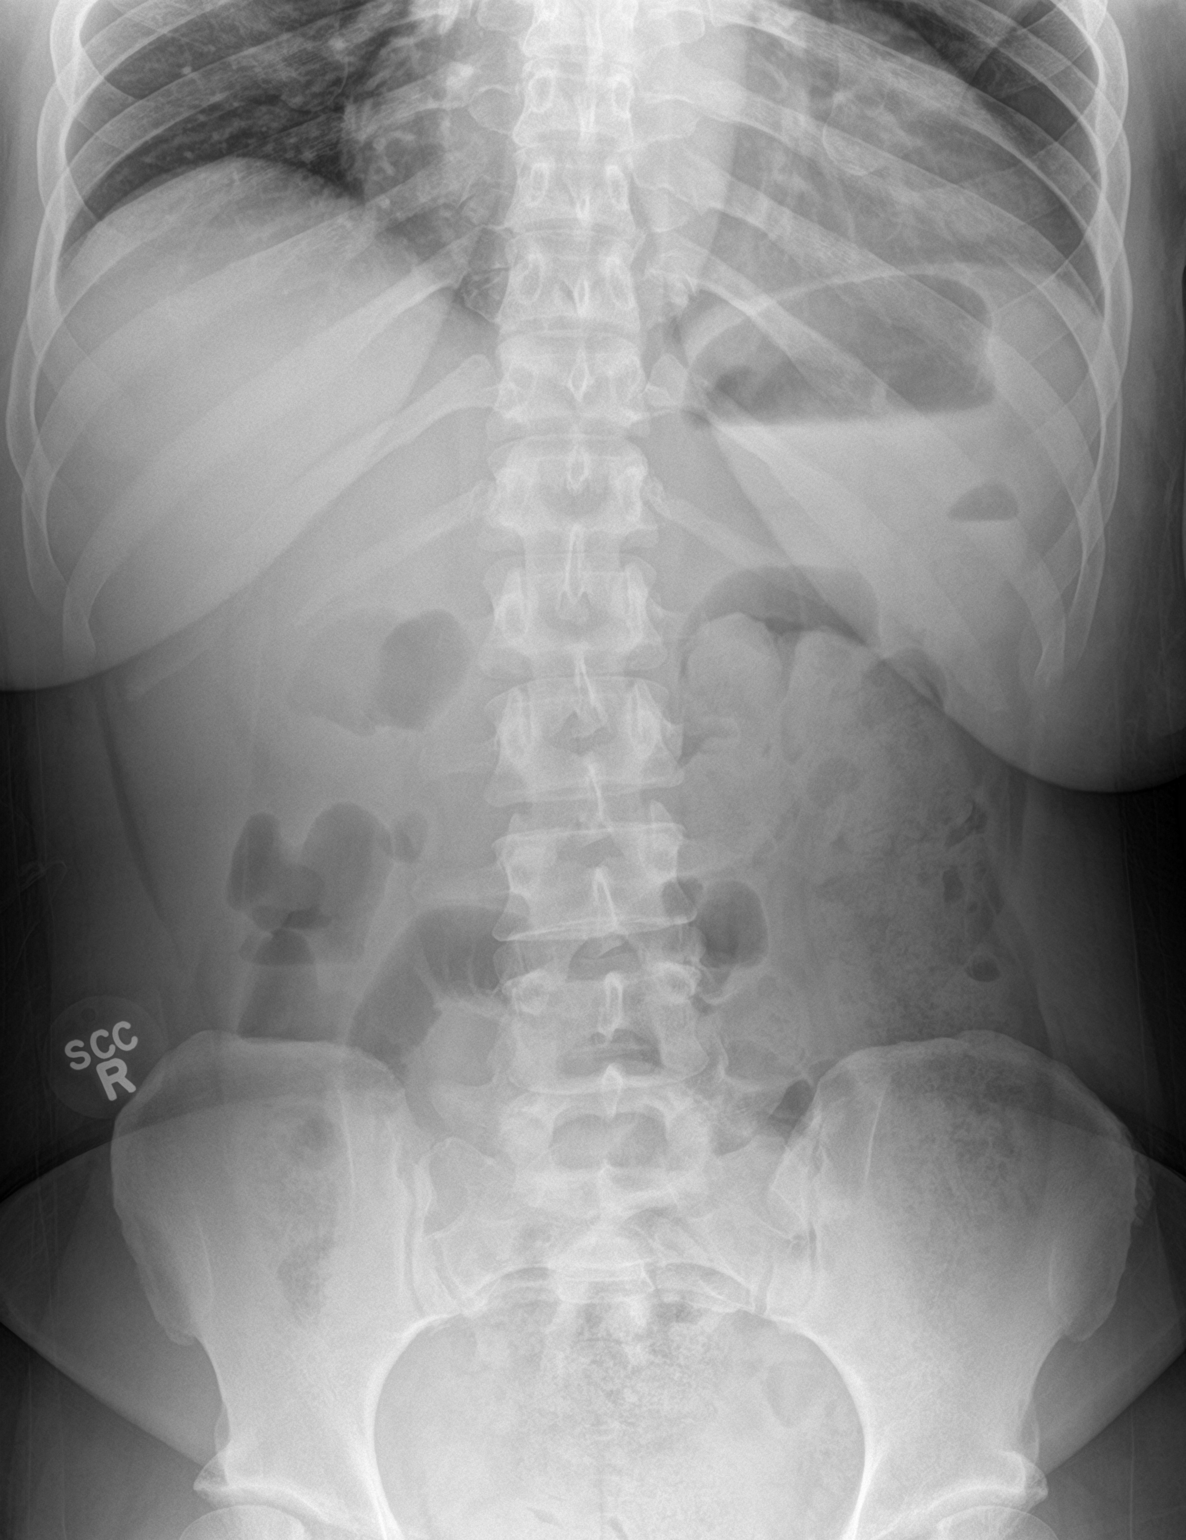

[abdomen supine]
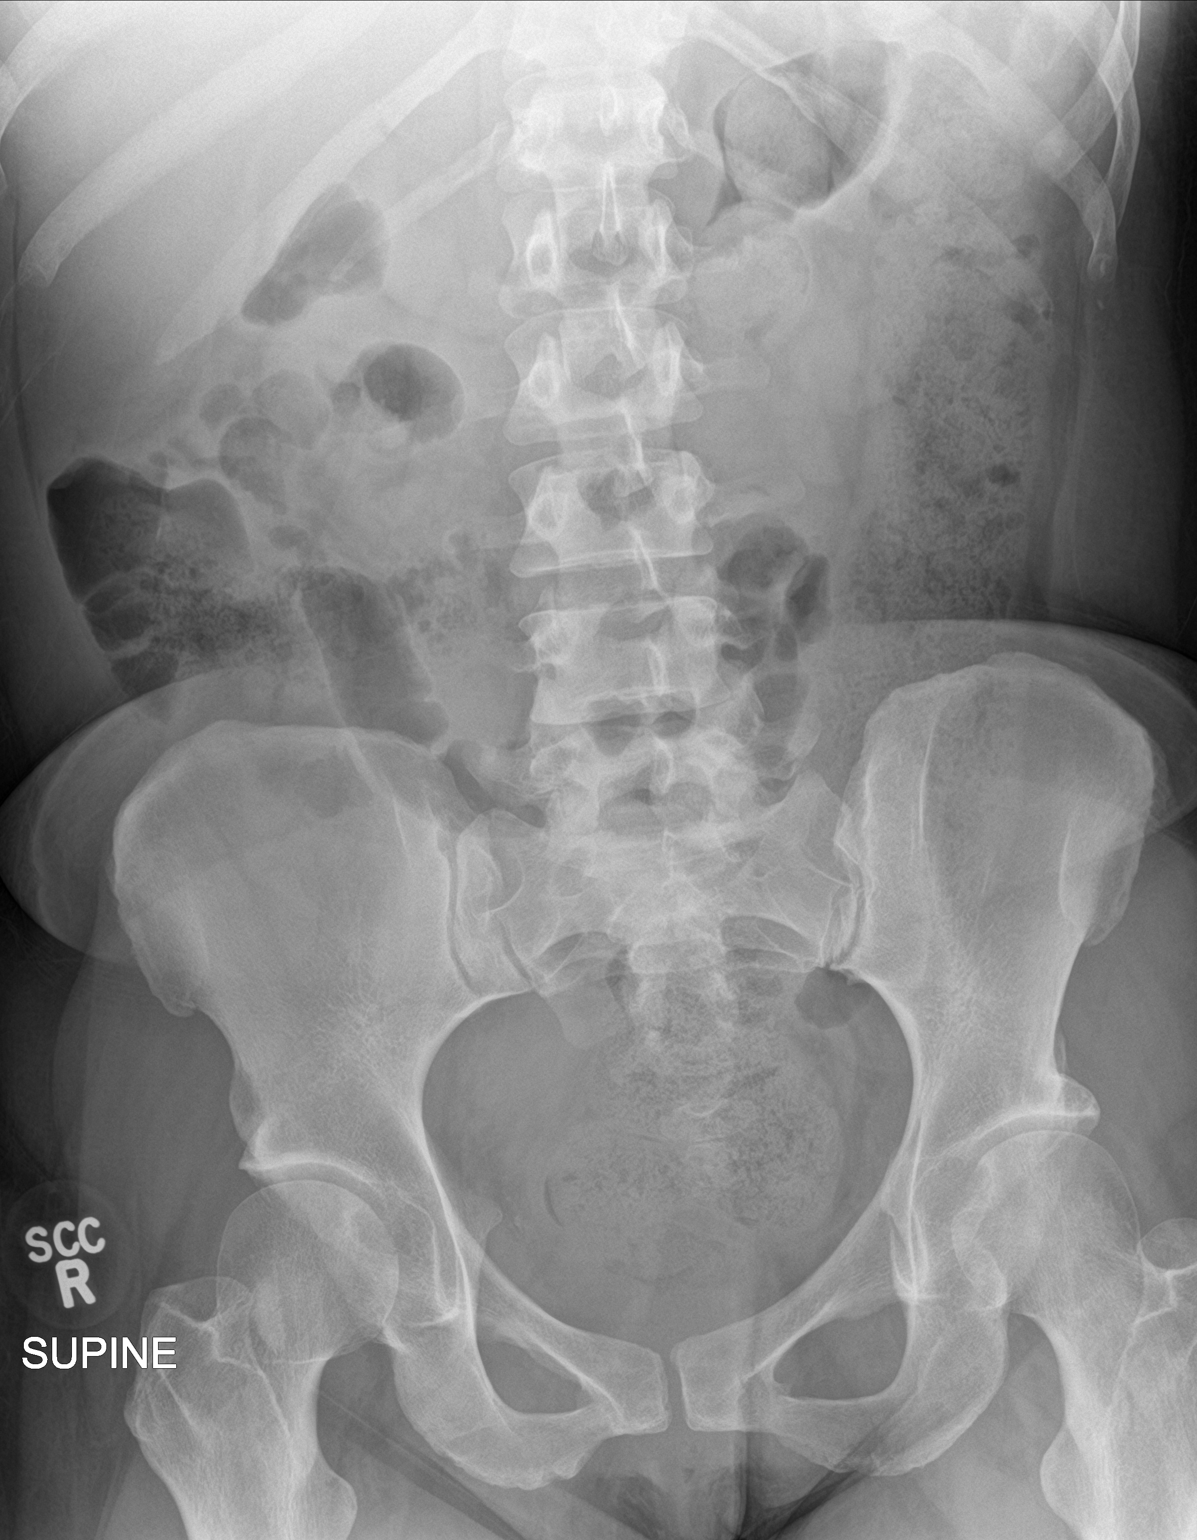

[2 of 2 positions shown; findings below may reference images not displayed]

FINDINGS: Scattered large and small bowel gas is noted. No free air is seen.
Fecal material is noted throughout the left colon consistent with a
degree of constipation. No obstructive changes are seen. No bony
abnormality is noted.
IMPRESSION: Mild constipation.

## 2020-09-13 NOTE — Other (Signed)
 [facility]        ACUTE HEMODIALYSIS FLOW SHEET      HEMODIALYSIS ORDERS: Physician:   Bichu      Dialyzer: revaclear   Duration:  3   hr  BFR: 350      DFR:  800     Dialysate:  Temp 36-37 C  K+ K1 for 1.5hr K2 for 1.5hr         Ca+2.5     Na 138   Bicarb  35     Weight:      kg    Patient Chart []      Unable to Obtain [x]    Dry weight/UF Goal:2500    Access   Needle Gauge  16G   Heparin []   Bolus  Units    []  Hourly    Units    [x]   None      Catheter locking solution       Pre BP: 209/129        Pulse: 90              Respirations:18     Temperature:  97.6    Labs: Pre     Post:      [x]  N/A   Additional Orders(medications, blood products, hypotension management):  []  N/A     [x]     [facility] Consent Verified        CATHETER ACCESS: [x] N/A   [] Right   [] Left   [] IJ     [] Fem   [] chest wall   []  First use X-ray verified     [] Tunnel                []  Non Tunneled   [] No S/S infection  [] Redness  [] Drainage [] Cultured [] Swelling [] Pain   [] Medical Aseptic Prep Utilized   [] Dressing Changed  []  Biopatch  Date:    [] Clotted   [] Patent   Flows: [] Good  [] Poor  [] Reversed   If access problem, Dr. notified: [] Yes    [x] N/A  Date:     GRAFT/FISTULA ACCESS:  [] N/A     [] Right     [x] Left     [x] UE     [] LE   [] AVG   [x] AVF        [] Buttonhole    [x] Medical Aseptic Prep Utilized   [x] No S/S infection  [] Redness  [] Drainage [] Cultured [] Swelling [] Pain    Bruit:   [x]  Strong    []  Weak       Thrill :   [x]  Strong    []  Weak       Needle Gauge:16    g    Length:1    inch   If access problem, Dr. notified: [] Yes     [x] N/A  Date:   Please describe access if present and not used:                            GENERAL ASSESSMENT:      LUNGS:  Rate  SaO2%     []  N/A    [x]  Clear  []  Coarse  []  Crackles  []  Wheezing        []  Diminished     Location : [] RLL   [] LLL    [] RUL  [] LUL     Cough: [] Productive  [] Dry  [x] N/A   Respirations:  [x] Easy  [] Labored     Therapy:   [x] RA  [] NC    l/min  Mask: [] NRB [] Venti       O2%                   [] Ventilator  [] Intubated  []  Trach  []  BiPaP     CARDIAC: [x] Regular      []  Irregular   []  Pericardial Rub  []  JVD        []   Monitored  []  Bedside  []  Remotely monitored []  N/A  Rhythm:      EDEMA: [x]  None  [] Generalized  []  Pitting []  1    []  2    []  3    []  4                 []  Facial  []  Pedal  []   UE  []  LE     SKIN:   [x]  Warm  []  Hot     []  Cold   [x]  Dry     []  Pale   []  Diaphoretic                  []  Flushed  []  Jaundiced  []  Cyanotic  []  Rash  []  Weeping     LOC:    [x]  Alert      [x] Oriented:    [x]  Person     [x]  Place  [x] Time               []  Confused  []  Lethargic  []  Medicated  []  Non-responsive     GI / ABDOMEN:     []  Flat    []  Distended    [x]  Soft    []  Firm   []   Obese                             []  Diarrhea  [x]  Bowel Sounds  []  Nausea  []  Vomiting      GU / URINE ASSESSMENT:   []  Voiding   [x]  Oliguria  []  Anuria   []   Foley     []  Incontinent    []   Incontinent Brief      []   Bathroom Privileges       PAIN:   [x]  0 [] 1  [] 2   [] 3   [] 4   [] 5   [] 6   [] 7   [] 8   [] 9   [] 10              Scale 0-10  Action/Follow Up:      MOBILITY:     []  Amb    []  Amb/Assist    [x]  Bed    []  Wheelchair  []  Stretcher      All Vitals and Treatment Details on Attached [facility]:  Depaul       Room # ER02/02      []  1st Time Acute  [x]  Stat  []  Routine  []  Urgent     [x]  Acute Room  []   Bedside  []  ICU/CCU  []  ER   Isolation Precautions:  There are currently no Active Isolations      Special Considerations:         []  Blood Consent Verified [x] N/A     ALLERGIES: No Known Allergies            Code Status:Prior        Hepatitis Status:    2nd RN check:  Lab Results   Component Value Date/Time    Hepatitis B surface Ag <0.10 07/11/2015 04:52 AM    Hepatitis B surface Ab <3.10 (L) 07/11/2015 04:52 AM    Hepatitis C virus Ab 0.08 07/11/2015 04:52 AM                     Current Labs:   Lab Results   Component Value Date/Time    Sodium 133 (L) 01/10/2018 04:45 PM    Potassium 7.3 (HH)  01/10/2018 04:45 PM    Chloride 99 (L) 01/10/2018 04:45 PM    CO2 25 01/10/2018 04:45 PM    Anion gap 9 01/10/2018 04:45 PM    Glucose 84 01/10/2018 04:45 PM    BUN 72 (H) 01/10/2018 04:45 PM    Creatinine 15.00 (H) 01/10/2018 04:45 PM    BUN/Creatinine ratio 5 (L) 01/10/2018 04:45 PM    GFR est AA 3 (L) 01/10/2018 04:45 PM    GFR est non-AA 3 (L) 01/10/2018 04:45 PM    Calcium 9.4 01/10/2018 04:45 PM      Lab Results   Component Value Date/Time    WBC 10.4 01/10/2018 04:45 PM    Hemoglobin, POC 13.3 01/10/2018 04:47 PM    HGB 12.3 01/10/2018 04:45 PM    Hematocrit, POC 39 01/10/2018 04:47 PM    HCT 36.3 01/10/2018 04:45 PM    PLATELET 273 01/10/2018 04:45 PM    MCV 86.4 01/10/2018 04:45 PM                                                                                     DIET: None       PRIMARY NURSE REPORT: First initial/Last name/Title      Pre Dialysis:        Time:          EDUCATION:       [x]  Patient []  Other         Knowledge Basis: [] None [x] Minimal []  Substantial   Barriers to learning     [] N/A   []  Access Care     []  S&S of infection     []  Fluid Management     [x] K+     [x] Procedural    [] Albumin     []  Medications     []  Tx Options     []  Transplant     []  Diet     []  Other   Teaching Tools:  [x]  Explain  []  Demo  []  Handouts []  Video  Patient response:      [x]  Verbalized understanding  []  Teach back  []  Return demonstration []  Requires follow up   Inappropriate due to            [x]     Time Out/Safety Check  [x]    Extracorporeal Circuit Tested for integrity       RO/HEMODIALYSIS MACHINE SAFETY CHECKS - Before each treatment:     Machine Number:                   [facility]                                  []   Unit Machine #     with centralized RO                                  []  Portable Machine #1/RO serial # [id number]                                  []  Portable Machine #2/RO serial # [id number]                                  []  Portable Machine #4/RO serial # [id number]                                                      [facility]                                  []  Portable Machine #1/RO serial # [id number]                                   []  Portable Machine #2/RO serial # [id number]                                  [x]  Portable Machine #3/RO serial #  [id number]      Alarm Test:  Pass time                [x] RO/Machine Log Complete      Temp  37              Dialysate: pH7.2  Conductivity: Meter 14      HD Machine  14             TCD: 13.9  Dialyzer Lot #   [id number]             Blood Tubing Lot #  [id number]            Saline Lot #  [id number]        CHLORINE TESTING-Before each treatment and every 4 hours    Total Chlorine:     less than 0.1 ppm  Time: 1900     4 Hr/2nd Check Time:       (if greater than 0.1 ppm from Primary then every 30 minutes from Secondary)     TREATMENT INITIATION - with Dialysis Precautions:   [x]  All Connections Secured                 [x]  Saline Line Double Clamped   [x]  Venous Parameters Set                  [x]  Arterial Parameters Set    [x]  Prime Given                          [x] Air Foam Detector Engaged      Treatment Initiation Note: Started treatment using left arm fistula. Pt.  Says they use 16g needles on her.         During Treatment Notes:              Medication Dose Volume Route Time [facility] name Title         Licensed conveyancer                   Post Assessment:   Dialyzer Cleared:    [x]  Good []  Fair  []  Poor  Blood processed:      L  UF Removed   2500   Ml  POst BP:  223/112           Pulse:  90          Respirations:      Temperature:     Lungs:     [x]  Clear      []  Course         []  Crackles    []  Wheezing         []  Diminished   Post Tx Vascular Access:   AVF/AVG: Bleeding stopped   Art 6   min. Ven. 5    Min   N/A    Cardiac:   [x]  Regular   []  Irregular   []  Monitor  []  N/A      Rhythm:       Catheter:   Locking solution: Heparin 1:1000   Art.      Ven.     N/A    Skin:  Pain:    [x]  Warm  [x]  Dry []  Diaphoretic     []  Flushed    []  Pale []  Cyanotic [x] 0  [] 1  [] 2   [] 3  [] 4   [] 5   [] 6   [] 7   [] 8   [] 9   [] 10     Post Treatment Note:Pt. Tolerated treatment well.         POST TREATMENT PRIMARY NURSE HANDOFF REPORT:     First initial/Last name/Title         Post Dialysis: Tom RN    Time: 2300      Abbreviations: AVG-arterial venous graft, AVF-arterial venous fistula, IJ-Internal Jugular, Subcl-Subclavian, Fem-Femoral, Tx-treatment, AP/HR-apical heart rate, DFR-dialysate flow rate, BFR-blood flow rate, AP-arterial pressure, VP-venous pressure, UF-ultrafiltrate, TMP-transmembrane pressure, Ven-Venous, Art-Arterial, RO-Reverse Osmosis

## 2022-07-21 ENCOUNTER — Telehealth (HOSPITAL_COMMUNITY): Payer: Self-pay | Admitting: *Deleted

## 2022-07-21 NOTE — Telephone Encounter (Signed)
Received faxed request from [place] [facility] requesting fistulogram due to high pressures and access bleeding during cannulation. Will give to [facility].

## 2022-07-23 ENCOUNTER — Other Ambulatory Visit: Payer: Self-pay

## 2022-07-23 DIAGNOSIS — T829XXA Unspecified complication of cardiac and vascular prosthetic device, implant and graft, initial encounter: Secondary | ICD-10-CM

## 2022-07-23 MED ORDER — SODIUM CHLORIDE 0.9% FLUSH: 3.0000 mL | Freq: Two times a day (BID) | INTRAVENOUS | Status: AC

## 2022-07-23 MED ORDER — SODIUM CHLORIDE 0.9 % IV SOLN: 250.0000 mL | INTRAVENOUS | Status: AC | PRN

## 2022-07-31 ENCOUNTER — Encounter (HOSPITAL_COMMUNITY)
Admission: RE | Disposition: A | Discharge status: Home / Self Care | Payer: Self-pay | Source: Home / Self Care | Attending: Vascular Surgery

## 2022-07-31 ENCOUNTER — Encounter (HOSPITAL_COMMUNITY): Payer: Self-pay | Admitting: Vascular Surgery

## 2022-07-31 ENCOUNTER — Other Ambulatory Visit: Payer: Self-pay

## 2022-07-31 ENCOUNTER — Ambulatory Visit (HOSPITAL_COMMUNITY)
Admission: RE | Admit: 2022-07-31 | Discharge: 2022-07-31 | Disposition: A | Discharge status: Home / Self Care | Payer: Medicare Other | Attending: Vascular Surgery | Admitting: Vascular Surgery

## 2022-07-31 DIAGNOSIS — Y841 Kidney dialysis as the cause of abnormal reaction of the patient, or of later complication, without mention of misadventure at the time of the procedure: Secondary | ICD-10-CM | POA: Insufficient documentation

## 2022-07-31 DIAGNOSIS — T82898A Other specified complication of vascular prosthetic devices, implants and grafts, initial encounter: Secondary | ICD-10-CM

## 2022-07-31 DIAGNOSIS — N186 End stage renal disease: Secondary | ICD-10-CM | POA: Diagnosis not present

## 2022-07-31 DIAGNOSIS — T82858A Stenosis of vascular prosthetic devices, implants and grafts, initial encounter: Secondary | ICD-10-CM | POA: Diagnosis present

## 2022-07-31 DIAGNOSIS — N185 Chronic kidney disease, stage 5: Secondary | ICD-10-CM

## 2022-07-31 DIAGNOSIS — I120 Hypertensive chronic kidney disease with stage 5 chronic kidney disease or end stage renal disease: Secondary | ICD-10-CM | POA: Insufficient documentation

## 2022-07-31 DIAGNOSIS — Z992 Dependence on renal dialysis: Secondary | ICD-10-CM | POA: Insufficient documentation

## 2022-07-31 DIAGNOSIS — T829XXA Unspecified complication of cardiac and vascular prosthetic device, implant and graft, initial encounter: Secondary | ICD-10-CM

## 2022-07-31 HISTORY — PX: PERIPHERAL VASCULAR BALLOON ANGIOPLASTY: CATH118281

## 2022-07-31 HISTORY — PX: A/V FISTULAGRAM: CATH118298

## 2022-07-31 LAB — POCT I-STAT, CHEM 8
BUN: 34 mg/dL — ABNORMAL HIGH (ref 6–20)
Calcium, Ion: 0.98 mmol/L — ABNORMAL LOW (ref 1.15–1.40)
Chloride: 100 mmol/L (ref 98–111)
Creatinine, Ser: 9.1 mg/dL — ABNORMAL HIGH (ref 0.44–1.00)
Glucose, Bld: 102 mg/dL — ABNORMAL HIGH (ref 70–99)
HCT: 24 % — ABNORMAL LOW (ref 36.0–46.0)
Hemoglobin: 8.2 g/dL — ABNORMAL LOW (ref 12.0–15.0)
Potassium: 4.3 mmol/L (ref 3.5–5.1)
Sodium: 140 mmol/L (ref 135–145)
TCO2: 27 mmol/L (ref 22–32)

## 2022-07-31 LAB — HCG, SERUM, QUALITATIVE: Preg, Serum: NEGATIVE

## 2022-07-31 SURGERY — A/V FISTULAGRAM
Anesthesia: LOCAL | Laterality: Left

## 2022-07-31 MED ORDER — MIDAZOLAM HCL 2 MG/2ML IJ SOLN
INTRAMUSCULAR | Status: AC
  Filled 2022-07-31: qty 2

## 2022-07-31 MED ORDER — IODIXANOL 320 MG/ML IV SOLN
INTRAVENOUS | Status: DC | PRN
  Administered 2022-07-31: 80 mL

## 2022-07-31 MED ORDER — HEPARIN SODIUM (PORCINE) 1000 UNIT/ML IJ SOLN
INTRAMUSCULAR | Status: DC | PRN
  Administered 2022-07-31: 2000 [IU] via INTRAVENOUS

## 2022-07-31 MED ORDER — HEPARIN (PORCINE) IN NACL 1000-0.9 UT/500ML-% IV SOLN
INTRAVENOUS | Status: DC | PRN
  Administered 2022-07-31: 500 mL

## 2022-07-31 MED ORDER — MIDAZOLAM HCL 2 MG/2ML IJ SOLN
INTRAMUSCULAR | Status: DC | PRN
  Administered 2022-07-31: .5 mg via INTRAVENOUS

## 2022-07-31 MED ORDER — HEPARIN SODIUM (PORCINE) 1000 UNIT/ML IJ SOLN
INTRAMUSCULAR | Status: AC
  Filled 2022-07-31: qty 10

## 2022-07-31 MED ORDER — FENTANYL CITRATE (PF) 100 MCG/2ML IJ SOLN
INTRAMUSCULAR | Status: AC
  Filled 2022-07-31: qty 2

## 2022-07-31 MED ORDER — LIDOCAINE HCL (PF) 1 % IJ SOLN
INTRAMUSCULAR | Status: DC | PRN
  Administered 2022-07-31: 10 mL

## 2022-07-31 MED ORDER — LIDOCAINE HCL (PF) 1 % IJ SOLN
INTRAMUSCULAR | Status: AC
  Filled 2022-07-31: qty 30

## 2022-07-31 MED ORDER — FENTANYL CITRATE (PF) 100 MCG/2ML IJ SOLN
INTRAMUSCULAR | Status: DC | PRN
  Administered 2022-07-31 (×3): 25 ug via INTRAVENOUS

## 2022-07-31 MED ORDER — SODIUM CHLORIDE 0.9% FLUSH: 3.0000 mL | INTRAVENOUS | Status: DC | PRN

## 2022-07-31 SURGICAL SUPPLY — 21 items
BAG SNAP BAND KOVER 36X36 (MISCELLANEOUS) ×2 IMPLANT
BALLN IN.PACT DCB 5X40 (BALLOONS) ×2
BALLN IN.PACT DCB 5X60 (BALLOONS) ×4
BALLN MUSTANG 4.0X40 75 (BALLOONS) ×2
BALLOON MUSTANG 4.0X40 75 (BALLOONS) IMPLANT
CATH ANGIO 5F BER2 65CM (CATHETERS) IMPLANT
COVER DOME SNAP 22 D (MISCELLANEOUS) ×2 IMPLANT
DCB IN.PACT 5X40 (BALLOONS) IMPLANT
DCB IN.PACT 5X60 (BALLOONS) IMPLANT
GLIDEWIRE ADV .035X260CM (WIRE) IMPLANT
KIT ENCORE 26 ADVANTAGE (KITS) IMPLANT
KIT MICROPUNCTURE NIT STIFF (SHEATH) IMPLANT
PROTECTION STATION PRESSURIZED (MISCELLANEOUS) ×2
SHEATH PINNACLE R/O II 5F 6CM (SHEATH) IMPLANT
SHEATH PINNACLE R/O II 6F 4CM (SHEATH) IMPLANT
SHEATH PROBE COVER 6X72 (BAG) ×2 IMPLANT
STATION PROTECTION PRESSURIZED (MISCELLANEOUS) ×2 IMPLANT
STOPCOCK MORSE 400PSI 3WAY (MISCELLANEOUS) ×2 IMPLANT
TRAY PV CATH (CUSTOM PROCEDURE TRAY) ×2 IMPLANT
TUBING CIL FLEX 10 FLL-RA (TUBING) ×2 IMPLANT
WIRE G V18X300CM (WIRE) IMPLANT

## 2022-07-31 NOTE — H&P (Signed)
Hospital Consult  MRN #:  [id number]  History of Present Illness: This is a 42 y.o. female with history of end-stage renal disease currently dialyzed through a left-sided Cimino fistula. The fistula has been working well since originally placed in 2018, however recently high pressures have been appreciated with some prolonged bleeding post cannulation.  On exam, Marcia Thornton is doing well, she had no concerns.  She stated the prolonged bleeding is a product of the nurse accessing her.  Prolonged bleeding does not happen after every session.  She denies symptoms of steal syndrome.  She has no left upper extremity swelling, but says she can get swelling if she lays on her left arm at night.  Past Medical History:  Diagnosis Date   Chronic kidney disease    Hypertension     Past Surgical History:  Procedure Laterality Date   AV FISTULA PLACEMENT Left 10/03/2016   Procedure: LEFT ARM ARTERIOVENOUS (AV) FISTULA CREATION;  Surgeon: Elam Dutch, MD;  Location: [facility];  Service: Vascular;  Laterality: Left;   INSERTION OF DIALYSIS CATHETER Right 10/02/2016   Procedure: INSERTION OF RIGHT INTERNAL JUGULAR DIALYSIS CATHETER;  Surgeon: Waynetta Sandy, MD;  Location: [place];  Service: Vascular;  Laterality: Right;    No Known Allergies  Prior to Admission medications   Medication Sig Start Date End Date Taking? Authorizing Provider  amLODipine (NORVASC) 10 MG tablet Take 10 mg by mouth daily.   Yes [provider]  aspirin-acetaminophen-caffeine (EXCEDRIN MIGRAINE) [phone] MG tablet Take 2 tablets by mouth every 6 (six) hours as needed for headache or migraine.   Yes [provider]  carvedilol (COREG) 25 MG tablet Take 25 mg by mouth 2 (two) times daily with a meal.   Yes [provider]  cloNIDine (CATAPRES) 0.3 MG tablet Take 0.3 mg by mouth 3 (three) times daily.   Yes [provider]  hydrALAZINE (APRESOLINE) 100 MG tablet Take 1 tablet (100 mg  total) by mouth 3 (three) times daily. 10/10/16  Yes Hongalgi, Lenis Dickinson, MD  ondansetron (ZOFRAN-ODT) 4 MG disintegrating tablet Take 4 mg by mouth every 8 (eight) hours as needed for nausea or vomiting.    [provider]    Social History   Socioeconomic History   Marital status: Single    Spouse name: Not on file   Number of children: Not on file   Years of education: Not on file   Highest education level: Not on file  Occupational History   Not on file  Tobacco Use   Smoking status: Never   Smokeless tobacco: Never  Substance and Sexual Activity   Alcohol use: No   Drug use: No   Sexual activity: Not on file  Other Topics Concern   Not on file  Social History Narrative   Not on file   Social Determinants of Health   Financial Resource Strain: Not on file  Food Insecurity: Not on file  Transportation Needs: Not on file  Physical Activity: Not on file  Stress: Not on file  Social Connections: Not on file  Intimate Partner Violence: Not on file   No family history on file.  ROS: Otherwise negative unless mentioned in HPI  Physical Examination  Vitals:   07/31/22 0908  BP: (!) 131/94  Pulse: 84  Resp: 14  Temp: (!) 97.2 F (36.2 C)  SpO2: 95%   Body mass index is 25.96 kg/m.  General:  WDWN in NAD Gait: Not observed HENT: WNL, normocephalic  Pulmonary: normal non-labored breathing, without Rales, rhonchi,  wheezing Cardiac: regular Abdomen: soft, NT/ND, no masses Skin: without rashes Vascular Exam/Pulses: 2+ Radial Extremities: without ischemic changes, without Gangrene , without cellulitis; without open wounds;  Musculoskeletal: no muscle wasting or atrophy  Neurologic: A&O X 3;  No focal weakness or paresthesias are detected; speech is fluent/normal Psychiatric:  The pt has Normal affect. Lymph:  Unremarkable  CBC    Component Value Date/Time   WBC 10.6 (H) 10/09/2016 0713   RBC 3.39 (L) 10/09/2016 0713   HGB 8.2 (L) 07/31/2022 0917    HCT 24.0 (L) 07/31/2022 0917   PLT 355 10/09/2016 0713   MCV 82.6 10/09/2016 0713   MCH 28.0 10/09/2016 0713   MCHC 33.9 10/09/2016 0713   RDW 13.0 10/09/2016 0713    BMET    Component Value Date/Time   NA 140 07/31/2022 0917   K 4.3 07/31/2022 0917   CL 100 07/31/2022 0917   CO2 23 10/09/2016 0713   GLUCOSE 102 (H) 07/31/2022 0917   BUN 34 (H) 07/31/2022 0917   CREATININE 9.10 (H) 07/31/2022 0917   CALCIUM 9.2 10/09/2016 0713   GFRNONAA 7 (L) 10/09/2016 0713   GFRAA 8 (L) 10/09/2016 0713    COAGS: Lab Results  Component Value Date   INR 1.12 09/28/2016      ASSESSMENT/PLAN: This is a 42 y.o. female with end-stage renal disease, with fistula malfunction at the left Cimino with high pressures appreciated at dialysis.  I discussed the risk and benefits of left arm fistulogram in an effort to define and treat outflow obstruction to decrease pressures, and decrease the risk of spontaneous bleeding, Gwenyth elected to proceed.    Cassandria Santee MD MS [facility] [phone] 07/31/2022  9:58 AM

## 2022-07-31 NOTE — Op Note (Signed)
Patient name: Marcia Thornton MRN: [id number] DOB: 09-22-1980 Sex: female  07/31/2022 Pre-operative Diagnosis: Fistula malfunction-high pressures at dialysis Post-operative diagnosis:  Same Surgeon:  Broadus John, MD Procedure Performed: 1.  Retrograde access of left radiocephalic fistula using a micropuncture needle 2.  Fistulogram 3.  Balloon angioplasty of the deep brachial vein, cephalic vein, basilic vein using 6 x 40, 6 x 60, 6 x 80 drug-coated balloons 4.  Access managed with suture  Indications:  This is a 42 y.o. female with end-stage renal disease, with fistula malfunction at the left Cimino with high pressures appreciated at dialysis. I discussed the risk and benefits of left arm fistulogram in an effort to define and treat outflow obstruction to decrease pressures, and decrease the risk of spontaneous bleeding, Marcia Thornton elected to proceed.  Findings: Widely patent left radiocephalic fistula with tandem, flow-limiting stenosis greater than 70% appreciated at the antecubital fossa at the cephalic vein, deep brachial vein, basilic vein.  Widely patent anastomosis   Procedure: Patient brought to the Cath Lab and laid in the supine position.  Moderate anesthesia was induced and the patient was prepped draped in standard fashion.  A timeout was performed.  The case began with micropuncture access of the left radiocephalic fistula.  A micro sheath was placed, and fistulogram followed.  There were multiple areas of flow-limiting stenosis outlined above.  I elected to attempt intervention on all 3 outflow vessels.  The patient was heparinized with 3000 units IV heparin, and a 6 French sheath was placed into the fistula.  Next, a guidewire advantage was used to navigate into the deep brachial vein.  A 4 x 40 mm balloon was brought onto the field and inflated.  This demonstrated excellent result, therefore I elected to use a 5 x 40 mm drug-coated balloon.  This was inflated for 2 minutes.   Following inflation, angiogram demonstrated significant improvement in outflow from the brachial vein.  Next, my attention turned to the basilic vein.  There was once again an area of greater than 70% stenosis with what appeared to be an occlusion more centrally.  I used another 5 x 60 drug-coated balloon to address the more proximal lesion.  This demonstrated Out, with follow-up angiography therefore I took the same balloon and moved to the area that appeared to be occluded, and inflated this area as well.  Once again, the balloon inflated easily, and flow was restored.  Finally, I had to use a series of wires and catheters to navigate into the cephalic vein at the level of the antecubital fossa.  Once then, a 4 mm balloon was used for venoplasty.  This was followed by 5 mm x 80 mm drug-coated balloon with an excellent result.  The cephalic vein was ectatic along the upper arm, therefore I used the 5 mm balloon to venoplasty most of the cephalic vein.  Marcia Thornton had trouble tolerating this portion of the case, and therefore it was stopped prematurely.  Follow-up angiography demonstrated significant improvement in luminal gain.  Specifically at the basilic vein and brachial vein where the lesions solved.  There was residual stenosis at the cephalic vein of roughly [id number], however further venoplasty did not create any further luminal gain.  Wires were removed. Access was managed with the use of a Monocryl suture.  Impression: Successful venoplasty of the basilic vein, deep brachial vein, cephalic vein at the level of the antecubital fossa with significant improvement in fistula outflow. Should high pressures continue to be a  problem, patient may require surgical revision of her fistula.    Cassandria Santee, MD [facility] [place] Office: [phone]

## 2022-08-12 ENCOUNTER — Encounter (HOSPITAL_COMMUNITY): Payer: Self-pay | Admitting: Vascular Surgery

## 2024-02-25 ENCOUNTER — Ambulatory Visit: Admitting: Vascular Surgery

## 2024-03-08 ENCOUNTER — Encounter: Admitting: Vascular Surgery

## 2024-03-22 ENCOUNTER — Encounter: Admitting: Vascular Surgery

## 2024-03-22 ENCOUNTER — Ambulatory Visit: Admitting: Vascular Surgery

## 2024-04-21 ENCOUNTER — Ambulatory Visit: Attending: Vascular Surgery | Admitting: Vascular Surgery

## 2024-05-17 NOTE — Progress Notes (Unsigned)
 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  She is known to my service having previously undergone left radiocephalic fistula fistulogram with balloon venoplasty for flow-limiting stenosis in March 2024.  The pt is *** on a statin for cholesterol management.  The pt is *** on a daily aspirin .   Other AC:  *** The pt is *** on medication for hypertension.   The pt is *** diabetic.  Tobacco hx:  ***  Past Medical History:  Diagnosis Date   Chronic kidney disease    Hypertension     Past Surgical History:  Procedure Laterality Date   A/V FISTULAGRAM Left 07/31/2022   Procedure: A/V Fistulagram;  Surgeon: Lanis Fonda BRAVO, MD;  Location: [facility] INVASIVE CV LAB;  Service: Vascular;  Laterality: Left;   AV FISTULA PLACEMENT Left 10/03/2016   Procedure: LEFT ARM ARTERIOVENOUS (AV) FISTULA CREATION;  Surgeon: Harvey Carlin BRAVO, MD;  Location: [facility] OR;  Service: Vascular;  Laterality: Left;   INSERTION OF DIALYSIS CATHETER Right 10/02/2016   Procedure: INSERTION OF RIGHT INTERNAL JUGULAR DIALYSIS CATHETER;  Surgeon: Sheree Penne Bruckner, MD;  Location: [facility] OR;  Service: Vascular;  Laterality: Right;   PERIPHERAL VASCULAR BALLOON ANGIOPLASTY  07/31/2022   Procedure: PERIPHERAL VASCULAR BALLOON ANGIOPLASTY;  Surgeon: Lanis Fonda BRAVO, MD;  Location: [facility] INVASIVE CV LAB;  Service: Vascular;;    Social History   Socioeconomic History   Marital status: Single    Spouse name: Not on file   Number of children: Not on file   Years of education: Not on file   Highest education level: Not on file  Occupational History   Not on file  Tobacco Use   Smoking status: Never   Smokeless tobacco: Never  Substance and Sexual Activity   Alcohol use: No   Drug use: No   Sexual activity: Not  on file  Other Topics Concern   Not on file  Social History Narrative   Not on file   Social Drivers of Health   Tobacco Use: Unknown (05/15/2024)   Received [facility]   Patient History    Smoking Tobacco Use: Never    Smokeless Tobacco Use: Unknown    Passive Exposure: Not on file  Financial Resource Strain: Not on file  Food Insecurity: Not on file  Transportation Needs: Not on file  Physical Activity: Not on file  Stress: Not on file  Social Connections: Not on file  Intimate Partner Violence: Not on file  Depression (EYV7-0): Not on file  Alcohol Screen: Not on file  Housing: Not on file  Utilities: Not on file  Health Literacy: Not on file   ***No family history on file.  Current Outpatient Medications  Medication Sig Dispense Refill   amLODipine  (NORVASC ) 10 MG tablet Take 10 mg by mouth daily.     aspirin -acetaminophen -caffeine  (EXCEDRIN  MIGRAINE) 250-250-65 MG tablet Take 2 tablets by mouth every 6 (six) hours as needed for headache or migraine.     carvedilol  (COREG ) 25 MG tablet Take 25 mg by mouth 2 (two) times daily with a meal.     cloNIDine  (CATAPRES ) 0.3 MG tablet Take 0.3 mg by mouth 3 (three) times daily.     hydrALAZINE  (APRESOLINE ) 100 MG tablet Take 1 tablet (100 mg  total) by mouth 3 (three) times daily. 60 tablet 0   ondansetron  (ZOFRAN -ODT) 4 MG disintegrating tablet Take 4 mg by mouth every 8 (eight) hours as needed for nausea or vomiting.     Current Facility-Administered Medications  Medication Dose Route Frequency Provider Last Rate Last Admin   0.9 %  sodium chloride  infusion  250 mL Intravenous PRN Gretta Lonni PARAS, MD       sodium chloride  flush (NS) 0.9 % injection 3 mL  3 mL Intravenous Q12H Gretta Lonni PARAS, MD        Allergies[1]   REVIEW OF SYSTEMS:  *** [X]  denotes positive finding, [ ]  denotes negative finding Cardiac  Comments:  Chest pain or chest pressure:    Shortness of breath upon exertion:    Short of  breath when lying flat:    Irregular heart rhythm:        Vascular    Pain in calf, thigh, or hip brought on by ambulation:    Pain in feet at night that wakes you up from your sleep:     Blood clot in your veins:    Leg swelling:         Pulmonary    Oxygen at home:    Productive cough:     Wheezing:         Neurologic    Sudden weakness in arms or legs:     Sudden numbness in arms or legs:     Sudden onset of difficulty speaking or slurred speech:    Temporary loss of vision in one eye:     Problems with dizziness:         Gastrointestinal    Blood in stool:     Vomited blood:         Genitourinary    Burning when urinating:     Blood in urine:        Psychiatric    Major depression:         Hematologic    Bleeding problems:    Problems with blood clotting too easily:        Skin    Rashes or ulcers:        Constitutional    Fever or chills:      PHYSICAL EXAMINATION:  There were no vitals filed for this visit.  General:  WDWN in NAD; vital signs documented above Gait: Not observed HENT: WNL, normocephalic Pulmonary: normal non-labored breathing , without wheezing Cardiac: {Desc; regular/irreg:14544} HR Abdomen: soft, NT, no masses Skin: {With/Without:20273} rashes Vascular Exam/Pulses:  Right Left  Radial {Exam; arterial pulse strength 0-4:30167} {Exam; arterial pulse strength 0-4:30167}  Ulnar {Exam; arterial pulse strength 0-4:30167} {Exam; arterial pulse strength 0-4:30167}  Femoral {Exam; arterial pulse strength 0-4:30167} {Exam; arterial pulse strength 0-4:30167}  Popliteal {Exam; arterial pulse strength 0-4:30167} {Exam; arterial pulse strength 0-4:30167}  DP {Exam; arterial pulse strength 0-4:30167} {Exam; arterial pulse strength 0-4:30167}  PT {Exam; arterial pulse strength 0-4:30167} {Exam; arterial pulse strength 0-4:30167}   Extremities: {With/Without:20273} ischemic changes, {With/Without:20273} Gangrene , {With/Without:20273} cellulitis;  {With/Without:20273} open wounds;  Musculoskeletal: no muscle wasting or atrophy  Neurologic: A&O X 3;  No focal weakness or paresthesias are detected Psychiatric:  The pt has {Desc; normal/abnormal:11317::Normal} affect.   Non-Invasive Vascular Imaging:   ***    ASSESSMENT/PLAN: Joclynn Lumb is a 44 y.o. female presenting with ***   ***   Fonda FORBES Rim, MD [facility] [phone]     [1] No  Known Allergies  "

## 2024-05-19 ENCOUNTER — Ambulatory Visit: Attending: Vascular Surgery | Admitting: Vascular Surgery
